# Patient Record
Sex: Male | Born: 1945
Health system: Southern US, Community
[De-identification: ages and names within clinical notes are randomized; demographics above are authoritative.]

## PROBLEM LIST (undated history)

## (undated) DIAGNOSIS — K219 Gastro-esophageal reflux disease without esophagitis: Secondary | ICD-10-CM

## (undated) DIAGNOSIS — F32A Depression, unspecified: Secondary | ICD-10-CM

## (undated) DIAGNOSIS — I2510 Atherosclerotic heart disease of native coronary artery without angina pectoris: Secondary | ICD-10-CM

## (undated) DIAGNOSIS — R9439 Abnormal result of other cardiovascular function study: Secondary | ICD-10-CM

## (undated) DIAGNOSIS — C801 Malignant (primary) neoplasm, unspecified: Secondary | ICD-10-CM

## (undated) DIAGNOSIS — I340 Nonrheumatic mitral (valve) insufficiency: Secondary | ICD-10-CM

## (undated) DIAGNOSIS — I10 Essential (primary) hypertension: Secondary | ICD-10-CM

## (undated) DIAGNOSIS — G4730 Sleep apnea, unspecified: Secondary | ICD-10-CM

## (undated) DIAGNOSIS — N183 Chronic kidney disease, stage 3 unspecified: Secondary | ICD-10-CM

## (undated) DIAGNOSIS — E782 Mixed hyperlipidemia: Secondary | ICD-10-CM

## (undated) DIAGNOSIS — E785 Hyperlipidemia, unspecified: Secondary | ICD-10-CM

## (undated) DIAGNOSIS — N200 Calculus of kidney: Secondary | ICD-10-CM

## (undated) DIAGNOSIS — R079 Chest pain, unspecified: Secondary | ICD-10-CM

## (undated) DIAGNOSIS — T4145XA Adverse effect of unspecified anesthetic, initial encounter: Secondary | ICD-10-CM

## (undated) DIAGNOSIS — F329 Major depressive disorder, single episode, unspecified: Secondary | ICD-10-CM

## (undated) DIAGNOSIS — Z87442 Personal history of urinary calculi: Secondary | ICD-10-CM

## (undated) HISTORY — DX: Essential (primary) hypertension: I10

## (undated) HISTORY — DX: Chest pain, unspecified: R07.9

## (undated) HISTORY — DX: Nonrheumatic mitral (valve) insufficiency: I34.0

## (undated) HISTORY — DX: Mixed hyperlipidemia: E78.2

## (undated) HISTORY — PX: CHOLECYSTECTOMY: SHX55

## (undated) HISTORY — DX: Atherosclerotic heart disease of native coronary artery without angina pectoris: I25.10

## (undated) HISTORY — DX: Abnormal result of other cardiovascular function study: R94.39

## (undated) HISTORY — DX: Major depressive disorder, single episode, unspecified: F32.9

## (undated) HISTORY — PX: EXTRACORPOREAL SHOCK WAVE LITHOTRIPSY: SHX1557

## (undated) HISTORY — PX: TONSILLECTOMY: SUR1361

## (undated) HISTORY — DX: Depression, unspecified: F32.A

## (undated) HISTORY — DX: Hyperlipidemia, unspecified: E78.5

---

## 1966-11-19 HISTORY — PX: FOREIGN BODY REMOVAL: SHX962

## 1999-11-20 DIAGNOSIS — C801 Malignant (primary) neoplasm, unspecified: Secondary | ICD-10-CM

## 1999-11-20 HISTORY — DX: Malignant (primary) neoplasm, unspecified: C80.1

## 2008-11-19 HISTORY — PX: COLONOSCOPY: SHX174

## 2012-01-03 DIAGNOSIS — Z0000 Encounter for general adult medical examination without abnormal findings: Secondary | ICD-10-CM | POA: Diagnosis not present

## 2012-01-03 DIAGNOSIS — B079 Viral wart, unspecified: Secondary | ICD-10-CM | POA: Diagnosis not present

## 2012-01-03 DIAGNOSIS — E785 Hyperlipidemia, unspecified: Secondary | ICD-10-CM | POA: Diagnosis not present

## 2012-03-17 DIAGNOSIS — L570 Actinic keratosis: Secondary | ICD-10-CM | POA: Diagnosis not present

## 2012-03-17 DIAGNOSIS — Z85828 Personal history of other malignant neoplasm of skin: Secondary | ICD-10-CM | POA: Diagnosis not present

## 2012-03-17 DIAGNOSIS — B079 Viral wart, unspecified: Secondary | ICD-10-CM | POA: Diagnosis not present

## 2012-04-08 DIAGNOSIS — B079 Viral wart, unspecified: Secondary | ICD-10-CM | POA: Diagnosis not present

## 2012-04-18 ENCOUNTER — Ambulatory Visit: Payer: Self-pay | Admitting: Emergency Medicine

## 2012-04-18 DIAGNOSIS — R55 Syncope and collapse: Secondary | ICD-10-CM | POA: Diagnosis not present

## 2012-04-18 DIAGNOSIS — N390 Urinary tract infection, site not specified: Secondary | ICD-10-CM | POA: Diagnosis not present

## 2012-04-18 LAB — URINALYSIS, COMPLETE
Bilirubin,UR: NEGATIVE
Glucose,UR: NEGATIVE mg/dL (ref 0–75)
Ketone: NEGATIVE
Leukocyte Esterase: NEGATIVE
Nitrite: NEGATIVE
Ph: 6.5 (ref 4.5–8.0)
Protein: 30
Specific Gravity: 1.02 (ref 1.003–1.030)

## 2012-04-18 LAB — COMPREHENSIVE METABOLIC PANEL
Albumin: 4.2 g/dL (ref 3.4–5.0)
Alkaline Phosphatase: 53 U/L (ref 50–136)
Anion Gap: 9 (ref 7–16)
BUN: 19 mg/dL — ABNORMAL HIGH (ref 7–18)
Bilirubin,Total: 0.8 mg/dL (ref 0.2–1.0)
Calcium, Total: 9.6 mg/dL (ref 8.5–10.1)
Chloride: 104 mmol/L (ref 98–107)
Co2: 26 mmol/L (ref 21–32)
Creatinine: 1.71 mg/dL — ABNORMAL HIGH (ref 0.60–1.30)
EGFR (African American): 47 — ABNORMAL LOW
EGFR (Non-African Amer.): 41 — ABNORMAL LOW
Glucose: 122 mg/dL — ABNORMAL HIGH (ref 65–99)
Osmolality: 281 (ref 275–301)
Potassium: 4.6 mmol/L (ref 3.5–5.1)
SGOT(AST): 38 U/L — ABNORMAL HIGH (ref 15–37)
SGPT (ALT): 45 U/L
Sodium: 139 mmol/L (ref 136–145)
Total Protein: 7.3 g/dL (ref 6.4–8.2)

## 2012-04-18 LAB — CBC WITH DIFFERENTIAL/PLATELET
Basophil #: 0 10*3/uL (ref 0.0–0.1)
Basophil %: 0.3 %
Eosinophil #: 0.4 10*3/uL (ref 0.0–0.7)
Eosinophil %: 5.5 %
HCT: 45.3 % (ref 40.0–52.0)
HGB: 15.5 g/dL (ref 13.0–18.0)
Lymphocyte #: 2.2 10*3/uL (ref 1.0–3.6)
Lymphocyte %: 27.5 %
MCH: 30 pg (ref 26.0–34.0)
MCHC: 34.3 g/dL (ref 32.0–36.0)
MCV: 88 fL (ref 80–100)
Monocyte #: 0.7 x10 3/mm (ref 0.2–1.0)
Monocyte %: 8.8 %
Neutrophil #: 4.6 10*3/uL (ref 1.4–6.5)
Neutrophil %: 57.9 %
Platelet: 156 10*3/uL (ref 150–440)
RBC: 5.18 10*6/uL (ref 4.40–5.90)
RDW: 13.2 % (ref 11.5–14.5)
WBC: 8 10*3/uL (ref 3.8–10.6)

## 2012-04-20 LAB — URINE CULTURE

## 2012-04-25 DIAGNOSIS — R55 Syncope and collapse: Secondary | ICD-10-CM | POA: Diagnosis not present

## 2012-04-25 DIAGNOSIS — E785 Hyperlipidemia, unspecified: Secondary | ICD-10-CM | POA: Diagnosis not present

## 2012-04-25 DIAGNOSIS — M549 Dorsalgia, unspecified: Secondary | ICD-10-CM | POA: Diagnosis not present

## 2012-04-29 DIAGNOSIS — B070 Plantar wart: Secondary | ICD-10-CM | POA: Diagnosis not present

## 2012-05-07 DIAGNOSIS — I119 Hypertensive heart disease without heart failure: Secondary | ICD-10-CM | POA: Diagnosis not present

## 2012-05-07 DIAGNOSIS — I519 Heart disease, unspecified: Secondary | ICD-10-CM | POA: Diagnosis not present

## 2012-05-07 DIAGNOSIS — E782 Mixed hyperlipidemia: Secondary | ICD-10-CM | POA: Diagnosis not present

## 2012-05-07 DIAGNOSIS — R55 Syncope and collapse: Secondary | ICD-10-CM | POA: Diagnosis not present

## 2012-05-16 DIAGNOSIS — R55 Syncope and collapse: Secondary | ICD-10-CM | POA: Diagnosis not present

## 2012-06-09 DIAGNOSIS — Z79899 Other long term (current) drug therapy: Secondary | ICD-10-CM | POA: Diagnosis not present

## 2012-06-09 DIAGNOSIS — E785 Hyperlipidemia, unspecified: Secondary | ICD-10-CM | POA: Diagnosis not present

## 2012-09-12 DIAGNOSIS — Z79899 Other long term (current) drug therapy: Secondary | ICD-10-CM | POA: Diagnosis not present

## 2012-09-12 DIAGNOSIS — E785 Hyperlipidemia, unspecified: Secondary | ICD-10-CM | POA: Diagnosis not present

## 2012-09-12 DIAGNOSIS — G4700 Insomnia, unspecified: Secondary | ICD-10-CM | POA: Diagnosis not present

## 2012-09-12 DIAGNOSIS — F329 Major depressive disorder, single episode, unspecified: Secondary | ICD-10-CM | POA: Diagnosis not present

## 2012-09-15 ENCOUNTER — Ambulatory Visit: Payer: Self-pay | Admitting: Family Medicine

## 2012-09-15 DIAGNOSIS — R109 Unspecified abdominal pain: Secondary | ICD-10-CM | POA: Diagnosis not present

## 2012-09-15 DIAGNOSIS — N200 Calculus of kidney: Secondary | ICD-10-CM | POA: Diagnosis not present

## 2012-09-15 DIAGNOSIS — N201 Calculus of ureter: Secondary | ICD-10-CM | POA: Diagnosis not present

## 2012-09-15 DIAGNOSIS — R310 Gross hematuria: Secondary | ICD-10-CM | POA: Diagnosis not present

## 2012-09-15 DIAGNOSIS — Z79899 Other long term (current) drug therapy: Secondary | ICD-10-CM | POA: Diagnosis not present

## 2012-09-15 DIAGNOSIS — E785 Hyperlipidemia, unspecified: Secondary | ICD-10-CM | POA: Diagnosis not present

## 2012-09-25 ENCOUNTER — Ambulatory Visit: Payer: Self-pay

## 2012-09-25 DIAGNOSIS — K5730 Diverticulosis of large intestine without perforation or abscess without bleeding: Secondary | ICD-10-CM | POA: Diagnosis not present

## 2012-09-25 DIAGNOSIS — N201 Calculus of ureter: Secondary | ICD-10-CM | POA: Diagnosis not present

## 2012-09-25 DIAGNOSIS — N1330 Unspecified hydronephrosis: Secondary | ICD-10-CM | POA: Diagnosis not present

## 2012-09-25 DIAGNOSIS — Z9089 Acquired absence of other organs: Secondary | ICD-10-CM | POA: Diagnosis not present

## 2012-09-25 DIAGNOSIS — Q619 Cystic kidney disease, unspecified: Secondary | ICD-10-CM | POA: Diagnosis not present

## 2012-09-30 DIAGNOSIS — N200 Calculus of kidney: Secondary | ICD-10-CM | POA: Diagnosis not present

## 2012-09-30 DIAGNOSIS — N201 Calculus of ureter: Secondary | ICD-10-CM | POA: Diagnosis not present

## 2012-10-02 ENCOUNTER — Ambulatory Visit: Payer: Self-pay | Admitting: Urology

## 2012-10-02 DIAGNOSIS — E785 Hyperlipidemia, unspecified: Secondary | ICD-10-CM | POA: Diagnosis not present

## 2012-10-02 DIAGNOSIS — Z79899 Other long term (current) drug therapy: Secondary | ICD-10-CM | POA: Diagnosis not present

## 2012-10-02 DIAGNOSIS — Z87891 Personal history of nicotine dependence: Secondary | ICD-10-CM | POA: Diagnosis not present

## 2012-10-02 DIAGNOSIS — R55 Syncope and collapse: Secondary | ICD-10-CM | POA: Diagnosis not present

## 2012-10-02 DIAGNOSIS — N200 Calculus of kidney: Secondary | ICD-10-CM | POA: Diagnosis not present

## 2012-10-02 DIAGNOSIS — N201 Calculus of ureter: Secondary | ICD-10-CM | POA: Diagnosis not present

## 2012-10-08 ENCOUNTER — Ambulatory Visit: Payer: Self-pay | Admitting: Urology

## 2012-10-08 DIAGNOSIS — N201 Calculus of ureter: Secondary | ICD-10-CM | POA: Diagnosis not present

## 2012-10-08 DIAGNOSIS — N200 Calculus of kidney: Secondary | ICD-10-CM | POA: Diagnosis not present

## 2012-10-08 DIAGNOSIS — N1330 Unspecified hydronephrosis: Secondary | ICD-10-CM | POA: Diagnosis not present

## 2012-10-14 DIAGNOSIS — N200 Calculus of kidney: Secondary | ICD-10-CM | POA: Diagnosis not present

## 2012-11-03 DIAGNOSIS — N200 Calculus of kidney: Secondary | ICD-10-CM | POA: Diagnosis not present

## 2013-03-04 DIAGNOSIS — N200 Calculus of kidney: Secondary | ICD-10-CM | POA: Diagnosis not present

## 2013-04-02 DIAGNOSIS — E860 Dehydration: Secondary | ICD-10-CM | POA: Diagnosis not present

## 2013-04-02 DIAGNOSIS — K5289 Other specified noninfective gastroenteritis and colitis: Secondary | ICD-10-CM | POA: Diagnosis not present

## 2013-04-28 DIAGNOSIS — I10 Essential (primary) hypertension: Secondary | ICD-10-CM | POA: Diagnosis not present

## 2013-04-28 DIAGNOSIS — R9431 Abnormal electrocardiogram [ECG] [EKG]: Secondary | ICD-10-CM | POA: Diagnosis not present

## 2013-04-28 DIAGNOSIS — E785 Hyperlipidemia, unspecified: Secondary | ICD-10-CM | POA: Diagnosis not present

## 2013-06-01 DIAGNOSIS — Z85828 Personal history of other malignant neoplasm of skin: Secondary | ICD-10-CM | POA: Diagnosis not present

## 2013-06-01 DIAGNOSIS — Z1283 Encounter for screening for malignant neoplasm of skin: Secondary | ICD-10-CM | POA: Diagnosis not present

## 2013-06-01 DIAGNOSIS — L570 Actinic keratosis: Secondary | ICD-10-CM | POA: Diagnosis not present

## 2013-07-07 DIAGNOSIS — Z125 Encounter for screening for malignant neoplasm of prostate: Secondary | ICD-10-CM | POA: Diagnosis not present

## 2013-07-07 DIAGNOSIS — N201 Calculus of ureter: Secondary | ICD-10-CM | POA: Diagnosis not present

## 2013-07-07 DIAGNOSIS — N200 Calculus of kidney: Secondary | ICD-10-CM | POA: Diagnosis not present

## 2013-07-29 DIAGNOSIS — L570 Actinic keratosis: Secondary | ICD-10-CM | POA: Diagnosis not present

## 2013-08-28 DIAGNOSIS — N200 Calculus of kidney: Secondary | ICD-10-CM | POA: Diagnosis not present

## 2013-11-05 DIAGNOSIS — R3129 Other microscopic hematuria: Secondary | ICD-10-CM | POA: Diagnosis not present

## 2013-11-05 DIAGNOSIS — N200 Calculus of kidney: Secondary | ICD-10-CM | POA: Diagnosis not present

## 2013-11-05 DIAGNOSIS — N400 Enlarged prostate without lower urinary tract symptoms: Secondary | ICD-10-CM | POA: Diagnosis not present

## 2013-11-10 ENCOUNTER — Ambulatory Visit: Payer: Self-pay | Admitting: Urology

## 2013-11-10 DIAGNOSIS — R319 Hematuria, unspecified: Secondary | ICD-10-CM | POA: Diagnosis not present

## 2013-11-10 DIAGNOSIS — N200 Calculus of kidney: Secondary | ICD-10-CM | POA: Diagnosis not present

## 2013-11-24 DIAGNOSIS — R3129 Other microscopic hematuria: Secondary | ICD-10-CM | POA: Diagnosis not present

## 2013-11-24 DIAGNOSIS — N200 Calculus of kidney: Secondary | ICD-10-CM | POA: Diagnosis not present

## 2013-12-01 DIAGNOSIS — L821 Other seborrheic keratosis: Secondary | ICD-10-CM | POA: Diagnosis not present

## 2013-12-01 DIAGNOSIS — L738 Other specified follicular disorders: Secondary | ICD-10-CM | POA: Diagnosis not present

## 2013-12-01 DIAGNOSIS — D485 Neoplasm of uncertain behavior of skin: Secondary | ICD-10-CM | POA: Diagnosis not present

## 2013-12-01 DIAGNOSIS — L570 Actinic keratosis: Secondary | ICD-10-CM | POA: Diagnosis not present

## 2014-01-19 DIAGNOSIS — E785 Hyperlipidemia, unspecified: Secondary | ICD-10-CM | POA: Diagnosis not present

## 2014-01-19 DIAGNOSIS — F329 Major depressive disorder, single episode, unspecified: Secondary | ICD-10-CM | POA: Diagnosis not present

## 2014-01-19 DIAGNOSIS — F3289 Other specified depressive episodes: Secondary | ICD-10-CM | POA: Diagnosis not present

## 2014-01-19 DIAGNOSIS — Z23 Encounter for immunization: Secondary | ICD-10-CM | POA: Diagnosis not present

## 2014-02-03 DIAGNOSIS — R319 Hematuria, unspecified: Secondary | ICD-10-CM | POA: Diagnosis not present

## 2014-03-26 DIAGNOSIS — R51 Headache: Secondary | ICD-10-CM | POA: Diagnosis not present

## 2014-04-20 DIAGNOSIS — I059 Rheumatic mitral valve disease, unspecified: Secondary | ICD-10-CM | POA: Diagnosis not present

## 2014-04-20 DIAGNOSIS — I340 Nonrheumatic mitral (valve) insufficiency: Secondary | ICD-10-CM

## 2014-04-20 DIAGNOSIS — R9439 Abnormal result of other cardiovascular function study: Secondary | ICD-10-CM | POA: Diagnosis not present

## 2014-04-20 DIAGNOSIS — E785 Hyperlipidemia, unspecified: Secondary | ICD-10-CM | POA: Diagnosis not present

## 2014-04-20 HISTORY — DX: Nonrheumatic mitral (valve) insufficiency: I34.0

## 2014-06-01 DIAGNOSIS — Z1283 Encounter for screening for malignant neoplasm of skin: Secondary | ICD-10-CM | POA: Diagnosis not present

## 2014-06-01 DIAGNOSIS — Z85828 Personal history of other malignant neoplasm of skin: Secondary | ICD-10-CM | POA: Diagnosis not present

## 2014-06-01 DIAGNOSIS — L570 Actinic keratosis: Secondary | ICD-10-CM | POA: Diagnosis not present

## 2014-07-21 DIAGNOSIS — H269 Unspecified cataract: Secondary | ICD-10-CM | POA: Diagnosis not present

## 2014-10-19 DIAGNOSIS — T8859XA Other complications of anesthesia, initial encounter: Secondary | ICD-10-CM

## 2014-10-19 HISTORY — PX: CARDIAC CATHETERIZATION: SHX172

## 2014-10-19 HISTORY — DX: Other complications of anesthesia, initial encounter: T88.59XA

## 2014-10-25 DIAGNOSIS — I340 Nonrheumatic mitral (valve) insufficiency: Secondary | ICD-10-CM | POA: Diagnosis not present

## 2014-10-25 DIAGNOSIS — R079 Chest pain, unspecified: Secondary | ICD-10-CM | POA: Diagnosis not present

## 2014-10-25 DIAGNOSIS — E782 Mixed hyperlipidemia: Secondary | ICD-10-CM | POA: Diagnosis not present

## 2014-11-03 DIAGNOSIS — I209 Angina pectoris, unspecified: Secondary | ICD-10-CM | POA: Diagnosis not present

## 2014-11-03 DIAGNOSIS — R9439 Abnormal result of other cardiovascular function study: Secondary | ICD-10-CM | POA: Diagnosis not present

## 2014-11-03 DIAGNOSIS — R079 Chest pain, unspecified: Secondary | ICD-10-CM

## 2014-11-03 DIAGNOSIS — E782 Mixed hyperlipidemia: Secondary | ICD-10-CM | POA: Diagnosis not present

## 2014-11-03 HISTORY — DX: Chest pain, unspecified: R07.9

## 2014-11-10 ENCOUNTER — Ambulatory Visit: Payer: Self-pay | Admitting: Internal Medicine

## 2014-11-10 DIAGNOSIS — E782 Mixed hyperlipidemia: Secondary | ICD-10-CM | POA: Diagnosis not present

## 2014-11-10 DIAGNOSIS — I10 Essential (primary) hypertension: Secondary | ICD-10-CM | POA: Diagnosis not present

## 2014-11-10 DIAGNOSIS — I25110 Atherosclerotic heart disease of native coronary artery with unstable angina pectoris: Secondary | ICD-10-CM | POA: Diagnosis not present

## 2014-11-10 DIAGNOSIS — I209 Angina pectoris, unspecified: Secondary | ICD-10-CM | POA: Diagnosis not present

## 2014-11-10 DIAGNOSIS — I25119 Atherosclerotic heart disease of native coronary artery with unspecified angina pectoris: Secondary | ICD-10-CM | POA: Diagnosis not present

## 2014-11-10 DIAGNOSIS — R9439 Abnormal result of other cardiovascular function study: Secondary | ICD-10-CM | POA: Diagnosis not present

## 2014-11-10 DIAGNOSIS — R9430 Abnormal result of cardiovascular function study, unspecified: Secondary | ICD-10-CM | POA: Diagnosis not present

## 2014-11-10 DIAGNOSIS — I25118 Atherosclerotic heart disease of native coronary artery with other forms of angina pectoris: Secondary | ICD-10-CM | POA: Diagnosis not present

## 2014-11-10 DIAGNOSIS — Z8249 Family history of ischemic heart disease and other diseases of the circulatory system: Secondary | ICD-10-CM | POA: Diagnosis not present

## 2014-11-10 DIAGNOSIS — I259 Chronic ischemic heart disease, unspecified: Secondary | ICD-10-CM | POA: Diagnosis not present

## 2014-11-10 DIAGNOSIS — I200 Unstable angina: Secondary | ICD-10-CM | POA: Diagnosis not present

## 2014-11-10 LAB — CK-MB: CK-MB: 1 ng/mL (ref 0.5–3.6)

## 2014-11-11 DIAGNOSIS — R9439 Abnormal result of other cardiovascular function study: Secondary | ICD-10-CM | POA: Diagnosis not present

## 2014-11-11 DIAGNOSIS — R51 Headache: Secondary | ICD-10-CM | POA: Diagnosis not present

## 2014-11-11 DIAGNOSIS — I259 Chronic ischemic heart disease, unspecified: Secondary | ICD-10-CM | POA: Diagnosis not present

## 2014-11-11 DIAGNOSIS — I10 Essential (primary) hypertension: Secondary | ICD-10-CM | POA: Diagnosis not present

## 2014-11-11 DIAGNOSIS — E782 Mixed hyperlipidemia: Secondary | ICD-10-CM | POA: Diagnosis not present

## 2014-11-11 DIAGNOSIS — I25118 Atherosclerotic heart disease of native coronary artery with other forms of angina pectoris: Secondary | ICD-10-CM | POA: Diagnosis not present

## 2014-11-11 DIAGNOSIS — Z8249 Family history of ischemic heart disease and other diseases of the circulatory system: Secondary | ICD-10-CM | POA: Diagnosis not present

## 2014-11-11 LAB — BASIC METABOLIC PANEL
ANION GAP: 11 (ref 7–16)
BUN: 15 mg/dL (ref 7–18)
CREATININE: 1.61 mg/dL — AB (ref 0.60–1.30)
Calcium, Total: 8.7 mg/dL (ref 8.5–10.1)
Chloride: 104 mmol/L (ref 98–107)
Co2: 21 mmol/L (ref 21–32)
EGFR (African American): 55 — ABNORMAL LOW
GFR CALC NON AF AMER: 45 — AB
GLUCOSE: 143 mg/dL — AB (ref 65–99)
OSMOLALITY: 275 (ref 275–301)
Potassium: 3.4 mmol/L — ABNORMAL LOW (ref 3.5–5.1)
Sodium: 136 mmol/L (ref 136–145)

## 2014-11-16 DIAGNOSIS — I10 Essential (primary) hypertension: Secondary | ICD-10-CM | POA: Diagnosis not present

## 2014-11-16 DIAGNOSIS — E782 Mixed hyperlipidemia: Secondary | ICD-10-CM | POA: Diagnosis not present

## 2014-11-16 DIAGNOSIS — I2510 Atherosclerotic heart disease of native coronary artery without angina pectoris: Secondary | ICD-10-CM | POA: Insufficient documentation

## 2014-11-16 HISTORY — DX: Atherosclerotic heart disease of native coronary artery without angina pectoris: I25.10

## 2014-11-16 HISTORY — DX: Essential (primary) hypertension: I10

## 2014-12-16 DIAGNOSIS — E782 Mixed hyperlipidemia: Secondary | ICD-10-CM | POA: Diagnosis not present

## 2014-12-16 DIAGNOSIS — I10 Essential (primary) hypertension: Secondary | ICD-10-CM | POA: Diagnosis not present

## 2014-12-16 DIAGNOSIS — I2510 Atherosclerotic heart disease of native coronary artery without angina pectoris: Secondary | ICD-10-CM | POA: Diagnosis not present

## 2014-12-23 DIAGNOSIS — E782 Mixed hyperlipidemia: Secondary | ICD-10-CM | POA: Diagnosis not present

## 2015-02-23 DIAGNOSIS — E784 Other hyperlipidemia: Secondary | ICD-10-CM | POA: Diagnosis not present

## 2015-02-23 DIAGNOSIS — N400 Enlarged prostate without lower urinary tract symptoms: Secondary | ICD-10-CM | POA: Diagnosis not present

## 2015-02-23 DIAGNOSIS — I2510 Atherosclerotic heart disease of native coronary artery without angina pectoris: Secondary | ICD-10-CM | POA: Diagnosis not present

## 2015-02-23 DIAGNOSIS — Z79899 Other long term (current) drug therapy: Secondary | ICD-10-CM | POA: Diagnosis not present

## 2015-02-23 DIAGNOSIS — Z955 Presence of coronary angioplasty implant and graft: Secondary | ICD-10-CM | POA: Diagnosis not present

## 2015-02-23 DIAGNOSIS — F339 Major depressive disorder, recurrent, unspecified: Secondary | ICD-10-CM | POA: Diagnosis not present

## 2015-02-23 DIAGNOSIS — Z0000 Encounter for general adult medical examination without abnormal findings: Secondary | ICD-10-CM | POA: Diagnosis not present

## 2015-02-23 DIAGNOSIS — Z1389 Encounter for screening for other disorder: Secondary | ICD-10-CM | POA: Diagnosis not present

## 2015-03-12 NOTE — Discharge Summary (Signed)
PATIENT NAME:  Luis Strong, Luis Strong MR#:  [id number] DATE OF BIRTH:  10-27-1946  DATE OF ADMISSION:  11/10/2014 DATE OF DISCHARGE:  11/11/2014  DISCHARGE DIAGNOSES:   1.  Stable  angina ischemic in nature.  2.  Coronary artery disease with stable angina.  3.  Mixed hyperlipidemia.  4.  Essential hypertension.   HISTORY OF PRESENT ILLNESS: This 69 year old male with mixed hyperlipidemia, essential hypertension on appropriate medications with progressive shortness of breath with physical activity as well as chest pressure consistent with ischemic chest discomfort and [place] class III angina for which he had a stress test, which was high risk, result specifically showing abnormal treadmill score, abnormal myocardial ischemia in the anterior distribution of the myocardium and underwent catheterization showing a 99% stenosis of the left anterior descending artery in the mid section who underwent a PCI and drug-eluting stent of the left anterior descending artery without complication. He was placed on appropriate medication management and ambulated without further chest discomfort and reached his maximal hospital benefit and was discharged to home in good condition.   DISCHARGE MEDICATIONS: Aspirin 81 mg each day, Plavix 75 mg each day, citalopram 10 mg each day, simvastatin 40 mg each day, and metoprolol succinate 25 mg each day.   DISCHARGE INSTRUCTIONS: He is to perform rehabilitation for which we have discussed and recommended at this time and follow up with Dr. Serafina Royals in one week.      ____________________________ Corey Skains, MD bjk:at D: 11/11/2014 07:48:01 ET T: 11/11/2014 09:42:57 ET JOB#: [id number]  cc: Corey Skains, MD, <Dictator> Corey Skains MD ELECTRONICALLY SIGNED 11/17/2014 13:47

## 2015-03-12 NOTE — Consult Note (Signed)
PATIENT NAME:  Luis Strong, Luis Strong MR#:  [id number] DATE OF BIRTH:  [birth year]  DATE OF CONSULTATION:  11/11/2014  REFERRING PHYSICIAN:   CONSULTING PHYSICIAN:  Leotis Pain, MD  REASON FOR CONSULTATION: Headache.   HISTORY OF PRESENT ILLNESS: This is a 69 year old gentleman with past medical history of hyperlipidemia, hypertension, admitted with angina and shortness of breath, found to have 99% stenosis of left anterior descending artery, status post PCI drug-eluting stent, placed on dual antiplatelet therapy, aspirin, and Plavix. Neurology consulted for headache. The patient stated the headache is improved, but it is located in the frontal region. Occasional nausea, no vomiting. No photophobia. No phonophobia. The patient has a history of headaches, but they are in frequent. As explained earlier, currently the symptoms have improved. The patient received Fioricet earlier.   LABORATORY: Has been reviewed.   REVIEW OF SYSTEMS: Positive only for headache.   NEUROLOGIC EXAMINATION: The patient tells me date, time, the name of the hospital, and the reason why he is in the hospital. Facial sensation intact. Facial motor is intact. Tongue is midline. Uvula elevates symmetrically. Shoulder shrug is intact. Motor strength 5/5 in bilateral upper and lower extremities. Sensation intact to light touch and temperature. Coordination intact. Finger-to-nose intact.   IMPRESSION: A 69 year old gentleman with past medical history of hypertension, hyperlipidemia, left anterior descending artery 99% stenosis, status post PCI and stenting, is now on Plavix and aspirin, complaining of a frontal headache with no radiation.   PLAN: At this point, I do not think the patient should receive Fioricet. Fioricet can cause a lot of rebound headaches. I agree with caffeine p.r.n. status post 2 grams of magnesium sulfate. At home if headaches persist, would start daily 500 mg of magnesium and a daily over-the-counter vitamin B  complex. No further imaging at this point. The case was discussed with the patient. It was a pleasure seeing this patient. Thank you and please call with any questions.    ____________________________ Leotis Pain, MD yz:at D: 11/11/2014 13:44:41 ET T: 11/11/2014 14:49:40 ET JOB#: [id number]  cc: Leotis Pain, MD, <Dictator> Leotis Pain MD ELECTRONICALLY SIGNED 11/17/2014 13:50

## 2015-04-12 DIAGNOSIS — E782 Mixed hyperlipidemia: Secondary | ICD-10-CM | POA: Diagnosis not present

## 2015-04-12 DIAGNOSIS — I10 Essential (primary) hypertension: Secondary | ICD-10-CM | POA: Diagnosis not present

## 2015-04-12 DIAGNOSIS — I2510 Atherosclerotic heart disease of native coronary artery without angina pectoris: Secondary | ICD-10-CM | POA: Diagnosis not present

## 2015-04-24 ENCOUNTER — Other Ambulatory Visit: Payer: Self-pay | Admitting: Family Medicine

## 2015-04-24 DIAGNOSIS — F329 Major depressive disorder, single episode, unspecified: Secondary | ICD-10-CM

## 2015-04-24 DIAGNOSIS — F32A Depression, unspecified: Secondary | ICD-10-CM

## 2015-04-26 ENCOUNTER — Other Ambulatory Visit: Payer: Self-pay

## 2015-04-26 DIAGNOSIS — F329 Major depressive disorder, single episode, unspecified: Secondary | ICD-10-CM

## 2015-04-26 DIAGNOSIS — F32A Depression, unspecified: Secondary | ICD-10-CM

## 2015-04-26 MED ORDER — CITALOPRAM HYDROBROMIDE 10 MG PO TABS: 10.0000 mg | ORAL_TABLET | Freq: Every day | ORAL | Status: DC

## 2015-05-31 DIAGNOSIS — Z872 Personal history of diseases of the skin and subcutaneous tissue: Secondary | ICD-10-CM | POA: Diagnosis not present

## 2015-05-31 DIAGNOSIS — Z1283 Encounter for screening for malignant neoplasm of skin: Secondary | ICD-10-CM | POA: Diagnosis not present

## 2015-05-31 DIAGNOSIS — Z09 Encounter for follow-up examination after completed treatment for conditions other than malignant neoplasm: Secondary | ICD-10-CM | POA: Diagnosis not present

## 2015-05-31 DIAGNOSIS — Z85828 Personal history of other malignant neoplasm of skin: Secondary | ICD-10-CM | POA: Diagnosis not present

## 2015-05-31 DIAGNOSIS — Z08 Encounter for follow-up examination after completed treatment for malignant neoplasm: Secondary | ICD-10-CM | POA: Diagnosis not present

## 2015-06-24 ENCOUNTER — Other Ambulatory Visit: Payer: Self-pay

## 2015-07-08 ENCOUNTER — Other Ambulatory Visit: Payer: Self-pay | Admitting: Family Medicine

## 2015-07-08 DIAGNOSIS — E785 Hyperlipidemia, unspecified: Secondary | ICD-10-CM

## 2015-08-18 DIAGNOSIS — I2510 Atherosclerotic heart disease of native coronary artery without angina pectoris: Secondary | ICD-10-CM | POA: Diagnosis not present

## 2015-08-18 DIAGNOSIS — I340 Nonrheumatic mitral (valve) insufficiency: Secondary | ICD-10-CM | POA: Diagnosis not present

## 2015-08-18 DIAGNOSIS — I10 Essential (primary) hypertension: Secondary | ICD-10-CM | POA: Diagnosis not present

## 2015-10-04 ENCOUNTER — Other Ambulatory Visit: Payer: Self-pay | Admitting: Family Medicine

## 2015-11-15 ENCOUNTER — Ambulatory Visit (INDEPENDENT_AMBULATORY_CARE_PROVIDER_SITE_OTHER): Payer: Medicare Other | Admitting: Family Medicine

## 2015-11-15 ENCOUNTER — Encounter: Payer: Self-pay | Admitting: Family Medicine

## 2015-11-15 VITALS — BP 120/80 | HR 76 | Ht 69.0 in | Wt 205.0 lb

## 2015-11-15 DIAGNOSIS — E782 Mixed hyperlipidemia: Secondary | ICD-10-CM | POA: Insufficient documentation

## 2015-11-15 DIAGNOSIS — I872 Venous insufficiency (chronic) (peripheral): Secondary | ICD-10-CM | POA: Diagnosis not present

## 2015-11-15 DIAGNOSIS — R9439 Abnormal result of other cardiovascular function study: Secondary | ICD-10-CM | POA: Insufficient documentation

## 2015-11-15 DIAGNOSIS — I83892 Varicose veins of left lower extremities with other complications: Secondary | ICD-10-CM

## 2015-11-15 HISTORY — DX: Mixed hyperlipidemia: E78.2

## 2015-11-15 HISTORY — DX: Abnormal result of other cardiovascular function study: R94.39
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
## 2015-11-15 NOTE — Patient Instructions (Signed)
Varicose Veins Varicose veins are veins that have become enlarged and twisted. They are usually seen in the legs but can occur in other parts of the body as well. CAUSES This condition is the result of valves in the veins not working properly. Valves in the veins help to return blood from the leg to the heart. If these valves are damaged, blood flows backward and backs up into the veins in the leg near the skin. This causes the veins to become larger. RISK FACTORS People who are on their feet a lot, who are pregnant, or who are overweight are more likely to develop varicose veins. SIGNS AND SYMPTOMS  Bulging, twisted-appearing, bluish veins, most commonly found on the legs.  Leg pain or a feeling of heaviness. These symptoms may be worse at the end of the day.  Leg swelling.  Changes in skin color. DIAGNOSIS A health care provider can usually diagnose varicose veins by examining your legs. Your health care provider may also recommend an ultrasound of your leg veins. TREATMENT Most varicose veins can be treated at home.However, other treatments are available for people who have persistent symptoms or want to improve the cosmetic appearance of the varicose veins. These treatment options include:  Sclerotherapy. A solution is injected into the vein to close it off.  Laser treatment. A laser is used to heat the vein to close it off.  Radiofrequency vein ablation. An electrical current produced by radio waves is used to close off the vein.  Phlebectomy. The vein is surgically removed through small incisions made over the varicose vein.  Vein ligation and stripping. The vein is surgically removed through incisions made over the varicose vein after the vein has been tied (ligated). HOME CARE INSTRUCTIONS  Do not stand or sit in one position for long periods of time. Do not sit with your legs crossed. Rest with your legs raised during the day.  Wear compression stockings as directed by your  health care provider. These stockings help to prevent blood clots and reduce swelling in your legs.  Do not wear other tight, encircling garments around your legs, pelvis, or waist.  Walk as much as possible to increase blood flow.  Raise the foot of your bed at night with 2-inch blocks.  If you get a cut in the skin over the vein and the vein bleeds, lie down with your leg raised and press on it with a clean cloth until the bleeding stops. Then place a bandage (dressing) on the cut. See your health care provider if it continues to bleed. SEEK MEDICAL CARE IF:  The skin around your ankle starts to break down.  You have pain, redness, tenderness, or hard swelling in your leg over a vein.  You are uncomfortable because of leg pain.   This information is not intended to replace advice given to you by your health care provider. Make sure you discuss any questions you have with your health care provider.   Document Released: 08/15/2005 Document Revised: 11/26/2014 Document Reviewed: 03/23/2014 Elsevier Interactive Patient Education 2016 [employer]  

## 2015-11-20 HISTORY — PX: KIDNEY STONE SURGERY: SHX686

## 2015-12-05 DIAGNOSIS — I2510 Atherosclerotic heart disease of native coronary artery without angina pectoris: Secondary | ICD-10-CM | POA: Diagnosis not present

## 2015-12-05 DIAGNOSIS — I872 Venous insufficiency (chronic) (peripheral): Secondary | ICD-10-CM | POA: Diagnosis not present

## 2015-12-05 DIAGNOSIS — I8312 Varicose veins of left lower extremity with inflammation: Secondary | ICD-10-CM | POA: Diagnosis not present

## 2015-12-05 DIAGNOSIS — M79609 Pain in unspecified limb: Secondary | ICD-10-CM | POA: Diagnosis not present

## 2015-12-22 ENCOUNTER — Other Ambulatory Visit: Payer: Self-pay | Admitting: Family Medicine

## 2016-01-01 ENCOUNTER — Other Ambulatory Visit: Payer: Self-pay | Admitting: Family Medicine

## 2016-01-24 DIAGNOSIS — I10 Essential (primary) hypertension: Secondary | ICD-10-CM | POA: Diagnosis not present

## 2016-01-24 DIAGNOSIS — I2510 Atherosclerotic heart disease of native coronary artery without angina pectoris: Secondary | ICD-10-CM | POA: Diagnosis not present

## 2016-01-24 DIAGNOSIS — E782 Mixed hyperlipidemia: Secondary | ICD-10-CM | POA: Diagnosis not present

## 2016-01-24 DIAGNOSIS — R9439 Abnormal result of other cardiovascular function study: Secondary | ICD-10-CM | POA: Diagnosis not present

## 2016-01-24 DIAGNOSIS — R000 Tachycardia, unspecified: Secondary | ICD-10-CM | POA: Diagnosis not present

## 2016-01-24 DIAGNOSIS — R079 Chest pain, unspecified: Secondary | ICD-10-CM | POA: Diagnosis not present

## 2016-01-24 DIAGNOSIS — I340 Nonrheumatic mitral (valve) insufficiency: Secondary | ICD-10-CM | POA: Diagnosis not present

## 2016-03-09 ENCOUNTER — Encounter: Payer: Self-pay | Admitting: Urology

## 2016-03-09 ENCOUNTER — Ambulatory Visit (INDEPENDENT_AMBULATORY_CARE_PROVIDER_SITE_OTHER): Payer: Medicare Other | Admitting: Urology

## 2016-03-09 ENCOUNTER — Other Ambulatory Visit: Payer: Self-pay

## 2016-03-09 VITALS — BP 110/73 | HR 73 | Ht 69.0 in | Wt 202.0 lb

## 2016-03-09 DIAGNOSIS — N200 Calculus of kidney: Secondary | ICD-10-CM

## 2016-03-09 LAB — URINALYSIS, COMPLETE
Bilirubin, UA: NEGATIVE
Glucose, UA: NEGATIVE
Ketones, UA: NEGATIVE
Leukocytes, UA: NEGATIVE
NITRITE UA: NEGATIVE
PH UA: 5 (ref 5.0–7.5)
Protein, UA: NEGATIVE
Specific Gravity, UA: 1.015 (ref 1.005–1.030)
UUROB: 0.2 mg/dL (ref 0.2–1.0)

## 2016-03-09 LAB — MICROSCOPIC EXAMINATION
BACTERIA UA: NONE SEEN
EPITHELIAL CELLS (NON RENAL): NONE SEEN /HPF (ref 0–10)

## 2016-03-09 MED ORDER — TAMSULOSIN HCL 0.4 MG PO CAPS: 0.4000 mg | ORAL_CAPSULE | Freq: Every day | ORAL | Status: DC

## 2016-03-09 MED ORDER — OXYCODONE-ACETAMINOPHEN 5-325 MG PO TABS: 1.0000 | ORAL_TABLET | ORAL | Status: DC | PRN

## 2016-03-09 NOTE — Progress Notes (Signed)
03/09/2016 10:56 AM   Luis Strong Feb 01, 1946 [id number]  Referring provider: No referring provider defined for this encounter.  Chief Complaint  Patient presents with  . Nephrolithiasis    HPI: The patient is a 70 year old male who has a past medical history significant for nephrolithiasis who presents today with a chief complaint of new onset pain similar to his previous stones.  The patient notes a one-week history of left flank pain. He feels it is exactly the same type of pain as he has experienced in the past with stones. His passed multiple stones. His of both lithotripsy and ureteroscopy multiple times. He did note that he had nonobstructing stones as of a few years ago. He's had no nausea or vomiting. He spends multiple well-controlled with some Percocet. He's had no fevers or chills. His urinalysis today is clean.   PMH: Past Medical History  Diagnosis Date  . Hyperlipidemia   . Hypertension   . Depression     Surgical History: Past Surgical History  Procedure Laterality Date  . Cardiac catheterization  10/2014  . Colonoscopy  2010    normal    Home Medications:    Medication List       This list is accurate as of: 03/09/16 10:56 AM.  Always use your most recent med list.               aspirin 81 MG chewable tablet  Chew 1 tablet by mouth daily.     citalopram 10 MG tablet  Commonly known as:  CELEXA  TAKE 1 TABLET DAILY     metoprolol succinate 25 MG 24 hr tablet  Commonly known as:  TOPROL-XL     oxyCODONE-acetaminophen 5-325 MG tablet  Commonly known as:  ROXICET  Take 1-2 tablets by mouth every 4 (four) hours as needed for severe pain.     simvastatin 40 MG tablet  Commonly known as:  ZOCOR  TAKE 1 TABLET DAILY     tamsulosin 0.4 MG Caps capsule  Commonly known as:  FLOMAX  Take 1 capsule (0.4 mg total) by mouth daily.        Allergies:  Allergies  Allergen Reactions  . Capsaicin     Family History: History reviewed. No  pertinent family history.  Social History:  reports that he has never smoked. He does not have any smokeless tobacco history on file. He reports that he drinks alcohol. He reports that he does not use illicit drugs.  ROS: UROLOGY Frequent Urination?: No Hard to postpone urination?: No Burning/pain with urination?: No Get up at night to urinate?: No Leakage of urine?: No Urine stream starts and stops?: No Trouble starting stream?: No Do you have to strain to urinate?: No Blood in urine?: No Urinary tract infection?: No Sexually transmitted disease?: No Injury to kidneys or bladder?: No Painful intercourse?: No Weak stream?: No Erection problems?: No Penile pain?: No  Gastrointestinal Nausea?: No Vomiting?: No Indigestion/heartburn?: Yes Diarrhea?: No Constipation?: No  Constitutional Fever: No Night sweats?: No Weight loss?: No Fatigue?: No  Skin Skin rash/lesions?: No Itching?: No  Eyes Blurred vision?: No Double vision?: No  Ears/Nose/Throat Sore throat?: No Sinus problems?: No  Hematologic/Lymphatic Swollen glands?: No Easy bruising?: No  Cardiovascular Leg swelling?: Yes Chest pain?: No  Respiratory Cough?: No Shortness of breath?: No  Endocrine Excessive thirst?: No  Musculoskeletal Back pain?: Yes Joint pain?: No  Neurological Headaches?: No Dizziness?: No  Psychologic Depression?: No Anxiety?: No  Physical Exam: BP  110/73 mmHg  Pulse 73  Ht 5\' 9"  (1.753 m)  Wt 202 lb (91.627 kg)  BMI 29.82 kg/m2  Constitutional:  Alert and oriented, No acute distress. HEENT: [place] AT, moist mucus membranes.  Trachea midline, no masses. Cardiovascular: No clubbing, cyanosis, or edema. Respiratory: Normal respiratory effort, no increased work of breathing. GI: Abdomen is soft, nontender, nondistended, no abdominal masses GU: No CVA tenderness. Mild left CVA tenderness to palpation. Normal phallus. Testicles descended equally bilaterally. Skin: No  rashes, bruises or suspicious lesions. Lymph: No cervical or inguinal adenopathy. Neurologic: Grossly intact, no focal deficits, moving all 4 extremities. Psychiatric: Normal mood and affect.  Laboratory Data: Lab Results  Component Value Date   WBC 8.0 04/18/2012   HGB 15.5 04/18/2012   HCT 45.3 04/18/2012   MCV 88 04/18/2012   PLT 156 04/18/2012    Lab Results  Component Value Date   CREATININE 1.61* 11/11/2014    No results found for: PSA  No results found for: TESTOSTERONE  No results found for: HGBA1C  Urinalysis    Component Value Date/Time   COLORURINE YELLOW 04/18/2012 1323   APPEARANCEUR HAZY 04/18/2012 1323   LABSPEC 1.020 04/18/2012 1323   PHURINE 6.5 04/18/2012 1323   GLUCOSEU NEGATIVE 04/18/2012 1323   HGBUR TRACE 04/18/2012 1323   [place] 04/18/2012 1323   KETONESUR NEGATIVE 04/18/2012 1323   PROTEINUR 30 mg/dL 04/18/2012 1323   NITRITE NEGATIVE 04/18/2012 [place] 04/18/2012 1323     Assessment & Plan:    Given the patient's history and symptoms, he likely has a left ureteral stone. We will obtain a CT scan to further delineate his stone burden. He will follow-up after CT scan. He was encouraged to contact the office if you develop uncontrolled pain or fever prior to his follow-up.  1. Nephrolithiasis -Medical expulsive therapy with Percocet, Flomax, and straining of urine. -Obtain CT stone protocol to assess stone burden -Follow up after above   Return for after CT stone protocol.  Nickie Retort, MD  [facility] [address] [place], [place] [address] [phone]

## 2016-03-15 ENCOUNTER — Ambulatory Visit
Admission: RE | Admit: 2016-03-15 | Discharge: 2016-03-15 | Disposition: A | Discharge status: Home / Self Care | Payer: Medicare Other | Source: Ambulatory Visit | Attending: Urology | Admitting: Urology

## 2016-03-15 DIAGNOSIS — N134 Hydroureter: Secondary | ICD-10-CM | POA: Diagnosis not present

## 2016-03-15 DIAGNOSIS — N202 Calculus of kidney with calculus of ureter: Secondary | ICD-10-CM | POA: Diagnosis not present

## 2016-03-15 DIAGNOSIS — N200 Calculus of kidney: Secondary | ICD-10-CM | POA: Diagnosis not present

## 2016-03-15 DIAGNOSIS — N201 Calculus of ureter: Secondary | ICD-10-CM | POA: Diagnosis not present

## 2016-03-16 ENCOUNTER — Telehealth: Payer: Self-pay

## 2016-03-16 DIAGNOSIS — N200 Calculus of kidney: Secondary | ICD-10-CM

## 2016-03-16 NOTE — Telephone Encounter (Signed)
Please let patient he has a 5 mm stone in his left ureter on his recent CT. He has a good chance of passing this stone on his own. He should continue flomax and straining his urine. He should f/u as previously scheduled.         Can we add an KUB to be performed on the day of follow up as well? Thanks        Spoke with pt in reference to CT results and KUB. Pt voiced understanding and stated he would continue to take flomax and screen his urine.

## 2016-03-19 ENCOUNTER — Other Ambulatory Visit: Payer: Self-pay | Admitting: Family Medicine

## 2016-03-29 ENCOUNTER — Encounter: Payer: Self-pay | Admitting: Urology

## 2016-03-29 ENCOUNTER — Ambulatory Visit (INDEPENDENT_AMBULATORY_CARE_PROVIDER_SITE_OTHER): Payer: Medicare Other | Admitting: Urology

## 2016-03-29 VITALS — BP 114/69 | HR 69 | Ht 69.0 in | Wt 198.8 lb

## 2016-03-29 DIAGNOSIS — I2510 Atherosclerotic heart disease of native coronary artery without angina pectoris: Secondary | ICD-10-CM | POA: Diagnosis not present

## 2016-03-29 DIAGNOSIS — I10 Essential (primary) hypertension: Secondary | ICD-10-CM | POA: Diagnosis not present

## 2016-03-29 DIAGNOSIS — E782 Mixed hyperlipidemia: Secondary | ICD-10-CM | POA: Diagnosis not present

## 2016-03-29 DIAGNOSIS — N200 Calculus of kidney: Secondary | ICD-10-CM

## 2016-03-29 NOTE — Progress Notes (Signed)
03/29/2016 11:24 AM   Luis Strong [birth year] [id number]  Referring provider: Juline Patch, MD [address] [place], [place] [address]  Chief Complaint  Patient presents with  . Follow-up    diagnostic study CT results     HPI: The patient is a 70 year old gentleman recent presented with a chief complaint of patella to be a left ureteral stone like his previous stones. This was confirmed to be a proximal left 5 mm ureteral stone on CT scan. Also had bilateral nonobstructing nephrolithiasis with the largest being 8 mm on the left.    PMH: Past Medical History  Diagnosis Date  . Hyperlipidemia   . Hypertension   . Depression   . Arteriosclerosis of coronary artery 11/16/2014    Overview:  Sp pci stetn of lad 2015   . Benign essential HTN 11/16/2014  . MI (mitral incompetence) 04/20/2014  . Abnormal cardiovascular stress test 11/15/2015  . Chest pain 11/03/2014  . Combined fat and carbohydrate induced hyperlipemia 11/15/2015    Surgical History: Past Surgical History  Procedure Laterality Date  . Cardiac catheterization  10/2014  . Colonoscopy  2010    normal    Home Medications:    Medication List       This list is accurate as of: 03/29/16 11:24 AM.  Always use your most recent med list.               aspirin 81 MG chewable tablet  Chew 1 tablet by mouth daily.     citalopram 10 MG tablet  Commonly known as:  CELEXA  TAKE 1 TABLET DAILY     metoprolol succinate 25 MG 24 hr tablet  Commonly known as:  TOPROL-XL     oxyCODONE-acetaminophen 5-325 MG tablet  Commonly known as:  ROXICET  Take 1-2 tablets by mouth every 4 (four) hours as needed for severe pain.     simvastatin 40 MG tablet  Commonly known as:  ZOCOR  TAKE 1 TABLET DAILY     tamsulosin 0.4 MG Caps capsule  Commonly known as:  FLOMAX  Take 1 capsule (0.4 mg total) by mouth daily.        Allergies:  Allergies  Allergen Reactions  . Capsaicin     Family  History: No family history on file.  Social History:  reports that he has never smoked. He does not have any smokeless tobacco history on file. He reports that he drinks alcohol. He reports that he does not use illicit drugs.  ROS:                                        Physical Exam: BP 114/69 mmHg  Pulse 69  Ht 5\' 9"  (1.753 m)  Wt 198 lb 12.8 oz (90.175 kg)  BMI 29.34 kg/m2  Constitutional:  Alert and oriented, No acute distress. HEENT: [place] AT, moist mucus membranes.  Trachea midline, no masses. Cardiovascular: No clubbing, cyanosis, or edema. Respiratory: Normal respiratory effort, no increased work of breathing. GI: Abdomen is soft, nontender, nondistended, no abdominal masses GU: No CVA tenderness.  Skin: No rashes, bruises or suspicious lesions. Lymph: No cervical or inguinal adenopathy. Neurologic: Grossly intact, no focal deficits, moving all 4 extremities. Psychiatric: Normal mood and affect.  Laboratory Data: Lab Results  Component Value Date   WBC 8.0 04/18/2012   HGB 15.5 04/18/2012   HCT 45.3  04/18/2012   MCV 88 04/18/2012   PLT 156 04/18/2012    Lab Results  Component Value Date   CREATININE 1.61* 11/11/2014    No results found for: PSA  No results found for: TESTOSTERONE  No results found for: HGBA1C  Urinalysis    Component Value Date/Time   COLORURINE YELLOW 04/18/2012 1323   APPEARANCEUR Clear 03/09/2016 0929   APPEARANCEUR HAZY 04/18/2012 1323   LABSPEC 1.020 04/18/2012 1323   PHURINE 6.5 04/18/2012 1323   GLUCOSEU Negative 03/09/2016 0929   GLUCOSEU NEGATIVE 04/18/2012 1323   HGBUR TRACE 04/18/2012 1323   BILIRUBINUR Negative 03/09/2016 0929   BILIRUBINUR NEGATIVE 04/18/2012 1323   KETONESUR NEGATIVE 04/18/2012 1323   PROTEINUR Negative 03/09/2016 0929   PROTEINUR 30 mg/dL 04/18/2012 1323   NITRITE Negative 03/09/2016 0929   NITRITE NEGATIVE 04/18/2012 1323   LEUKOCYTESUR Negative 03/09/2016 0929    LEUKOCYTESUR NEGATIVE 04/18/2012 1323    Pertinent Imaging:  CLINICAL DATA: Left-sided flank pain for 1 week. History of microscopic hematuria. History of renal stones with lithotripsy 3 times in the past. No known injury. History coronary stent placement.  EXAM: CT ABDOMEN AND PELVIS WITHOUT CONTRAST  TECHNIQUE: Multidetector CT imaging of the abdomen and pelvis was performed following the standard protocol without IV contrast.  COMPARISON: 11/10/2013; 09/25/2012  FINDINGS: Lower chest: Limited visualization of lower thorax demonstrates minimal dependent subpleural ground-glass atelectasis, right greater than left. No focal airspace opacities. No pleural effusion. Normal heart size. Coronary artery calcifications. No pericardial effusion.  Hepatobiliary: Normal hepatic contour. There are a peripherally calcified granuloma is within the subcapsular aspect of the right lobe of the liver (images for ran 16, series 2, likely the sequela of prior glomus infection. Post cholecystectomy. No ascites.  Pancreas: Normal noncontrast appearance of the pancreas  Spleen: Normal noncontrast appearance of the spleen  Adrenals/Urinary Tract: There is a punctate (approximately 5 mm) stone within the superior aspect of the left ureter which results in mild upstream left-sided ureterectasis and pelvicaliectasis.  Note is made of at least 11 additional nonobstructing right-sided renal stones with dominant nonobstructing stone within an inferior pole calyx measuring approximately 6 mm in diameter (coronal image 62, series 5).  Note is made of at least 12 punctate nonobstructing right-sided renal stones with dominant nonobstructing stone within in the interpolar aspect the right kidney measuring approximately 8 mm in diameter (coronal image 65, series 5). No renal stones are seen along the expected course of the right ureter or the urinary bladder. Normal noncontrast appearance  of the urinary bladder given degree distention.  Previously characterized approximately 2.8 cm right-sided renal cyst as well as an approximately 2.1 cm left-sided renal cyst appear morphologically unchanged since the 10/2013 examination.  Normal noncontrast appearance of the bilateral adrenal glands.  Stomach/Bowel: Moderate colonic stool burden without evidence of enteric obstruction. Diverticulosis without evidence of diverticulitis on this noncontrast examination. Several prominent phleboliths are seen within the lower pelvis bilaterally. A peripherally calcified approximately 0.9 x 1.6 cm structure within the right hemipelvis may represent either a calcified diverticulum, a prominent phlebolith versus a displaced gallstone during prior cholecystectomy.  Normal noncontrast appearance of the terminal ileum and retrocecal appendix. No pneumoperitoneum, pneumatosis or portal venous gas.  Vascular/Lymphatic: Minimal amount of eccentric calcified plaque within a normal caliber abdominal aorta.  Scattered periesophageal lymph nodes are prominent though morphologically similar to the 09/2012 examination with index paraesophageal lymph node measuring 0.7 cm in greatest short axis diameter (image 7, series 2. Likewise, porta hepatis  lymph nodes are unchanged since the 09/2012 examination with index aortic caval lymph node measuring 1 cm in greatest short axis diameter (image 26, series 2). No bulky retroperitoneal, mesenteric, pelvic or inguinal lymphadenopathy on this noncontrast examination.  Reproductive: Normal noncontrast appearance of the prostate gland. No free fluid in the pelvic cul-de-sac.  Other: Regional soft tissues appear normal.  Musculoskeletal: No acute or aggressive osseous abnormalities. Mild-to-moderate multilevel lumbar spine DDD, worse at L2-L3 with disc space height loss, endplate irregularity and sclerosis. Stigmata DISH within the caudal aspect of  the thoracic spine.  IMPRESSION: 1. Punctate (approximately 5 mm) stone within the superior aspect the left ureter results in mild upstream ureterectasis and pelvicaliectasis. 2. Rather extensive bilateral nonobstructing nephrolithiasis as detailed above. No evidence of right-sided urinary obstruction. These results will be called to the ordering clinician or representative by the Radiologist Assistant, and communication documented in the PACS or zVision Dashboard.   Electronically Signed  By: Sandi Mariscal M.D.  On: 03/15/2016 14:44      Assessment & Plan:    1. Left ureteral stone 5 mm -Continue medical expulsive therapy with Flomax and pain control. Obtain KUB in 2 weeks prior to follow-up. He will alert [place] if he develops unexplained fevers or if his pain becomes unbearable.  2. Bilateral nephrolithiasis The patient has a millimeters stone on the right there is nonobstructing as well as multiple stones bilaterally. We briefly discussed ESWL and ureteroscopy which would not occur until his current stone is passed. He more likely benefit from ureteroscopy due to the multiple stones as ureteroscopy would give him a better chance of being stone free with fewer procedures.  Return in about 2 weeks (around 04/12/2016) for with KUB prior.  Nickie Retort, MD  [facility] [address] [place], [place] [address] [phone]

## 2016-03-30 ENCOUNTER — Other Ambulatory Visit: Payer: Self-pay | Admitting: Family Medicine

## 2016-03-30 DIAGNOSIS — B078 Other viral warts: Secondary | ICD-10-CM | POA: Diagnosis not present

## 2016-03-30 DIAGNOSIS — R208 Other disturbances of skin sensation: Secondary | ICD-10-CM | POA: Diagnosis not present

## 2016-03-30 DIAGNOSIS — Z789 Other specified health status: Secondary | ICD-10-CM | POA: Diagnosis not present

## 2016-04-12 ENCOUNTER — Ambulatory Visit (INDEPENDENT_AMBULATORY_CARE_PROVIDER_SITE_OTHER): Payer: Medicare Other | Admitting: Urology

## 2016-04-12 ENCOUNTER — Ambulatory Visit
Admission: RE | Admit: 2016-04-12 | Discharge: 2016-04-12 | Disposition: A | Discharge status: Home / Self Care | Payer: Medicare Other | Source: Ambulatory Visit | Attending: Urology | Admitting: Urology

## 2016-04-12 ENCOUNTER — Encounter: Payer: Self-pay | Admitting: Urology

## 2016-04-12 VITALS — BP 123/78 | HR 66 | Ht 69.0 in | Wt 201.4 lb

## 2016-04-12 DIAGNOSIS — N200 Calculus of kidney: Secondary | ICD-10-CM | POA: Diagnosis not present

## 2016-04-12 DIAGNOSIS — N202 Calculus of kidney with calculus of ureter: Secondary | ICD-10-CM | POA: Insufficient documentation

## 2016-04-12 NOTE — Progress Notes (Signed)
04/12/2016 2:51 PM   Luis Strong December 20, 1945 [id number]  Referring provider: Juline Patch, MD [address] [place],  [address]  Chief Complaint  Patient presents with  . Follow-up    Nephrolithoasis    HPI: The patient is a 70 year old gentleman recent presented with a chief complaint of patella to be a left ureteral stone like his previous stones. This was confirmed to be a proximal left 5 mm ureteral stone on CT scan. Also had bilateral nonobstructing nephrolithiasis with the largest being 8 mm on the left.   On today's KUB, does not appear that the left ureteral stone is visible on my read. It was visible on the original scout film from the aforementioned CT scan.  Last 3 days, the patient has not had any stone pain. He feels he may have passed a it at the gym. He has been straining his urine but not  100% of the time. He is interested in definitive stone management of his bilateral nonobstructing stones.  PMH: Past Medical History  Diagnosis Date  . Hyperlipidemia   . Hypertension   . Depression   . Arteriosclerosis of coronary artery 11/16/2014    Overview:  Sp pci stetn of lad 2015   . Benign essential HTN 11/16/2014  . MI (mitral incompetence) 04/20/2014  . Abnormal cardiovascular stress test 11/15/2015  . Chest pain 11/03/2014  . Combined fat and carbohydrate induced hyperlipemia 11/15/2015    Surgical History: Past Surgical History  Procedure Laterality Date  . Cardiac catheterization  10/2014  . Colonoscopy  2010    normal    Home Medications:    Medication List       This list is accurate as of: 04/12/16  2:51 PM.  Always use your most recent med list.               aspirin 81 MG chewable tablet  Chew 1 tablet by mouth daily.     citalopram 10 MG tablet  Commonly known as:  CELEXA  TAKE 1 TABLET DAILY     metoprolol succinate 25 MG 24 hr tablet  Commonly known as:  TOPROL-XL     oxyCODONE-acetaminophen 5-325 MG  tablet  Commonly known as:  ROXICET  Take 1-2 tablets by mouth every 4 (four) hours as needed for severe pain.     simvastatin 40 MG tablet  Commonly known as:  ZOCOR  TAKE 1 TABLET DAILY     tamsulosin 0.4 MG Caps capsule  Commonly known as:  FLOMAX  Take 1 capsule (0.4 mg total) by mouth daily.        Allergies:  Allergies  Allergen Reactions  . Capsaicin     Family History: No family history on file.  Social History:  reports that he has never smoked. He does not have any smokeless tobacco history on file. He reports that he drinks alcohol. He reports that he does not use illicit drugs.  ROS: UROLOGY Burning/pain with urination?: No Urine stream starts and stops?: No Trouble starting stream?: No Do you have to strain to urinate?: No Blood in urine?: No Urinary tract infection?: No Sexually transmitted disease?: No Injury to kidneys or bladder?: No Painful intercourse?: No Weak stream?: No Erection problems?: No Penile pain?: No                                      Physical  Exam: There were no vitals taken for this visit.  Constitutional:  Alert and oriented, No acute distress. HEENT: [place] AT, moist mucus membranes.  Trachea midline, no masses. Cardiovascular: No clubbing, cyanosis, or edema. Respiratory: Normal respiratory effort, no increased work of breathing. GI: Abdomen is soft, nontender, nondistended, no abdominal masses GU: No CVA tenderness.  Skin: No rashes, bruises or suspicious lesions. Lymph: No cervical or inguinal adenopathy. Neurologic: Grossly intact, no focal deficits, moving all 4 extremities. Psychiatric: Normal mood and affect.  Laboratory Data: Lab Results  Component Value Date   WBC 8.0 04/18/2012   HGB 15.5 04/18/2012   HCT 45.3 04/18/2012   MCV 88 04/18/2012   PLT 156 04/18/2012    Lab Results  Component Value Date   CREATININE 1.61* 11/11/2014    No results found for: PSA  No results found for:  TESTOSTERONE  No results found for: HGBA1C  Urinalysis    Component Value Date/Time   COLORURINE YELLOW 04/18/2012 1323   APPEARANCEUR Clear 03/09/2016 0929   APPEARANCEUR HAZY 04/18/2012 1323   LABSPEC 1.020 04/18/2012 1323   PHURINE 6.5 04/18/2012 1323   GLUCOSEU Negative 03/09/2016 0929   GLUCOSEU NEGATIVE 04/18/2012 1323   HGBUR TRACE 04/18/2012 1323   BILIRUBINUR Negative 03/09/2016 0929   BILIRUBINUR NEGATIVE 04/18/2012 1323   KETONESUR NEGATIVE 04/18/2012 1323   PROTEINUR Negative 03/09/2016 0929   PROTEINUR 30 mg/dL 04/18/2012 1323   NITRITE Negative 03/09/2016 0929   NITRITE NEGATIVE 04/18/2012 1323   LEUKOCYTESUR Negative 03/09/2016 0929   LEUKOCYTESUR NEGATIVE 04/18/2012 1323     Assessment & Plan:   1. Left ureteral stone 5 mm -Appears to have passed  2. Bilateral nephrolithiasis I discussed with the patient is wife the multiple treatment options for his bilateral nonobstructing stones. We discussed both lithotripsy and ureteroscopy. I feel that he is a better candidate for ureteroscopy as this gives him the highest likelihood of being stone free after only one procedure. We'll plan for bilateral ureteroscopy with laser lithotripsy. I will like to start the procedure was shooting a left retrograde pyelogram to confirm that his left ureteral stone is gone as it appears to be on KUB and on clinical exam. The next step will be to start on the right sinuses highest stone burden as well as large stones in the right kidney. He understands the risks, benefits, indications procedure. He understands this may take more than one procedure. He understands he will have bilateral ureteral stents following the procedure. He understands the risks also include bleeding and infection as well as intraoperative misadventure. All questions were answered and the patient has agreed to proceed.   Nickie Retort, MD  [facility] [address], [place], [place] [address] [phone]

## 2016-04-13 ENCOUNTER — Telehealth: Payer: Self-pay | Admitting: Radiology

## 2016-04-13 LAB — URINALYSIS, COMPLETE
Bilirubin, UA: NEGATIVE
GLUCOSE, UA: NEGATIVE
KETONES UA: NEGATIVE
NITRITE UA: NEGATIVE
PROTEIN UA: NEGATIVE
SPEC GRAV UA: 1.015 (ref 1.005–1.030)
UUROB: 0.2 mg/dL (ref 0.2–1.0)
pH, UA: 5.5 (ref 5.0–7.5)

## 2016-04-13 LAB — MICROSCOPIC EXAMINATION: Bacteria, UA: NONE SEEN

## 2016-04-13 NOTE — Telephone Encounter (Signed)
Notified pt's wife of surgery scheduled 05/09/16, pre-admit testing appt on 04/25/16 @9 :45 & to call day prior to surgery for arrival time to SDS. Wife voices understanding.

## 2016-04-18 NOTE — Telephone Encounter (Signed)
Advised pt to hold ASA 81mg  beginning 05/02/16 per Dr Alveria Apley instruction. Pt voices understanding.

## 2016-04-25 ENCOUNTER — Encounter
Admission: RE | Admit: 2016-04-25 | Discharge: 2016-04-25 | Disposition: A | Discharge status: Home / Self Care | Payer: Medicare Other | Source: Ambulatory Visit | Attending: Urology | Admitting: Urology

## 2016-04-25 DIAGNOSIS — E785 Hyperlipidemia, unspecified: Secondary | ICD-10-CM | POA: Insufficient documentation

## 2016-04-25 DIAGNOSIS — Z87442 Personal history of urinary calculi: Secondary | ICD-10-CM | POA: Insufficient documentation

## 2016-04-25 DIAGNOSIS — I10 Essential (primary) hypertension: Secondary | ICD-10-CM | POA: Diagnosis not present

## 2016-04-25 DIAGNOSIS — I340 Nonrheumatic mitral (valve) insufficiency: Secondary | ICD-10-CM | POA: Insufficient documentation

## 2016-04-25 DIAGNOSIS — Z01812 Encounter for preprocedural laboratory examination: Secondary | ICD-10-CM | POA: Insufficient documentation

## 2016-04-25 DIAGNOSIS — I2510 Atherosclerotic heart disease of native coronary artery without angina pectoris: Secondary | ICD-10-CM | POA: Insufficient documentation

## 2016-04-25 DIAGNOSIS — Z79899 Other long term (current) drug therapy: Secondary | ICD-10-CM | POA: Diagnosis not present

## 2016-04-25 HISTORY — DX: Adverse effect of unspecified anesthetic, initial encounter: T41.45XA

## 2016-04-25 HISTORY — DX: Malignant (primary) neoplasm, unspecified: C80.1

## 2016-04-25 HISTORY — DX: Calculus of kidney: N20.0

## 2016-04-25 LAB — DIFFERENTIAL
Basophils Absolute: 0.1 K/uL (ref 0–0.1)
Basophils Relative: 1 %
Eosinophils Absolute: 0.5 K/uL (ref 0–0.7)
Eosinophils Relative: 6 %
Lymphocytes Relative: 16 %
Lymphs Abs: 1.4 K/uL (ref 1.0–3.6)
Monocytes Absolute: 0.7 K/uL (ref 0.2–1.0)
Monocytes Relative: 8 %
Neutro Abs: 6 K/uL (ref 1.4–6.5)
Neutrophils Relative %: 69 %

## 2016-04-25 LAB — CBC
HCT: 43.7 % (ref 40.0–52.0)
Hemoglobin: 14.7 g/dL (ref 13.0–18.0)
MCH: 28.1 pg (ref 26.0–34.0)
MCHC: 33.6 g/dL (ref 32.0–36.0)
MCV: 83.8 fL (ref 80.0–100.0)
PLATELETS: 155 10*3/uL (ref 150–440)
RBC: 5.22 MIL/uL (ref 4.40–5.90)
RDW: 14.3 % (ref 11.5–14.5)
WBC: 8.5 10*3/uL (ref 3.8–10.6)

## 2016-04-25 NOTE — Patient Instructions (Signed)
  Your procedure is scheduled on: Wednesday May 09, 2016. Report to Same Day Surgery. To find out your arrival time please call [phone] between 1PM - 3PM on Tuesday May 08, 2016.  Remember: Instructions that are not followed completely may result in serious medical risk, up to and including death, or upon the discretion of your surgeon and anesthesiologist your surgery may need to be rescheduled.    _x___ 1. Do not eat food or drink liquids after midnight. No gum chewing or hard candies.     _x__ 2. No Alcohol for 24 hours before or after surgery.   ____ 3. Bring all medications with you on the day of surgery if instructed.    __x__ 4. Notify your doctor if there is any change in your medical condition     (cold, fever, infections).     Do not wear jewelry, make-up, hairpins, clips or nail polish.  Do not wear lotions, powders, or perfumes. You may wear deodorant.  Do not shave 48 hours prior to surgery. Men may shave face and neck.  Do not bring valuables to the hospital.    [facility] is not responsible for any belongings or valuables.               Contacts, dentures or bridgework may not be worn into surgery.  Leave your suitcase in the car. After surgery it may be brought to your room.  For patients admitted to the hospital, discharge time is determined by your treatment team.   Patients discharged the day of surgery will not be allowed to drive home.    Please read over the following fact sheets that you were given:   [facility] Preparing for Surgery  _x___ Take these medicines the morning of surgery with A SIP OF WATER:    1. tamsulosin (FLOMAX)   ____ Fleet Enema (as directed)   ____ Use CHG Soap as directed on instruction sheet  ____ Use inhalers on the day of surgery and bring to hospital day of surgery  ____ Stop metformin 2 days prior to surgery    ____ Take 1/2 of usual insulin dose the night before surgery and none on the morning of  surgery.    _x___ Stop aspirin on May 02, 2016 per Dr. Anice Paganini instructions.  _x___ Stop Anti-inflammatories such as Advil, Aleve, Ibuprofen, Motrin, Naproxen, Naprosyn, Goodies powders or aspirin products. OK to take Tylenol.   _x___ Stop supplements: calcium and multivitamins until after surgery.    ____ Bring C-Pap to the hospital.

## 2016-04-26 LAB — URINE CULTURE
Culture: <10000 — AB
Special Requests: NORMAL

## 2016-05-09 ENCOUNTER — Ambulatory Visit: Payer: Medicare Other | Admitting: Anesthesiology

## 2016-05-09 ENCOUNTER — Ambulatory Visit
Admission: RE | Admit: 2016-05-09 | Discharge: 2016-05-09 | Disposition: A | Discharge status: Home / Self Care | Payer: Medicare Other | Source: Ambulatory Visit | Attending: Urology | Admitting: Urology

## 2016-05-09 ENCOUNTER — Encounter: Payer: Self-pay | Admitting: *Deleted

## 2016-05-09 ENCOUNTER — Telehealth: Payer: Self-pay

## 2016-05-09 ENCOUNTER — Encounter
Admission: RE | Disposition: A | Discharge status: Home / Self Care | Payer: Self-pay | Source: Ambulatory Visit | Attending: Urology

## 2016-05-09 DIAGNOSIS — Z7982 Long term (current) use of aspirin: Secondary | ICD-10-CM | POA: Insufficient documentation

## 2016-05-09 DIAGNOSIS — Z79899 Other long term (current) drug therapy: Secondary | ICD-10-CM | POA: Insufficient documentation

## 2016-05-09 DIAGNOSIS — Z79891 Long term (current) use of opiate analgesic: Secondary | ICD-10-CM | POA: Diagnosis not present

## 2016-05-09 DIAGNOSIS — I2510 Atherosclerotic heart disease of native coronary artery without angina pectoris: Secondary | ICD-10-CM | POA: Diagnosis not present

## 2016-05-09 DIAGNOSIS — E782 Mixed hyperlipidemia: Secondary | ICD-10-CM | POA: Insufficient documentation

## 2016-05-09 DIAGNOSIS — Z955 Presence of coronary angioplasty implant and graft: Secondary | ICD-10-CM | POA: Diagnosis not present

## 2016-05-09 DIAGNOSIS — Z9889 Other specified postprocedural states: Secondary | ICD-10-CM | POA: Diagnosis not present

## 2016-05-09 DIAGNOSIS — I10 Essential (primary) hypertension: Secondary | ICD-10-CM | POA: Diagnosis not present

## 2016-05-09 DIAGNOSIS — F329 Major depressive disorder, single episode, unspecified: Secondary | ICD-10-CM | POA: Insufficient documentation

## 2016-05-09 DIAGNOSIS — N202 Calculus of kidney with calculus of ureter: Secondary | ICD-10-CM | POA: Diagnosis not present

## 2016-05-09 DIAGNOSIS — Z91048 Other nonmedicinal substance allergy status: Secondary | ICD-10-CM | POA: Insufficient documentation

## 2016-05-09 DIAGNOSIS — N200 Calculus of kidney: Secondary | ICD-10-CM | POA: Diagnosis not present

## 2016-05-09 HISTORY — PX: URETEROSCOPY WITH HOLMIUM LASER LITHOTRIPSY: SHX6645

## 2016-05-09 HISTORY — PX: CYSTOSCOPY WITH STENT PLACEMENT: SHX5790

## 2016-05-09 SURGERY — URETEROSCOPY, WITH LITHOTRIPSY USING HOLMIUM LASER
Anesthesia: General | Laterality: Bilateral | Wound class: Clean Contaminated

## 2016-05-09 MED ORDER — CEPHALEXIN 500 MG PO CAPS: 500.0000 mg | ORAL_CAPSULE | Freq: Three times a day (TID) | ORAL | Status: DC

## 2016-05-09 MED ORDER — PROPOFOL 10 MG/ML IV BOLUS
INTRAVENOUS | Status: DC | PRN
  Administered 2016-05-09: 200 mg via INTRAVENOUS

## 2016-05-09 MED ORDER — KETOROLAC TROMETHAMINE 30 MG/ML IJ SOLN
INTRAMUSCULAR | Status: DC | PRN
  Administered 2016-05-09: 30 mg via INTRAVENOUS

## 2016-05-09 MED ORDER — LIDOCAINE HCL (CARDIAC) 20 MG/ML IV SOLN
INTRAVENOUS | Status: DC | PRN
  Administered 2016-05-09: 30 mg via INTRAVENOUS

## 2016-05-09 MED ORDER — EPHEDRINE SULFATE 50 MG/ML IJ SOLN
INTRAMUSCULAR | Status: DC | PRN
  Administered 2016-05-09 (×2): 10 mg via INTRAVENOUS
  Administered 2016-05-09: 20 mg via INTRAVENOUS

## 2016-05-09 MED ORDER — LACTATED RINGERS IV SOLN
INTRAVENOUS | Status: DC
  Administered 2016-05-09 (×3): via INTRAVENOUS

## 2016-05-09 MED ORDER — ONDANSETRON HCL 4 MG/2ML IJ SOLN: 4.0000 mg | Freq: Once | INTRAMUSCULAR | Status: DC | PRN

## 2016-05-09 MED ORDER — DEXAMETHASONE SODIUM PHOSPHATE 10 MG/ML IJ SOLN
INTRAMUSCULAR | Status: DC | PRN
  Administered 2016-05-09: 5 mg via INTRAVENOUS

## 2016-05-09 MED ORDER — ONDANSETRON HCL 4 MG/2ML IJ SOLN
INTRAMUSCULAR | Status: DC | PRN
  Administered 2016-05-09: 4 mg via INTRAVENOUS

## 2016-05-09 MED ORDER — CEFAZOLIN SODIUM-DEXTROSE 2-4 GM/100ML-% IV SOLN
2.0000 g | Freq: Once | INTRAVENOUS | Status: AC
  Administered 2016-05-09: 2 g via INTRAVENOUS

## 2016-05-09 MED ORDER — METOPROLOL TARTRATE 25 MG PO TABS
ORAL_TABLET | ORAL | Status: AC
  Filled 2016-05-09: qty 1

## 2016-05-09 MED ORDER — MIDAZOLAM HCL 2 MG/2ML IJ SOLN
INTRAMUSCULAR | Status: DC | PRN
  Administered 2016-05-09: 2 mg via INTRAVENOUS

## 2016-05-09 MED ORDER — FENTANYL CITRATE (PF) 100 MCG/2ML IJ SOLN
INTRAMUSCULAR | Status: DC | PRN
  Administered 2016-05-09: 100 ug via INTRAVENOUS

## 2016-05-09 MED ORDER — OXYCODONE-ACETAMINOPHEN 5-325 MG PO TABS: 1.0000 | ORAL_TABLET | ORAL | Status: DC | PRN

## 2016-05-09 MED ORDER — FENTANYL CITRATE (PF) 100 MCG/2ML IJ SOLN: 25.0000 ug | INTRAMUSCULAR | Status: DC | PRN

## 2016-05-09 MED ORDER — FAMOTIDINE 20 MG PO TABS
20.0000 mg | ORAL_TABLET | Freq: Once | ORAL | Status: AC
  Administered 2016-05-09: 20 mg via ORAL

## 2016-05-09 MED ORDER — METOPROLOL TARTRATE 25 MG PO TABS
25.0000 mg | ORAL_TABLET | Freq: Once | ORAL | Status: AC
  Administered 2016-05-09: 25 mg via ORAL

## 2016-05-09 SURGICAL SUPPLY — 32 items
BACTOSHIELD CHG 4% 4OZ (MISCELLANEOUS) ×2
BARD PROFLEX 200 LASER FIBER ×3 IMPLANT
BASKET ZERO TIP 1.9FR (BASKET) ×6 IMPLANT
CATH URETL 5X70 OPEN END (CATHETERS) ×3 IMPLANT
CNTNR SPEC 2.5X3XGRAD LEK (MISCELLANEOUS) ×1
CONT SPEC 4OZ STER OR WHT (MISCELLANEOUS) ×2
CONTAINER SPEC 2.5X3XGRAD LEK (MISCELLANEOUS) ×1 IMPLANT
FEE TECHNICIAN ONLY PER HOUR (MISCELLANEOUS) IMPLANT
FIBER LASER LITHO 200 (Laser) ×3 IMPLANT
GLOVE BIO SURGEON STRL SZ7 (GLOVE) ×6 IMPLANT
GLOVE BIO SURGEON STRL SZ7.5 (GLOVE) ×3 IMPLANT
GOWN STRL REUS W/ TWL LRG LVL4 (GOWN DISPOSABLE) ×1 IMPLANT
GOWN STRL REUS W/TWL LRG LVL4 (GOWN DISPOSABLE) ×2
GOWN STRL REUS W/TWL XL LVL3 (GOWN DISPOSABLE) ×3 IMPLANT
GUIDEWIRE SUPER STIFF (WIRE) IMPLANT
INTRODUCER DILATOR DOUBLE (INTRODUCER) ×3 IMPLANT
KIT RM TURNOVER CYSTO AR (KITS) ×3 IMPLANT
LASER FIBER 200M SMARTSCOPE (Laser) ×3 IMPLANT
PACK CYSTO AR (MISCELLANEOUS) ×3 IMPLANT
SCRUB CHG 4% DYNA-HEX 4OZ (MISCELLANEOUS) ×1 IMPLANT
SENSORWIRE 0.038 NOT ANGLED (WIRE) ×6
SET CYSTO W/LG BORE CLAMP LF (SET/KITS/TRAYS/PACK) ×3 IMPLANT
SHEATH URETERAL 12FR 45CM (SHEATH) ×3 IMPLANT
SHEATH URETERAL 13/15X36 1L (SHEATH) IMPLANT
SOL .9 NS 3000ML IRR  AL (IV SOLUTION) ×2
SOL .9 NS 3000ML IRR UROMATIC (IV SOLUTION) ×1 IMPLANT
STENT URET 6FRX24 CONTOUR (STENTS) IMPLANT
STENT URET 6FRX26 CONTOUR (STENTS) ×3 IMPLANT
SURGILUBE 2OZ TUBE FLIPTOP (MISCELLANEOUS) ×3 IMPLANT
SYRINGE IRR TOOMEY STRL 70CC (SYRINGE) ×3 IMPLANT
WATER STERILE IRR 1000ML POUR (IV SOLUTION) ×3 IMPLANT
WIRE SENSOR 0.038 NOT ANGLED (WIRE) ×2 IMPLANT

## 2016-05-09 NOTE — Telephone Encounter (Signed)
-----   Message from Nickie Retort, MD sent at 05/09/2016 10:46 AM EDT ----- Patient needs appt for one week to remove bilateral ureteral stents. Since i am not here next week, ok to schedule with another doctor if patient is agreeable.

## 2016-05-09 NOTE — Anesthesia Preprocedure Evaluation (Signed)
Anesthesia Evaluation  Patient identified by MRN, date of birth, ID band Patient awake    Reviewed: Allergy & Precautions, NPO status , Patient's Chart, lab work & pertinent test results  Airway Mallampati: III       Dental  (+) Teeth Intact   Pulmonary neg pulmonary ROS,    Pulmonary exam normal        Cardiovascular Exercise Tolerance: Good hypertension, Pt. on home beta blockers + CAD   Rhythm:Regular     Neuro/Psych  Headaches, Depression    GI/Hepatic negative GI ROS,   Endo/Other  negative endocrine ROS  Renal/GU      Musculoskeletal   Abdominal Normal abdominal exam  (+)   Peds  Hematology negative hematology ROS (+)   Anesthesia Other Findings   Reproductive/Obstetrics                             Anesthesia Physical Anesthesia Plan  ASA: III  Anesthesia Plan: General   Post-op Pain Management:    Induction: Intravenous  Airway Management Planned: LMA and Oral ETT  Additional Equipment:   Intra-op Plan:   Post-operative Plan: Extubation in OR  Informed Consent: I have reviewed the patients History and Physical, chart, labs and discussed the procedure including the risks, benefits and alternatives for the proposed anesthesia with the patient or authorized representative who has indicated his/her understanding and acceptance.     Plan Discussed with: CRNA  Anesthesia Plan Comments:         Anesthesia Quick Evaluation

## 2016-05-09 NOTE — Interval H&P Note (Signed)
History and Physical Interval Note:  05/09/2016 8:55 AM  Luis Strong  has presented today for surgery, with the diagnosis of bilateral nephrolithiasis  The various methods of treatment have been discussed with the patient and family. After consideration of risks, benefits and other options for treatment, the patient has consented to  Procedure(s): URETEROSCOPY WITH HOLMIUM LASER LITHOTRIPSY (Bilateral) CYSTOSCOPY WITH STENT PLACEMENT (Bilateral) as a surgical intervention .  The patient's history has been reviewed, patient examined, no change in status, stable for surgery.  I have reviewed the patient's chart and labs.  Questions were answered to the patient's satisfaction.    RRR Unlabored resp   Nickie Retort

## 2016-05-09 NOTE — H&P (View-Only) (Signed)
04/12/2016 2:51 PM   Luis Strong May 28, 1946 [id number]  Referring provider: Juline Patch, MD [address] [place], [place] [address]  Chief Complaint  Patient presents with  . Follow-up    Nephrolithoasis    HPI: The patient is a 70 year old gentleman recent presented with a chief complaint of patella to be a left ureteral stone like his previous stones. This was confirmed to be a proximal left 5 mm ureteral stone on CT scan. Also had bilateral nonobstructing nephrolithiasis with the largest being 8 mm on the left.   On today's KUB, does not appear that the left ureteral stone is visible on my read. It was visible on the original scout film from the aforementioned CT scan.  Last 3 days, the patient has not had any stone pain. He feels he may have passed a it at the gym. He has been straining his urine but not  100% of the time. He is interested in definitive stone management of his bilateral nonobstructing stones.  PMH: Past Medical History  Diagnosis Date  . Hyperlipidemia   . Hypertension   . Depression   . Arteriosclerosis of coronary artery 11/16/2014    Overview:  Sp pci stetn of lad 2015   . Benign essential HTN 11/16/2014  . MI (mitral incompetence) 04/20/2014  . Abnormal cardiovascular stress test 11/15/2015  . Chest pain 11/03/2014  . Combined fat and carbohydrate induced hyperlipemia 11/15/2015    Surgical History: Past Surgical History  Procedure Laterality Date  . Cardiac catheterization  10/2014  . Colonoscopy  2010    normal    Home Medications:    Medication List       This list is accurate as of: 04/12/16  2:51 PM.  Always use your most recent med list.               aspirin 81 MG chewable tablet  Chew 1 tablet by mouth daily.     citalopram 10 MG tablet  Commonly known as:  CELEXA  TAKE 1 TABLET DAILY     metoprolol succinate 25 MG 24 hr tablet  Commonly known as:  TOPROL-XL     oxyCODONE-acetaminophen 5-325 MG  tablet  Commonly known as:  ROXICET  Take 1-2 tablets by mouth every 4 (four) hours as needed for severe pain.     simvastatin 40 MG tablet  Commonly known as:  ZOCOR  TAKE 1 TABLET DAILY     tamsulosin 0.4 MG Caps capsule  Commonly known as:  FLOMAX  Take 1 capsule (0.4 mg total) by mouth daily.        Allergies:  Allergies  Allergen Reactions  . Capsaicin     Family History: No family history on file.  Social History:  reports that he has never smoked. He does not have any smokeless tobacco history on file. He reports that he drinks alcohol. He reports that he does not use illicit drugs.  ROS: UROLOGY Burning/pain with urination?: No Urine stream starts and stops?: No Trouble starting stream?: No Do you have to strain to urinate?: No Blood in urine?: No Urinary tract infection?: No Sexually transmitted disease?: No Injury to kidneys or bladder?: No Painful intercourse?: No Weak stream?: No Erection problems?: No Penile pain?: No                                      Physical  Exam: There were no vitals taken for this visit.  Constitutional:  Alert and oriented, No acute distress. HEENT: [place] AT, moist mucus membranes.  Trachea midline, no masses. Cardiovascular: No clubbing, cyanosis, or edema. Respiratory: Normal respiratory effort, no increased work of breathing. GI: Abdomen is soft, nontender, nondistended, no abdominal masses GU: No CVA tenderness.  Skin: No rashes, bruises or suspicious lesions. Lymph: No cervical or inguinal adenopathy. Neurologic: Grossly intact, no focal deficits, moving all 4 extremities. Psychiatric: Normal mood and affect.  Laboratory Data: Lab Results  Component Value Date   WBC 8.0 04/18/2012   HGB 15.5 04/18/2012   HCT 45.3 04/18/2012   MCV 88 04/18/2012   PLT 156 04/18/2012    Lab Results  Component Value Date   CREATININE 1.61* 11/11/2014    No results found for: PSA  No results found for:  TESTOSTERONE  No results found for: HGBA1C  Urinalysis    Component Value Date/Time   COLORURINE YELLOW 04/18/2012 1323   APPEARANCEUR Clear 03/09/2016 0929   APPEARANCEUR HAZY 04/18/2012 1323   LABSPEC 1.020 04/18/2012 1323   PHURINE 6.5 04/18/2012 1323   GLUCOSEU Negative 03/09/2016 0929   GLUCOSEU NEGATIVE 04/18/2012 1323   HGBUR TRACE 04/18/2012 1323   BILIRUBINUR Negative 03/09/2016 0929   BILIRUBINUR NEGATIVE 04/18/2012 1323   KETONESUR NEGATIVE 04/18/2012 1323   PROTEINUR Negative 03/09/2016 0929   PROTEINUR 30 mg/dL 04/18/2012 1323   NITRITE Negative 03/09/2016 0929   NITRITE NEGATIVE 04/18/2012 1323   LEUKOCYTESUR Negative 03/09/2016 0929   LEUKOCYTESUR NEGATIVE 04/18/2012 1323     Assessment & Plan:   1. Left ureteral stone 5 mm -Appears to have passed  2. Bilateral nephrolithiasis I discussed with the patient is wife the multiple treatment options for his bilateral nonobstructing stones. We discussed both lithotripsy and ureteroscopy. I feel that he is a better candidate for ureteroscopy as this gives him the highest likelihood of being stone free after only one procedure. We'll plan for bilateral ureteroscopy with laser lithotripsy. I will like to start the procedure was shooting a left retrograde pyelogram to confirm that his left ureteral stone is gone as it appears to be on KUB and on clinical exam. The next step will be to start on the right sinuses highest stone burden as well as large stones in the right kidney. He understands the risks, benefits, indications procedure. He understands this may take more than one procedure. He understands he will have bilateral ureteral stents following the procedure. He understands the risks also include bleeding and infection as well as intraoperative misadventure. All questions were answered and the patient has agreed to proceed.   Nickie Retort, MD  [facility] [address] [place],  [address] [phone]

## 2016-05-09 NOTE — Discharge Instructions (Signed)
AMBULATORY SURGERY  DISCHARGE INSTRUCTIONS   1) The drugs that you were given will stay in your system until tomorrow so for the next 24 hours you should not:  A) Drive an automobile B) Make any legal decisions C) Drink any alcoholic beverage   2) You may resume regular meals tomorrow.  Today it is better to start with liquids and gradually work up to solid foods.  You may eat anything you prefer, but it is better to start with liquids, then soup and crackers, and gradually work up to solid foods.   3) Please notify your doctor immediately if you have any unusual bleeding, trouble breathing, redness and pain at the surgery site, drainage, fever, or pain not relieved by medication.    4) Additional Instructions: TAKE A STOOL SOFTENER TWICE A DAY WHILE TAKING NARCOTIC PAIN MEDICINE TO PREVENT CONSTIPATION   Please contact your physician with any problems or Same Day Surgery at [phone], Monday through Friday 6 am to 4 pm, or [place] at [facility] number at [phone].   Ureteroscopy, Care After Refer to this sheet in the next few weeks. These instructions provide you with information on caring for yourself after your procedure. Your health care provider may also give you more specific instructions. Your treatment has been planned according to current medical practices, but problems sometimes occur. Call your health care provider if you have any problems or questions after your procedure.  WHAT TO EXPECT AFTER THE PROCEDURE  After your procedure, it is typical to have the following:   A burning sensation when you urinate.  Blood in your urine. HOME CARE INSTRUCTIONS   Only take medicines as directed by your health care provider. Do not take any over-the-counter pain medication unless your health care provider says it is okay.  Take a warm bath or hold a warm washcloth over your groin to relieve burning.  Drink enough fluids to keep your urine clear or pale  yellow.  Drink two 8-ounce glasses of water every hour for the first 2 hours after you get home.  Continue to drink water often at home.  You can eat what you usually do.  Ask your surgeon when you can do your usual activities.  If you had a tube placed to keep urine flowing (ureteral stent), ask your health care provider when you need to return to have it removed.  Keep all follow-up appointments. SEEK MEDICAL CARE IF:   You have chills or fever.  You have burning pain for longer than 24 hours after the procedure.  You have blood in your urine for longer than 24 hours after the procedure. SEEK IMMEDIATE MEDICAL CARE IF:   You have large amounts of blood or clots in your urine.  You have very bad pain.  You have chest pain or trouble breathing.   This information is not intended to replace advice given to you by your health care provider. Make sure you discuss any questions you have with your health care provider.   Document Released: 11/10/2013 Document Reviewed: 11/10/2013 Elsevier Interactive Patient Education [employer].

## 2016-05-09 NOTE — Op Note (Signed)
Date of procedure: 05/09/2016  Preoperative diagnosis:  1. Bilateral nephrolithiasis   Postoperative diagnosis:  1. Bilateral nephrolithiasis   Procedure: 1. Cystoscopy 2. Bilateral ureteroscopy 3. Laser lithotripsy 4. Stone basketing 5. Bilateral retrograde polygrams interpretation 6. Bilateral ureteral stent placement 6 French by 26 cm without strings  Surgeon: Baruch Gouty, MD  Anesthesia: General  Complications: None  Intraoperative findings: The patient bilateral nephrolithiasis that were broken small piece laser lithotripsy and the larger fragments removed. Bilateral retrograde polygrams procedure showed no filling defects.  EBL: None  Specimens: None  Drains: 6 French by 26 cm double-J ureteral stent bilaterally  Disposition: Stable to the postanesthesia care unit  Indication for procedure: The patient is a 70 y.o. male with bilateral nonobstructing stones presents today for definitive management.  After reviewing the management options for treatment, the patient elected to proceed with the above surgical procedure(s). We have discussed the potential benefits and risks of the procedure, side effects of the proposed treatment, the likelihood of the patient achieving the goals of the procedure, and any potential problems that might occur during the procedure or recuperation. Informed consent has been obtained.  Description of procedure: The patient was met in the preoperative area. All risks, benefits, and indications of the procedure were described in great detail. The patient consented to the procedure. Preoperative antibiotics were given. The patient was taken to the operative theater. General anesthesia was induced per the anesthesia service. The patient was then placed in the dorsal lithotomy position and prepped and draped in the usual sterile fashion. A preoperative timeout was called.    A 12 French 30 cystoscope symptoms . [employer]. The right ureteral  orifice intubated with a dual catheter 2 sensor wires placed level the renal pelvis. Under fluoroscopy the ureteral access sheath was then placed atraumatically. The posterior ureteroscope was then advanced the patient's right renal pelvis. Pan ureteroscopy revealed multiple stones that were broken into smaller pieces and the larger fragments were removed via stone basket. This was done with the laser. Pan nephroscopy again the procedure showed no further stone burden. The ureteral access sheath and then removed under direct visualization showing no fremitus in the ureter and no trauma. The cystoscope was reassembled over the sensor wire. 6 [place] by 26 ureteral J ureteral stent was in place the sensor wire removed. The stent was confirmed in the correct place with fluoroscopy proximally and with direct position the patient's urinary bladder. This process was then repeated on the left side in identical fashion. All stone fragments were broken small pieces. Larger fragment  were removed with a stone basket. The renal axis sheath was removed in identical fashion as on the right side. Stent was placed in identical fashion as oh right side  Plan: The patient will follow-up in one to 2 weeks for stent removal. He will need a renal ultrasound in one month to rule out iatrogenic hydronephrosis.  Baruch Gouty, M.D.

## 2016-05-09 NOTE — Transfer of Care (Signed)
Immediate Anesthesia Transfer of Care Note  Patient: Luis Strong  Procedure(s) Performed: Procedure(s): URETEROSCOPY WITH HOLMIUM LASER LITHOTRIPSY (Bilateral) CYSTOSCOPY WITH STENT PLACEMENT (Bilateral)  Patient Location: PACU  Anesthesia Type:General  Level of Consciousness: sedated  Airway & Oxygen Therapy: Patient Spontanous Breathing and Patient connected to face mask oxygen  Post-op Assessment: Report given to RN and Post -op Vital signs reviewed and stable  Post vital signs: Reviewed and stable  Last Vitals:  Filed Vitals:   05/09/16 0811  BP: 121/66  Pulse: 78  Temp: 36.6 C  Resp: 18    Last Pain: There were no vitals filed for this visit.       Complications: No apparent anesthesia complications

## 2016-05-09 NOTE — Anesthesia Postprocedure Evaluation (Signed)
Anesthesia Post Note  Patient: ADHVIK NANNA  Procedure(s) Performed: Procedure(s) (LRB): URETEROSCOPY WITH HOLMIUM LASER LITHOTRIPSY (Bilateral) CYSTOSCOPY WITH STENT PLACEMENT (Bilateral)  Patient location during evaluation: PACU Anesthesia Type: General Level of consciousness: awake Pain management: satisfactory to patient Vital Signs Assessment: post-procedure vital signs reviewed and stable Respiratory status: respiratory function stable Cardiovascular status: stable Anesthetic complications: no    Last Vitals:  Filed Vitals:   05/09/16 0811 05/09/16 1045  BP: 121/66 123/68  Pulse: 78 85  Temp: 36.6 C 36 C  Resp: 18 17    Last Pain:  Filed Vitals:   05/09/16 1049  PainSc: Asleep                 VAN STAVEREN,Olayinka Gathers

## 2016-05-18 LAB — STONE ANALYSIS
CA OXALATE, DIHYDRATE: 5 %
Ca Oxalate,Monohydr.: 90 %
Ca phos cry stone ql IR: 5 %
Stone Weight KSTONE: 83.2 mg

## 2016-05-23 ENCOUNTER — Ambulatory Visit (INDEPENDENT_AMBULATORY_CARE_PROVIDER_SITE_OTHER): Payer: Medicare Other | Admitting: Urology

## 2016-05-23 DIAGNOSIS — N200 Calculus of kidney: Secondary | ICD-10-CM

## 2016-05-23 LAB — URINALYSIS, COMPLETE
BILIRUBIN UA: NEGATIVE
GLUCOSE, UA: NEGATIVE
KETONES UA: NEGATIVE
Nitrite, UA: NEGATIVE
SPEC GRAV UA: 1.02 (ref 1.005–1.030)
Urobilinogen, Ur: 0.2 mg/dL (ref 0.2–1.0)
pH, UA: 5.5 (ref 5.0–7.5)

## 2016-05-23 LAB — MICROSCOPIC EXAMINATION
Bacteria, UA: NONE SEEN
RBC, UA: >30 /hpf — AB (ref 0–?)

## 2016-05-23 MED ORDER — LIDOCAINE HCL 2 % EX GEL
1.0000 "application " | Freq: Once | CUTANEOUS | Status: AC
  Administered 2016-05-23: 1 via URETHRAL

## 2016-05-23 MED ORDER — CIPROFLOXACIN HCL 500 MG PO TABS
500.0000 mg | ORAL_TABLET | Freq: Once | ORAL | Status: AC
  Administered 2016-05-23: 500 mg via ORAL

## 2016-05-23 NOTE — Progress Notes (Signed)
Patient returns following bilateral ureteroscopy with laser lithotripsy and stone basket removal 05/09/2016. Bilateral ureteral stents were placed at the time he returns today for cystoscopy and stent removal. He's had some bladder discomfort and flank discomfort from the stents but nothing significant. He had no fever or dysuria.  Procedure-cystoscopy with ureteral stent removal left and right-after consent was obtained he was placed supine. He was prepped and draped in the usual sterile fashion. The cystoscope was passed per urethra and the left ureteral stent was grasped and removed through the urethral meatus without difficulty. The cystoscope was passed again per urethra and the right ureteral stent noted, it was grasped and removed without difficulty. Patient tolerated the procedure well. He was covered with Cipro.   Assessment/plan: Bilateral nephrolithiasis status post definitive ureteroscopy June 2017 with bilateral stent removal today. He'll return in about 6 weeks with a renal ultrasound prior to ensure no hydronephrosis.

## 2016-05-30 DIAGNOSIS — Z1283 Encounter for screening for malignant neoplasm of skin: Secondary | ICD-10-CM | POA: Diagnosis not present

## 2016-05-30 DIAGNOSIS — Z85828 Personal history of other malignant neoplasm of skin: Secondary | ICD-10-CM | POA: Diagnosis not present

## 2016-05-30 DIAGNOSIS — L570 Actinic keratosis: Secondary | ICD-10-CM | POA: Diagnosis not present

## 2016-06-17 ENCOUNTER — Other Ambulatory Visit: Payer: Self-pay | Admitting: Family Medicine

## 2016-07-02 ENCOUNTER — Ambulatory Visit
Admission: RE | Admit: 2016-07-02 | Discharge: 2016-07-02 | Disposition: A | Discharge status: Home / Self Care | Payer: Medicare Other | Source: Ambulatory Visit | Attending: Urology | Admitting: Urology

## 2016-07-02 DIAGNOSIS — Q619 Cystic kidney disease, unspecified: Secondary | ICD-10-CM | POA: Diagnosis not present

## 2016-07-02 DIAGNOSIS — N200 Calculus of kidney: Secondary | ICD-10-CM | POA: Diagnosis not present

## 2016-07-05 ENCOUNTER — Telehealth: Payer: Self-pay

## 2016-07-05 ENCOUNTER — Ambulatory Visit: Payer: Medicare Other

## 2016-07-05 NOTE — Telephone Encounter (Signed)
Notify patient his renal u/s was normal. - Dr. Junious Silk    [facility]

## 2016-07-06 NOTE — Telephone Encounter (Signed)
Patient notified of normal renal ultrasound and follow up appointment cancelled.

## 2016-07-08 DIAGNOSIS — H25813 Combined forms of age-related cataract, bilateral: Secondary | ICD-10-CM | POA: Diagnosis not present

## 2016-07-08 DIAGNOSIS — H524 Presbyopia: Secondary | ICD-10-CM | POA: Diagnosis not present

## 2016-07-09 ENCOUNTER — Ambulatory Visit: Payer: Medicare Other

## 2016-09-17 ENCOUNTER — Other Ambulatory Visit: Payer: Self-pay | Admitting: Family Medicine

## 2016-09-27 ENCOUNTER — Encounter: Payer: Self-pay | Admitting: Family Medicine

## 2016-09-27 ENCOUNTER — Ambulatory Visit (INDEPENDENT_AMBULATORY_CARE_PROVIDER_SITE_OTHER): Payer: Medicare Other | Admitting: Family Medicine

## 2016-09-27 VITALS — BP 120/62 | HR 64 | Ht 69.0 in | Wt 203.0 lb

## 2016-09-27 DIAGNOSIS — R69 Illness, unspecified: Secondary | ICD-10-CM

## 2016-09-27 DIAGNOSIS — Z1211 Encounter for screening for malignant neoplasm of colon: Secondary | ICD-10-CM | POA: Diagnosis not present

## 2016-09-27 DIAGNOSIS — R351 Nocturia: Secondary | ICD-10-CM

## 2016-09-27 DIAGNOSIS — E785 Hyperlipidemia, unspecified: Secondary | ICD-10-CM

## 2016-09-27 DIAGNOSIS — I10 Essential (primary) hypertension: Secondary | ICD-10-CM

## 2016-09-27 DIAGNOSIS — F329 Major depressive disorder, single episode, unspecified: Secondary | ICD-10-CM | POA: Diagnosis not present

## 2016-09-27 DIAGNOSIS — Z0000 Encounter for general adult medical examination without abnormal findings: Secondary | ICD-10-CM

## 2016-09-27 LAB — HEMOCCULT GUIAC POC 1CARD (OFFICE): Fecal Occult Blood, POC: NEGATIVE

## 2016-09-27 MED ORDER — CITALOPRAM HYDROBROMIDE 10 MG PO TABS: ORAL_TABLET | ORAL | 1 refills | Status: DC

## 2016-09-27 MED ORDER — METOPROLOL SUCCINATE ER 25 MG PO TB24: 25.0000 mg | ORAL_TABLET | Freq: Every day | ORAL | 1 refills | Status: DC

## 2016-09-27 MED ORDER — SIMVASTATIN 40 MG PO TABS: 40.0000 mg | ORAL_TABLET | Freq: Every day | ORAL | 1 refills | Status: DC
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
## 2016-09-28 LAB — HEPATIC FUNCTION PANEL
ALK PHOS: 64 IU/L (ref 39–117)
ALT: 29 IU/L (ref 0–44)
AST: 32 IU/L (ref 0–40)
BILIRUBIN TOTAL: 0.9 mg/dL (ref 0.0–1.2)
Bilirubin, Direct: 0.19 mg/dL (ref 0.00–0.40)
TOTAL PROTEIN: 7.2 g/dL (ref 6.0–8.5)

## 2016-09-28 LAB — LIPID PANEL
Chol/HDL Ratio: 4.6 ratio units (ref 0.0–5.0)
Cholesterol, Total: 176 mg/dL (ref 100–199)
HDL: 38 mg/dL — ABNORMAL LOW (ref >39)
LDL Calculated: 86 mg/dL (ref 0–99)
Triglycerides: 258 mg/dL — ABNORMAL HIGH (ref 0–149)
VLDL Cholesterol Cal: 52 mg/dL — ABNORMAL HIGH (ref 5–40)

## 2016-09-28 LAB — RENAL FUNCTION PANEL
Albumin: 4.6 g/dL (ref 3.5–4.8)
BUN / CREAT RATIO: 7 — AB (ref 10–24)
BUN: 12 mg/dL (ref 8–27)
CHLORIDE: 99 mmol/L (ref 96–106)
CO2: 23 mmol/L (ref 18–29)
Calcium: 9.5 mg/dL (ref 8.6–10.2)
Creatinine, Ser: 1.71 mg/dL — ABNORMAL HIGH (ref 0.76–1.27)
GFR calc non Af Amer: 39 mL/min/{1.73_m2} — ABNORMAL LOW (ref >59)
GFR, EST AFRICAN AMERICAN: 46 mL/min/{1.73_m2} — AB (ref >59)
GLUCOSE: 79 mg/dL (ref 65–99)
POTASSIUM: 4.8 mmol/L (ref 3.5–5.2)
Phosphorus: 3.7 mg/dL (ref 2.5–4.5)
Sodium: 138 mmol/L (ref 134–144)

## 2016-09-28 LAB — PSA: PROSTATE SPECIFIC AG, SERUM: 2.4 ng/mL (ref 0.0–4.0)

## 2016-10-08 DIAGNOSIS — N1832 Chronic kidney disease, stage 3b: Secondary | ICD-10-CM | POA: Insufficient documentation

## 2016-10-08 DIAGNOSIS — N183 Chronic kidney disease, stage 3 unspecified: Secondary | ICD-10-CM | POA: Insufficient documentation

## 2016-10-08 DIAGNOSIS — E782 Mixed hyperlipidemia: Secondary | ICD-10-CM | POA: Diagnosis not present

## 2016-10-08 DIAGNOSIS — I10 Essential (primary) hypertension: Secondary | ICD-10-CM | POA: Diagnosis not present

## 2016-10-08 DIAGNOSIS — I2510 Atherosclerotic heart disease of native coronary artery without angina pectoris: Secondary | ICD-10-CM | POA: Diagnosis not present

## 2017-03-27 ENCOUNTER — Other Ambulatory Visit: Payer: Self-pay | Admitting: Family Medicine

## 2017-03-27 DIAGNOSIS — E785 Hyperlipidemia, unspecified: Secondary | ICD-10-CM

## 2017-05-01 ENCOUNTER — Other Ambulatory Visit: Payer: Self-pay | Admitting: Family Medicine

## 2017-05-01 DIAGNOSIS — I10 Essential (primary) hypertension: Secondary | ICD-10-CM

## 2017-05-16 ENCOUNTER — Other Ambulatory Visit: Payer: Self-pay | Admitting: Family Medicine

## 2017-05-16 DIAGNOSIS — F329 Major depressive disorder, single episode, unspecified: Secondary | ICD-10-CM

## 2017-06-07 ENCOUNTER — Other Ambulatory Visit: Payer: Self-pay

## 2017-06-11 DIAGNOSIS — Z872 Personal history of diseases of the skin and subcutaneous tissue: Secondary | ICD-10-CM | POA: Diagnosis not present

## 2017-06-11 DIAGNOSIS — Z85828 Personal history of other malignant neoplasm of skin: Secondary | ICD-10-CM | POA: Diagnosis not present

## 2017-06-11 DIAGNOSIS — L853 Xerosis cutis: Secondary | ICD-10-CM | POA: Diagnosis not present

## 2017-06-27 ENCOUNTER — Other Ambulatory Visit: Payer: Self-pay | Admitting: Family Medicine

## 2017-06-27 DIAGNOSIS — E785 Hyperlipidemia, unspecified: Secondary | ICD-10-CM

## 2017-07-02 ENCOUNTER — Encounter: Payer: Self-pay | Admitting: Family Medicine

## 2017-07-02 ENCOUNTER — Ambulatory Visit (INDEPENDENT_AMBULATORY_CARE_PROVIDER_SITE_OTHER): Payer: Medicare Other | Admitting: Family Medicine

## 2017-07-02 VITALS — BP 120/80 | HR 68 | Ht 69.0 in | Wt 202.0 lb

## 2017-07-02 DIAGNOSIS — Z23 Encounter for immunization: Secondary | ICD-10-CM | POA: Diagnosis not present

## 2017-07-02 DIAGNOSIS — R351 Nocturia: Secondary | ICD-10-CM | POA: Diagnosis not present

## 2017-07-02 DIAGNOSIS — F329 Major depressive disorder, single episode, unspecified: Secondary | ICD-10-CM

## 2017-07-02 DIAGNOSIS — E785 Hyperlipidemia, unspecified: Secondary | ICD-10-CM | POA: Diagnosis not present

## 2017-07-02 DIAGNOSIS — I10 Essential (primary) hypertension: Secondary | ICD-10-CM

## 2017-07-02 DIAGNOSIS — Z1211 Encounter for screening for malignant neoplasm of colon: Secondary | ICD-10-CM | POA: Diagnosis not present

## 2017-07-02 LAB — HEMOCCULT GUIAC POC 1CARD (OFFICE): FECAL OCCULT BLD: NEGATIVE

## 2017-07-02 MED ORDER — METOPROLOL SUCCINATE ER 25 MG PO TB24: 25.0000 mg | ORAL_TABLET | Freq: Every day | ORAL | 1 refills | Status: DC

## 2017-07-02 MED ORDER — SIMVASTATIN 40 MG PO TABS: ORAL_TABLET | ORAL | 1 refills | Status: DC

## 2017-07-02 MED ORDER — CITALOPRAM HYDROBROMIDE 10 MG PO TABS: ORAL_TABLET | ORAL | 1 refills | Status: DC
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
## 2017-07-03 LAB — RENAL FUNCTION PANEL
ALBUMIN: 4.7 g/dL (ref 3.5–4.8)
BUN/Creatinine Ratio: 9 — ABNORMAL LOW (ref 10–24)
BUN: 13 mg/dL (ref 8–27)
CO2: 20 mmol/L (ref 20–29)
CREATININE: 1.43 mg/dL — AB (ref 0.76–1.27)
Calcium: 9.6 mg/dL (ref 8.6–10.2)
Chloride: 105 mmol/L (ref 96–106)
GFR calc Af Amer: 57 mL/min/{1.73_m2} — ABNORMAL LOW (ref >59)
GFR, EST NON AFRICAN AMERICAN: 49 mL/min/{1.73_m2} — AB (ref >59)
GLUCOSE: 78 mg/dL (ref 65–99)
PHOSPHORUS: 3.1 mg/dL (ref 2.5–4.5)
POTASSIUM: 4.6 mmol/L (ref 3.5–5.2)
Sodium: 140 mmol/L (ref 134–144)

## 2017-07-03 LAB — LIPID PANEL
Chol/HDL Ratio: 4.6 ratio (ref 0.0–5.0)
Cholesterol, Total: 162 mg/dL (ref 100–199)
HDL: 35 mg/dL — ABNORMAL LOW (ref >39)
LDL Calculated: 74 mg/dL (ref 0–99)
TRIGLYCERIDES: 264 mg/dL — AB (ref 0–149)
VLDL Cholesterol Cal: 53 mg/dL — ABNORMAL HIGH (ref 5–40)

## 2017-07-11 DIAGNOSIS — I10 Essential (primary) hypertension: Secondary | ICD-10-CM | POA: Diagnosis not present

## 2017-07-11 DIAGNOSIS — E782 Mixed hyperlipidemia: Secondary | ICD-10-CM | POA: Diagnosis not present

## 2017-07-11 DIAGNOSIS — I340 Nonrheumatic mitral (valve) insufficiency: Secondary | ICD-10-CM | POA: Diagnosis not present

## 2017-07-11 DIAGNOSIS — I2510 Atherosclerotic heart disease of native coronary artery without angina pectoris: Secondary | ICD-10-CM | POA: Diagnosis not present

## 2017-10-01 ENCOUNTER — Ambulatory Visit (INDEPENDENT_AMBULATORY_CARE_PROVIDER_SITE_OTHER): Payer: Medicare Other | Admitting: Family Medicine

## 2017-10-01 ENCOUNTER — Encounter: Payer: Self-pay | Admitting: Family Medicine

## 2017-10-01 VITALS — BP 130/80 | HR 68 | Ht 69.0 in | Wt 205.0 lb

## 2017-10-01 DIAGNOSIS — I10 Essential (primary) hypertension: Secondary | ICD-10-CM | POA: Diagnosis not present

## 2017-10-01 DIAGNOSIS — Z1211 Encounter for screening for malignant neoplasm of colon: Secondary | ICD-10-CM | POA: Diagnosis not present

## 2017-10-01 DIAGNOSIS — E663 Overweight: Secondary | ICD-10-CM | POA: Diagnosis not present

## 2017-10-01 DIAGNOSIS — Z0000 Encounter for general adult medical examination without abnormal findings: Secondary | ICD-10-CM

## 2017-10-01 DIAGNOSIS — R0681 Apnea, not elsewhere classified: Secondary | ICD-10-CM

## 2017-10-01 DIAGNOSIS — E782 Mixed hyperlipidemia: Secondary | ICD-10-CM

## 2017-10-01 LAB — HEMOCCULT GUIAC POC 1CARD (OFFICE): FECAL OCCULT BLD: NEGATIVE
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
## 2017-10-02 ENCOUNTER — Other Ambulatory Visit: Payer: Medicare Other

## 2017-10-02 DIAGNOSIS — I10 Essential (primary) hypertension: Secondary | ICD-10-CM

## 2017-10-02 DIAGNOSIS — R69 Illness, unspecified: Secondary | ICD-10-CM | POA: Diagnosis not present

## 2017-10-02 DIAGNOSIS — R351 Nocturia: Secondary | ICD-10-CM | POA: Diagnosis not present

## 2017-10-02 DIAGNOSIS — E785 Hyperlipidemia, unspecified: Secondary | ICD-10-CM | POA: Diagnosis not present

## 2017-10-03 ENCOUNTER — Other Ambulatory Visit: Payer: Self-pay

## 2017-10-03 LAB — LIPID PANEL WITH LDL/HDL RATIO
CHOLESTEROL TOTAL: 177 mg/dL (ref 100–199)
HDL: 37 mg/dL — AB (ref >39)
LDL CALC: 94 mg/dL (ref 0–99)
LDL/HDL RATIO: 2.5 ratio (ref 0.0–3.6)
TRIGLYCERIDES: 229 mg/dL — AB (ref 0–149)
VLDL CHOLESTEROL CAL: 46 mg/dL — AB (ref 5–40)

## 2017-10-03 LAB — HEPATIC FUNCTION PANEL
ALK PHOS: 65 IU/L (ref 39–117)
ALT: 22 IU/L (ref 0–44)
AST: 30 IU/L (ref 0–40)
Bilirubin Total: 0.6 mg/dL (ref 0.0–1.2)
Bilirubin, Direct: 0.17 mg/dL (ref 0.00–0.40)
TOTAL PROTEIN: 7 g/dL (ref 6.0–8.5)

## 2017-10-03 LAB — RENAL FUNCTION PANEL
Albumin: 4.7 g/dL (ref 3.5–4.8)
BUN / CREAT RATIO: 11 (ref 10–24)
BUN: 15 mg/dL (ref 8–27)
CALCIUM: 9.7 mg/dL (ref 8.6–10.2)
CO2: 22 mmol/L (ref 20–29)
CREATININE: 1.42 mg/dL — AB (ref 0.76–1.27)
Chloride: 102 mmol/L (ref 96–106)
GFR, EST AFRICAN AMERICAN: 57 mL/min/{1.73_m2} — AB (ref >59)
GFR, EST NON AFRICAN AMERICAN: 49 mL/min/{1.73_m2} — AB (ref >59)
Glucose: 89 mg/dL (ref 65–99)
Phosphorus: 3.9 mg/dL (ref 2.5–4.5)
Potassium: 4.4 mmol/L (ref 3.5–5.2)
SODIUM: 141 mmol/L (ref 134–144)

## 2017-10-03 LAB — PSA: Prostate Specific Ag, Serum: 2.6 ng/mL (ref 0.0–4.0)

## 2017-10-25 DIAGNOSIS — F341 Dysthymic disorder: Secondary | ICD-10-CM | POA: Diagnosis not present

## 2017-10-25 DIAGNOSIS — E669 Obesity, unspecified: Secondary | ICD-10-CM | POA: Diagnosis not present

## 2017-10-25 DIAGNOSIS — G478 Other sleep disorders: Secondary | ICD-10-CM | POA: Diagnosis not present

## 2017-10-25 DIAGNOSIS — E785 Hyperlipidemia, unspecified: Secondary | ICD-10-CM | POA: Diagnosis not present

## 2017-10-25 DIAGNOSIS — I259 Chronic ischemic heart disease, unspecified: Secondary | ICD-10-CM | POA: Diagnosis not present

## 2017-10-25 DIAGNOSIS — I10 Essential (primary) hypertension: Secondary | ICD-10-CM | POA: Diagnosis not present

## 2017-11-22 ENCOUNTER — Other Ambulatory Visit: Payer: Self-pay

## 2017-11-22 DIAGNOSIS — Z1211 Encounter for screening for malignant neoplasm of colon: Secondary | ICD-10-CM

## 2017-11-22 DIAGNOSIS — Z1212 Encounter for screening for malignant neoplasm of rectum: Principal | ICD-10-CM

## 2017-11-22 MED ORDER — NA SULFATE-K SULFATE-MG SULF 17.5-3.13-1.6 GM/177ML PO SOLN: 1.0000 | Freq: Once | ORAL | 0 refills | Status: AC

## 2017-11-22 NOTE — Addendum Note (Signed)
Addended by: Peggye Ley on: 11/22/2017 03:10 PM   Modules accepted: Orders

## 2017-11-22 NOTE — Progress Notes (Signed)
Gastroenterology Pre-Procedure Review  Request Date: 1/17 Requesting Physician: Dr. Allen Norris  PATIENT REVIEW QUESTIONS: The patient responded to the following health history questions as indicated:    1. Are you having any GI issues? no 2. Do you have a personal history of Polyps? no 3. Do you have a family history of Colon Cancer or Polyps? no 4. Diabetes Mellitus? no 5. Joint replacements in the past 12 months?no 6. Major health problems in the past 3 months?no 7. Any artificial heart valves, MVP, or defibrillator?no    MEDICATIONS & ALLERGIES:    Patient reports the following regarding taking any anticoagulation/antiplatelet therapy:   Plavix, Coumadin, Eliquis, Xarelto, Lovenox, Pradaxa, Brilinta, or Effient? no Aspirin? yes (81mg )  Patient confirms/reports the following medications:  Current Outpatient Medications  Medication Sig Dispense Refill  . aspirin 81 MG chewable tablet Chew 1 tablet by mouth daily. In am    . Chelated Magnesium 100 MG TABS Take by mouth.    . citalopram (CELEXA) 10 MG tablet TAKE 1 TABLET DAILY (NOT SEEN IN A YEAR, SCHEDULE APPOINTMENT) 90 tablet 1  . metoprolol succinate (TOPROL XL) 25 MG 24 hr tablet Take 1 tablet (25 mg total) by mouth at bedtime. 90 tablet 1  . Multiple Vitamins-Minerals (MULTIVITAMIN WITH MINERALS) tablet Take by mouth.    . simvastatin (ZOCOR) 40 MG tablet TAKE 1 TABLET DAILY 90 tablet 1   No current facility-administered medications for this visit.     Patient confirms/reports the following allergies:  Allergies  Allergen Reactions  . Capsaicin Itching and Rash    Severe rash and itching.    No orders of the defined types were placed in this encounter.   AUTHORIZATION INFORMATION Primary Insurance: 1D#: Group #:  Secondary Insurance: 1D#: Group #:  SCHEDULE INFORMATION: Date: 12/05/17 Time: Location: [place]

## 2017-11-25 ENCOUNTER — Telehealth: Payer: Self-pay | Admitting: Gastroenterology

## 2017-11-25 NOTE — Telephone Encounter (Signed)
Gastroenterology Pre-Procedure Review  Request Date:   Requesting Physician: Dr.    PATIENT REVIEW QUESTIONS: The patient responded to the following health history questions as indicated:    1. Are you having any GI issues? no 2. Do you have a personal history of Polyps? no 3. Do you have a family history of Colon Cancer or Polyps? no 4. Diabetes Mellitus? no 5. Joint replacements in the past 12 months?no 6. Major health problems in the past 3 months?no 7. Any artificial heart valves, MVP, or defibrillator?no    MEDICATIONS & ALLERGIES:    Patient reports the following regarding taking any anticoagulation/antiplatelet therapy:   Plavix, Coumadin, Eliquis, Xarelto, Lovenox, Pradaxa, Brilinta, or Effient? no Aspirin? yes (ASA 81 mg)  Patient confirms/reports the following medications:  Current Outpatient Medications  Medication Sig Dispense Refill   aspirin 81 MG chewable tablet Chew 1 tablet by mouth daily. In am     Chelated Magnesium 100 MG TABS Take by mouth.     citalopram (CELEXA) 10 MG tablet TAKE 1 TABLET DAILY (NOT SEEN IN A YEAR, SCHEDULE APPOINTMENT) 90 tablet 1   metoprolol succinate (TOPROL XL) 25 MG 24 hr tablet Take 1 tablet (25 mg total) by mouth at bedtime. 90 tablet 1   Multiple Vitamins-Minerals (MULTIVITAMIN WITH MINERALS) tablet Take by mouth.     simvastatin (ZOCOR) 40 MG tablet TAKE 1 TABLET DAILY 90 tablet 1   No current facility-administered medications for this visit.     Patient confirms/reports the following allergies:  Allergies  Allergen Reactions   Capsaicin Itching and Rash    Severe rash and itching.    No orders of the defined types were placed in this encounter.   AUTHORIZATION INFORMATION Primary Insurance: 1D#: Group #:  Secondary Insurance: 1D#: Group #:  SCHEDULE INFORMATION: Date:  Time: Location:

## 2017-11-28 ENCOUNTER — Other Ambulatory Visit: Payer: Self-pay

## 2017-11-28 ENCOUNTER — Encounter: Payer: Self-pay | Admitting: *Deleted

## 2017-11-28 DIAGNOSIS — G4733 Obstructive sleep apnea (adult) (pediatric): Secondary | ICD-10-CM | POA: Diagnosis not present

## 2017-11-29 ENCOUNTER — Telehealth: Payer: Self-pay | Admitting: Gastroenterology

## 2017-11-29 ENCOUNTER — Other Ambulatory Visit: Payer: Self-pay

## 2017-11-29 NOTE — Telephone Encounter (Signed)
Gastroenterology Pre-Procedure Review  Request Date:   Requesting Physician: Dr.    PATIENT REVIEW QUESTIONS: The patient responded to the following health history questions as indicated:    1. Are you having any GI issues? no 2. Do you have a personal history of Polyps? no 3. Do you have a family history of Colon Cancer or Polyps? no 4. Diabetes Mellitus? no 5. Joint replacements in the past 12 months?no 6. Major health problems in the past 3 months?no 7. Any artificial heart valves, MVP, or defibrillator?no    MEDICATIONS & ALLERGIES:    Patient reports the following regarding taking any anticoagulation/antiplatelet therapy:   Plavix, Coumadin, Eliquis, Xarelto, Lovenox, Pradaxa, Brilinta, or Effient? no Aspirin? yes (ASA 81 mg)  Patient confirms/reports the following medications:  Current Outpatient Medications  Medication Sig Dispense Refill   aspirin 81 MG chewable tablet Chew 1 tablet by mouth daily. In am     citalopram (CELEXA) 10 MG tablet TAKE 1 TABLET DAILY (NOT SEEN IN A YEAR, SCHEDULE APPOINTMENT) 90 tablet 1   Coenzyme Q10 (COQ10) 100 MG CAPS Take by mouth daily.     Flaxseed, Linseed, (FLAXSEED OIL) 1200 MG CAPS Take by mouth daily.     metoprolol succinate (TOPROL XL) 25 MG 24 hr tablet Take 1 tablet (25 mg total) by mouth at bedtime. 90 tablet 1   Omega-3 Fatty Acids (OMEGA-3 FISH OIL PO) Take 1,040 mg by mouth daily.     PLANT STANOL ESTER PO Take 900 mg by mouth daily.     Resveratrol 250 MG CAPS Take by mouth daily.     simvastatin (ZOCOR) 40 MG tablet TAKE 1 TABLET DAILY 90 tablet 1   Turmeric 500 MG TABS Take by mouth daily.     No current facility-administered medications for this visit.     Patient confirms/reports the following allergies:  Allergies  Allergen Reactions   Capsaicin Itching and Rash    Severe rash and itching.    No orders of the defined types were placed in this encounter.   AUTHORIZATION INFORMATION Primary  Insurance: 1D#: Group #:  Secondary Insurance: 1D#: Group #:  SCHEDULE INFORMATION: Date:  Time: Location:

## 2017-12-02 NOTE — Discharge Instructions (Signed)
General Anesthesia, Adult, Care After °These instructions provide you with information about caring for yourself after your procedure. Your health care provider may also give you more specific instructions. Your treatment has been planned according to current medical practices, but problems sometimes occur. Call your health care provider if you have any problems or questions after your procedure. °What can I expect after the procedure? °After the procedure, it is common to have: °· Vomiting. °· A sore throat. °· Mental slowness. ° °It is common to feel: °· Nauseous. °· Cold or shivery. °· Sleepy. °· Tired. °· Sore or achy, even in parts of your body where you did not have surgery. ° °Follow these instructions at home: °For at least 24 hours after the procedure: °· Do not: °? Participate in activities where you could fall or become injured. °? Drive. °? Use heavy machinery. °? Drink alcohol. °? Take sleeping pills or medicines that cause drowsiness. °? Make important decisions or sign legal documents. °? Take care of children on your own. °· Rest. °Eating and drinking °· If you vomit, drink water, juice, or soup when you can drink without vomiting. °· Drink enough fluid to keep your urine clear or pale yellow. °· Make sure you have little or no nausea before eating solid foods. °· Follow the diet recommended by your health care provider. °General instructions °· Have a responsible adult stay with you until you are awake and alert. °· Return to your normal activities as told by your health care provider. Ask your health care provider what activities are safe for you. °· Take over-the-counter and prescription medicines only as told by your health care provider. °· If you smoke, do not smoke without supervision. °· Keep all follow-up visits as told by your health care provider. This is important. °Contact a health care provider if: °· You continue to have nausea or vomiting at home, and medicines are not helpful. °· You  cannot drink fluids or start eating again. °· You cannot urinate after 8-12 hours. °· You develop a skin rash. °· You have fever. °· You have increasing redness at the site of your procedure. °Get help right away if: °· You have difficulty breathing. °· You have chest pain. °· You have unexpected bleeding. °· You feel that you are having a life-threatening or urgent problem. °This information is not intended to replace advice given to you by your health care provider. Make sure you discuss any questions you have with your health care provider. °Document Released: 02/11/2001 Document Revised: 04/09/2016 Document Reviewed: 10/20/2015 °Elsevier Interactive Patient Education © 2018 [employer] ° °

## 2017-12-05 ENCOUNTER — Ambulatory Visit: Payer: Medicare Other | Admitting: Anesthesiology

## 2017-12-05 ENCOUNTER — Ambulatory Visit
Admission: RE | Admit: 2017-12-05 | Discharge: 2017-12-05 | Disposition: A | Discharge status: Home / Self Care | Payer: Medicare Other | Source: Ambulatory Visit | Attending: Gastroenterology | Admitting: Gastroenterology

## 2017-12-05 ENCOUNTER — Encounter
Admission: RE | Disposition: A | Discharge status: Home / Self Care | Payer: Self-pay | Source: Ambulatory Visit | Attending: Gastroenterology

## 2017-12-05 DIAGNOSIS — Z955 Presence of coronary angioplasty implant and graft: Secondary | ICD-10-CM | POA: Diagnosis not present

## 2017-12-05 DIAGNOSIS — Z79899 Other long term (current) drug therapy: Secondary | ICD-10-CM | POA: Insufficient documentation

## 2017-12-05 DIAGNOSIS — D120 Benign neoplasm of cecum: Secondary | ICD-10-CM | POA: Diagnosis not present

## 2017-12-05 DIAGNOSIS — E7801 Familial hypercholesterolemia: Secondary | ICD-10-CM | POA: Diagnosis not present

## 2017-12-05 DIAGNOSIS — K219 Gastro-esophageal reflux disease without esophagitis: Secondary | ICD-10-CM | POA: Insufficient documentation

## 2017-12-05 DIAGNOSIS — Z1211 Encounter for screening for malignant neoplasm of colon: Secondary | ICD-10-CM | POA: Diagnosis not present

## 2017-12-05 DIAGNOSIS — F329 Major depressive disorder, single episode, unspecified: Secondary | ICD-10-CM | POA: Insufficient documentation

## 2017-12-05 DIAGNOSIS — Z7982 Long term (current) use of aspirin: Secondary | ICD-10-CM | POA: Diagnosis not present

## 2017-12-05 DIAGNOSIS — I340 Nonrheumatic mitral (valve) insufficiency: Secondary | ICD-10-CM | POA: Insufficient documentation

## 2017-12-05 DIAGNOSIS — D124 Benign neoplasm of descending colon: Secondary | ICD-10-CM | POA: Diagnosis not present

## 2017-12-05 DIAGNOSIS — I2510 Atherosclerotic heart disease of native coronary artery without angina pectoris: Secondary | ICD-10-CM | POA: Insufficient documentation

## 2017-12-05 DIAGNOSIS — Z85828 Personal history of other malignant neoplasm of skin: Secondary | ICD-10-CM | POA: Insufficient documentation

## 2017-12-05 DIAGNOSIS — I10 Essential (primary) hypertension: Secondary | ICD-10-CM | POA: Insufficient documentation

## 2017-12-05 DIAGNOSIS — Z888 Allergy status to other drugs, medicaments and biological substances status: Secondary | ICD-10-CM | POA: Insufficient documentation

## 2017-12-05 DIAGNOSIS — Z1212 Encounter for screening for malignant neoplasm of rectum: Secondary | ICD-10-CM | POA: Diagnosis not present

## 2017-12-05 DIAGNOSIS — K635 Polyp of colon: Secondary | ICD-10-CM | POA: Diagnosis not present

## 2017-12-05 DIAGNOSIS — K640 First degree hemorrhoids: Secondary | ICD-10-CM | POA: Insufficient documentation

## 2017-12-05 DIAGNOSIS — G4730 Sleep apnea, unspecified: Secondary | ICD-10-CM | POA: Insufficient documentation

## 2017-12-05 HISTORY — PX: COLONOSCOPY WITH PROPOFOL: SHX5780

## 2017-12-05 HISTORY — PX: POLYPECTOMY: SHX149

## 2017-12-05 HISTORY — DX: Gastro-esophageal reflux disease without esophagitis: K21.9

## 2017-12-05 SURGERY — COLONOSCOPY WITH PROPOFOL
Anesthesia: General | Wound class: Contaminated

## 2017-12-05 MED ORDER — LIDOCAINE HCL (CARDIAC) 20 MG/ML IV SOLN
INTRAVENOUS | Status: DC | PRN
  Administered 2017-12-05: 25 mg via INTRAVENOUS

## 2017-12-05 MED ORDER — SODIUM CHLORIDE 0.9 % IV SOLN: INTRAVENOUS | Status: DC

## 2017-12-05 MED ORDER — LACTATED RINGERS IV SOLN
INTRAVENOUS | Status: DC
  Administered 2017-12-05 (×2): via INTRAVENOUS

## 2017-12-05 MED ORDER — PROPOFOL 10 MG/ML IV BOLUS
INTRAVENOUS | Status: DC | PRN
  Administered 2017-12-05: 30 mg via INTRAVENOUS
  Administered 2017-12-05: 20 mg via INTRAVENOUS
  Administered 2017-12-05: 30 mg via INTRAVENOUS
  Administered 2017-12-05: 50 mg via INTRAVENOUS
  Administered 2017-12-05: 20 mg via INTRAVENOUS
  Administered 2017-12-05 (×2): 30 mg via INTRAVENOUS
  Administered 2017-12-05: 50 mg via INTRAVENOUS

## 2017-12-05 SURGICAL SUPPLY — 24 items
CANISTER SUCT 1200ML W/VALVE (MISCELLANEOUS) ×3 IMPLANT
CLIP HMST 235XBRD CATH ROT (MISCELLANEOUS) IMPLANT
CLIP RESOLUTION 360 11X235 (MISCELLANEOUS)
ELECT REM PT RETURN 9FT ADLT (ELECTROSURGICAL)
ELECTRODE REM PT RTRN 9FT ADLT (ELECTROSURGICAL) IMPLANT
FCP ESCP3.2XJMB 240X2.8X (MISCELLANEOUS)
FORCEPS BIOP RAD 4 LRG CAP 4 (CUTTING FORCEPS) ×3 IMPLANT
FORCEPS BIOP RJ4 240 W/NDL (MISCELLANEOUS)
FORCEPS ESCP3.2XJMB 240X2.8X (MISCELLANEOUS) IMPLANT
GOWN CVR UNV OPN BCK APRN NK (MISCELLANEOUS) ×4 IMPLANT
GOWN ISOL THUMB LOOP REG UNIV (MISCELLANEOUS) ×2
INJECTOR VARIJECT VIN23 (MISCELLANEOUS) IMPLANT
KIT DEFENDO VALVE AND CONN (KITS) IMPLANT
KIT ENDO PROCEDURE OLY (KITS) ×3 IMPLANT
MARKER SPOT ENDO TATTOO 5ML (MISCELLANEOUS) IMPLANT
PROBE APC STR FIRE (PROBE) IMPLANT
RETRIEVER NET ROTH 2.5X230 LF (MISCELLANEOUS) IMPLANT
SNARE SHORT THROW 13M SML OVAL (MISCELLANEOUS) ×3 IMPLANT
SNARE SHORT THROW 30M LRG OVAL (MISCELLANEOUS) IMPLANT
SNARE SNG USE RND 15MM (INSTRUMENTS) IMPLANT
SPOT EX ENDOSCOPIC TATTOO (MISCELLANEOUS)
TRAP ETRAP POLY (MISCELLANEOUS) ×3 IMPLANT
VARIJECT INJECTOR VIN23 (MISCELLANEOUS)
WATER STERILE IRR 250ML POUR (IV SOLUTION) ×3 IMPLANT

## 2017-12-05 NOTE — Op Note (Signed)
[facility] Patient Name: Luis Strong Procedure Date: 12/05/2017 8:09 AM MRN: [id number] Account #: [id number] Date of Birth: 1946/03/25 Admit Type: Outpatient Age: 72 Room: [facility] OR ROOM 01 Gender: Male Note Status: Finalized Procedure:            Colonoscopy Indications:          Screening for colorectal malignant neoplasm Providers:            [name] MD, MD Referring MD:         Juline Patch, MD (Referring MD) Medicines:            Propofol per Anesthesia Complications:        No immediate complications. Procedure:            Pre-Anesthesia Assessment:                       - Prior to the procedure, a History and Physical was                        performed, and patient medications and allergies were                        reviewed. The patient's tolerance of previous                        anesthesia was also reviewed. The risks and benefits of                        the procedure and the sedation options and risks were                        discussed with the patient. All questions were                        answered, and informed consent was obtained. Prior                        Anticoagulants: The patient has taken no previous                        anticoagulant or antiplatelet agents. ASA Grade                        Assessment: II - A patient with mild systemic disease.                        After reviewing the risks and benefits, the patient was                        deemed in satisfactory condition to undergo the                        procedure.                       After obtaining informed consent, the colonoscope was                        passed under direct vision. Throughout the procedure,  the patient's blood pressure, pulse, and oxygen                        saturations were monitored continuously. The Olympus                        Colonoscope [id number]) was introduced through the                         anus and advanced to the the cecum, identified by                        appendiceal orifice and ileocecal valve. The                        colonoscopy was performed without difficulty. The                        patient tolerated the procedure well. The quality of                        the bowel preparation was excellent. Findings:      The perianal and digital rectal examinations were normal.      A 2 mm polyp was found in the cecum. The polyp was sessile. The polyp       was removed with a cold biopsy forceps. Resection and retrieval were       complete.      A 5 mm polyp was found in the descending colon. The polyp was sessile.       The polyp was removed with a cold snare. Resection and retrieval were       complete.      Non-bleeding internal hemorrhoids were found during retroflexion. The       hemorrhoids were Grade I (internal hemorrhoids that do not prolapse).      A few small-mouthed diverticula were found in the sigmoid colon. Impression:           - One 2 mm polyp in the cecum, removed with a cold                        biopsy forceps. Resected and retrieved.                       - One 5 mm polyp in the descending colon, removed with                        a cold snare. Resected and retrieved.                       - Non-bleeding internal hemorrhoids.                       - Diverticulosis in the sigmoid colon. Recommendation:       - Discharge patient to home.                       - Resume previous diet.                       - Continue present medications.                       -  Await pathology results.                       - Repeat colonoscopy in 5 years if polyp adenoma and 10                        years if hyperplastic Procedure Code(s):    --- Professional ---                       [phone], Colonoscopy, flexible; with removal of tumor(s),                        polyp(s), or other lesion(s) by snare technique                       [id number],  47, Colonoscopy, flexible; with biopsy, single                        or multiple Diagnosis Code(s):    --- Professional ---                       Z12.11, Encounter for screening for malignant neoplasm                        of colon                       D12.0, Benign neoplasm of cecum                       D12.4, Benign neoplasm of descending colon CPT copyright 2016 [employer]. All rights reserved. The codes documented in this report are preliminary and upon coder review may  be revised to meet current compliance requirements. [name] MD, MD 12/05/2017 8:28:19 AM This report has been signed electronically. Number of Addenda: 0 Note Initiated On: 12/05/2017 8:09 AM Scope Withdrawal Time: 0 hours 8 minutes 54 seconds  Total Procedure Duration: 0 hours 13 minutes 3 seconds       [facility]

## 2017-12-05 NOTE — Transfer of Care (Signed)
Immediate Anesthesia Transfer of Care Note  Patient: Luis Strong  Procedure(s) Performed: COLONOSCOPY WITH PROPOFOL (N/A ) POLYPECTOMY INTESTINAL  Patient Location: PACU  Anesthesia Type: General  Level of Consciousness: awake, alert  and patient cooperative  Airway and Oxygen Therapy: Patient Spontanous Breathing and Patient connected to supplemental oxygen  Post-op Assessment: Post-op Vital signs reviewed, Patient's Cardiovascular Status Stable, Respiratory Function Stable, Patent Airway and No signs of Nausea or vomiting  Post-op Vital Signs: Reviewed and stable  Complications: No apparent anesthesia complications
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
## 2017-12-05 NOTE — Anesthesia Procedure Notes (Signed)
Procedure Name: MAC Performed by: Lurline Caver, CRNA Pre-anesthesia Checklist: Patient identified, Emergency Drugs available, Suction available, Patient being monitored and Timeout performed Patient Re-evaluated:Patient Re-evaluated prior to induction Oxygen Delivery Method: Nasal cannula Preoxygenation: Pre-oxygenation with 100% oxygen       

## 2017-12-05 NOTE — Anesthesia Postprocedure Evaluation (Signed)
Anesthesia Post Note  Patient: Luis Strong  Procedure(s) Performed: COLONOSCOPY WITH PROPOFOL (N/A ) POLYPECTOMY INTESTINAL  Patient location during evaluation: PACU Anesthesia Type: General Level of consciousness: awake Pain management: pain level controlled Vital Signs Assessment: post-procedure vital signs reviewed and stable Respiratory status: spontaneous breathing Cardiovascular status: blood pressure returned to baseline Postop Assessment: no headache Anesthetic complications: no    Lavonna Monarch

## 2017-12-05 NOTE — Anesthesia Preprocedure Evaluation (Addendum)
Anesthesia Evaluation  Patient identified by MRN, date of birth, ID band Patient awake    Reviewed: Allergy & Precautions, NPO status , Patient's Chart, lab work & pertinent test results, reviewed documented beta blocker date and time   History of Anesthesia Complications (+) history of anesthetic complications (Headache after stent 2015)  Airway Mallampati: II  TM Distance: >3 FB Neck ROM: Full    Dental no notable dental hx.    Pulmonary sleep apnea ,    Pulmonary exam normal breath sounds clear to auscultation       Cardiovascular hypertension, + CAD (s/p LAD stent 2015)  Normal cardiovascular exam Rhythm:Regular Rate:Normal     Neuro/Psych PSYCHIATRIC DISORDERS Depression negative neurological ROS     GI/Hepatic Neg liver ROS, GERD  ,  Endo/Other  negative endocrine ROS  Renal/GU Renal disease     Musculoskeletal negative musculoskeletal ROS (+)   Abdominal Normal abdominal exam  (+)  Abdomen: soft.    Peds  Hematology negative hematology ROS (+)   Anesthesia Other Findings   Reproductive/Obstetrics                            Anesthesia Physical Anesthesia Plan  ASA: III  Anesthesia Plan: General   Post-op Pain Management:    Induction:   PONV Risk Score and Plan:   Airway Management Planned: Natural Airway  Additional Equipment: None  Intra-op Plan:   Post-operative Plan:   Informed Consent: I have reviewed the patients History and Physical, chart, labs and discussed the procedure including the risks, benefits and alternatives for the proposed anesthesia with the patient or authorized representative who has indicated his/her understanding and acceptance.     Plan Discussed with: CRNA, Surgeon and Anesthesiologist  Anesthesia Plan Comments:         Anesthesia Quick Evaluation

## 2017-12-06 ENCOUNTER — Encounter: Payer: Self-pay | Admitting: Gastroenterology

## 2017-12-07 ENCOUNTER — Encounter: Payer: Self-pay | Admitting: Gastroenterology

## 2017-12-16 DIAGNOSIS — E663 Overweight: Secondary | ICD-10-CM | POA: Diagnosis not present

## 2017-12-16 DIAGNOSIS — I10 Essential (primary) hypertension: Secondary | ICD-10-CM | POA: Diagnosis not present

## 2017-12-16 DIAGNOSIS — I259 Chronic ischemic heart disease, unspecified: Secondary | ICD-10-CM | POA: Diagnosis not present

## 2017-12-16 DIAGNOSIS — G4733 Obstructive sleep apnea (adult) (pediatric): Secondary | ICD-10-CM | POA: Diagnosis not present

## 2018-01-06 ENCOUNTER — Other Ambulatory Visit: Payer: Self-pay | Admitting: Family Medicine

## 2018-01-06 DIAGNOSIS — I10 Essential (primary) hypertension: Secondary | ICD-10-CM

## 2018-01-20 DIAGNOSIS — E663 Overweight: Secondary | ICD-10-CM | POA: Diagnosis not present

## 2018-01-20 DIAGNOSIS — G4733 Obstructive sleep apnea (adult) (pediatric): Secondary | ICD-10-CM | POA: Diagnosis not present

## 2018-01-20 DIAGNOSIS — I10 Essential (primary) hypertension: Secondary | ICD-10-CM | POA: Diagnosis not present

## 2018-01-20 DIAGNOSIS — I259 Chronic ischemic heart disease, unspecified: Secondary | ICD-10-CM | POA: Diagnosis not present

## 2018-01-21 ENCOUNTER — Other Ambulatory Visit: Payer: Self-pay | Admitting: Family Medicine

## 2018-01-21 DIAGNOSIS — F329 Major depressive disorder, single episode, unspecified: Secondary | ICD-10-CM

## 2018-03-04 ENCOUNTER — Other Ambulatory Visit: Payer: Self-pay | Admitting: Family Medicine

## 2018-03-04 DIAGNOSIS — E785 Hyperlipidemia, unspecified: Secondary | ICD-10-CM

## 2018-04-08 ENCOUNTER — Other Ambulatory Visit: Payer: Self-pay | Admitting: Family Medicine

## 2018-04-08 DIAGNOSIS — I10 Essential (primary) hypertension: Secondary | ICD-10-CM

## 2018-04-17 DIAGNOSIS — Z872 Personal history of diseases of the skin and subcutaneous tissue: Secondary | ICD-10-CM | POA: Diagnosis not present

## 2018-04-17 DIAGNOSIS — L570 Actinic keratosis: Secondary | ICD-10-CM | POA: Diagnosis not present

## 2018-04-17 DIAGNOSIS — Z85828 Personal history of other malignant neoplasm of skin: Secondary | ICD-10-CM | POA: Diagnosis not present

## 2018-04-17 DIAGNOSIS — L84 Corns and callosities: Secondary | ICD-10-CM | POA: Diagnosis not present

## 2018-04-17 DIAGNOSIS — L578 Other skin changes due to chronic exposure to nonionizing radiation: Secondary | ICD-10-CM | POA: Diagnosis not present

## 2018-05-01 ENCOUNTER — Other Ambulatory Visit: Payer: Self-pay | Admitting: Family Medicine

## 2018-05-01 DIAGNOSIS — F329 Major depressive disorder, single episode, unspecified: Secondary | ICD-10-CM

## 2018-05-15 ENCOUNTER — Other Ambulatory Visit: Payer: Self-pay | Admitting: Family Medicine

## 2018-05-15 DIAGNOSIS — E785 Hyperlipidemia, unspecified: Secondary | ICD-10-CM

## 2018-06-02 DIAGNOSIS — I259 Chronic ischemic heart disease, unspecified: Secondary | ICD-10-CM | POA: Diagnosis not present

## 2018-06-02 DIAGNOSIS — I10 Essential (primary) hypertension: Secondary | ICD-10-CM | POA: Diagnosis not present

## 2018-06-02 DIAGNOSIS — E669 Obesity, unspecified: Secondary | ICD-10-CM | POA: Diagnosis not present

## 2018-06-02 DIAGNOSIS — F17210 Nicotine dependence, cigarettes, uncomplicated: Secondary | ICD-10-CM | POA: Diagnosis not present

## 2018-06-02 DIAGNOSIS — G4733 Obstructive sleep apnea (adult) (pediatric): Secondary | ICD-10-CM | POA: Diagnosis not present

## 2018-06-23 ENCOUNTER — Other Ambulatory Visit: Payer: Self-pay

## 2018-06-23 ENCOUNTER — Emergency Department: Payer: Medicare Other

## 2018-06-23 ENCOUNTER — Inpatient Hospital Stay
Admission: EM | Admit: 2018-06-23 | Discharge: 2018-06-26 | DRG: 871 | Disposition: A | Discharge status: Home / Self Care | Payer: Medicare Other | Attending: Internal Medicine | Admitting: Internal Medicine

## 2018-06-23 DIAGNOSIS — Z7982 Long term (current) use of aspirin: Secondary | ICD-10-CM

## 2018-06-23 DIAGNOSIS — G9341 Metabolic encephalopathy: Secondary | ICD-10-CM | POA: Diagnosis present

## 2018-06-23 DIAGNOSIS — R443 Hallucinations, unspecified: Secondary | ICD-10-CM | POA: Diagnosis present

## 2018-06-23 DIAGNOSIS — K219 Gastro-esophageal reflux disease without esophagitis: Secondary | ICD-10-CM | POA: Diagnosis present

## 2018-06-23 DIAGNOSIS — J9601 Acute respiratory failure with hypoxia: Secondary | ICD-10-CM | POA: Diagnosis present

## 2018-06-23 DIAGNOSIS — A419 Sepsis, unspecified organism: Secondary | ICD-10-CM | POA: Diagnosis not present

## 2018-06-23 DIAGNOSIS — N179 Acute kidney failure, unspecified: Secondary | ICD-10-CM | POA: Diagnosis present

## 2018-06-23 DIAGNOSIS — E785 Hyperlipidemia, unspecified: Secondary | ICD-10-CM | POA: Diagnosis present

## 2018-06-23 DIAGNOSIS — A86 Unspecified viral encephalitis: Secondary | ICD-10-CM | POA: Diagnosis present

## 2018-06-23 DIAGNOSIS — R410 Disorientation, unspecified: Secondary | ICD-10-CM

## 2018-06-23 DIAGNOSIS — Z955 Presence of coronary angioplasty implant and graft: Secondary | ICD-10-CM

## 2018-06-23 DIAGNOSIS — R4182 Altered mental status, unspecified: Secondary | ICD-10-CM | POA: Diagnosis not present

## 2018-06-23 DIAGNOSIS — I2510 Atherosclerotic heart disease of native coronary artery without angina pectoris: Secondary | ICD-10-CM | POA: Diagnosis present

## 2018-06-23 DIAGNOSIS — R51 Headache: Secondary | ICD-10-CM | POA: Diagnosis present

## 2018-06-23 DIAGNOSIS — G4700 Insomnia, unspecified: Secondary | ICD-10-CM | POA: Diagnosis present

## 2018-06-23 DIAGNOSIS — N183 Chronic kidney disease, stage 3 (moderate): Secondary | ICD-10-CM | POA: Diagnosis present

## 2018-06-23 DIAGNOSIS — G0490 Encephalitis and encephalomyelitis, unspecified: Secondary | ICD-10-CM | POA: Diagnosis not present

## 2018-06-23 DIAGNOSIS — I129 Hypertensive chronic kidney disease with stage 1 through stage 4 chronic kidney disease, or unspecified chronic kidney disease: Secondary | ICD-10-CM | POA: Diagnosis present

## 2018-06-23 DIAGNOSIS — J9602 Acute respiratory failure with hypercapnia: Secondary | ICD-10-CM | POA: Diagnosis present

## 2018-06-23 DIAGNOSIS — J189 Pneumonia, unspecified organism: Secondary | ICD-10-CM | POA: Diagnosis present

## 2018-06-23 DIAGNOSIS — Z8249 Family history of ischemic heart disease and other diseases of the circulatory system: Secondary | ICD-10-CM | POA: Diagnosis not present

## 2018-06-23 DIAGNOSIS — F4323 Adjustment disorder with mixed anxiety and depressed mood: Secondary | ICD-10-CM | POA: Diagnosis present

## 2018-06-23 DIAGNOSIS — G9340 Encephalopathy, unspecified: Secondary | ICD-10-CM | POA: Diagnosis not present

## 2018-06-23 DIAGNOSIS — Z4682 Encounter for fitting and adjustment of non-vascular catheter: Secondary | ICD-10-CM | POA: Diagnosis not present

## 2018-06-23 DIAGNOSIS — I10 Essential (primary) hypertension: Secondary | ICD-10-CM | POA: Diagnosis not present

## 2018-06-23 DIAGNOSIS — R6520 Severe sepsis without septic shock: Secondary | ICD-10-CM | POA: Diagnosis present

## 2018-06-23 DIAGNOSIS — E872 Acidosis: Secondary | ICD-10-CM | POA: Diagnosis not present

## 2018-06-23 DIAGNOSIS — R7989 Other specified abnormal findings of blood chemistry: Secondary | ICD-10-CM

## 2018-06-23 LAB — TROPONIN I: Troponin I: <0.03 ng/mL (ref <0.03)

## 2018-06-23 LAB — URINALYSIS, COMPLETE (UACMP) WITH MICROSCOPIC
BACTERIA UA: NONE SEEN
Bilirubin Urine: NEGATIVE
GLUCOSE, UA: NEGATIVE mg/dL
Hgb urine dipstick: NEGATIVE
Ketones, ur: 20 mg/dL — AB
Leukocytes, UA: NEGATIVE
Nitrite: NEGATIVE
Protein, ur: 100 mg/dL — AB
Specific Gravity, Urine: 1.019 (ref 1.005–1.030)
pH: 8 (ref 5.0–8.0)

## 2018-06-23 LAB — COMPREHENSIVE METABOLIC PANEL
ALK PHOS: 54 U/L (ref 38–126)
ALT: 32 U/L (ref 0–44)
AST: 52 U/L — AB (ref 15–41)
Albumin: 4.4 g/dL (ref 3.5–5.0)
Anion gap: 13 (ref 5–15)
BUN: 16 mg/dL (ref 8–23)
CALCIUM: 9.3 mg/dL (ref 8.9–10.3)
CHLORIDE: 109 mmol/L (ref 98–111)
CO2: 18 mmol/L — AB (ref 22–32)
CREATININE: 1.7 mg/dL — AB (ref 0.61–1.24)
GFR calc Af Amer: 45 mL/min — ABNORMAL LOW (ref >60)
GFR, EST NON AFRICAN AMERICAN: 38 mL/min — AB (ref >60)
Glucose, Bld: 156 mg/dL — ABNORMAL HIGH (ref 70–99)
Potassium: 3.9 mmol/L (ref 3.5–5.1)
SODIUM: 140 mmol/L (ref 135–145)
Total Bilirubin: 1.5 mg/dL — ABNORMAL HIGH (ref 0.3–1.2)
Total Protein: 7.6 g/dL (ref 6.5–8.1)

## 2018-06-23 LAB — CBC WITH DIFFERENTIAL/PLATELET
Basophils Absolute: 0 10*3/uL (ref 0–0.1)
Basophils Relative: 0 %
EOS ABS: 0 10*3/uL (ref 0–0.7)
Eosinophils Relative: 0 %
HCT: 44.7 % (ref 40.0–52.0)
Hemoglobin: 14.7 g/dL (ref 13.0–18.0)
LYMPHS ABS: 1.2 10*3/uL (ref 1.0–3.6)
Lymphocytes Relative: 9 %
MCH: 25 pg — ABNORMAL LOW (ref 26.0–34.0)
MCHC: 32.8 g/dL (ref 32.0–36.0)
MCV: 76.2 fL — AB (ref 80.0–100.0)
MONOS PCT: 3 %
Monocytes Absolute: 0.4 10*3/uL (ref 0.2–1.0)
NEUTROS PCT: 88 %
Neutro Abs: 11.8 10*3/uL — ABNORMAL HIGH (ref 1.4–6.5)
PLATELETS: 177 10*3/uL (ref 150–440)
RBC: 5.87 MIL/uL (ref 4.40–5.90)
RDW: 17.8 % — AB (ref 11.5–14.5)
WBC: 13.5 10*3/uL — AB (ref 3.8–10.6)

## 2018-06-23 LAB — BLOOD GAS, ARTERIAL
Acid-base deficit: 5.6 mmol/L — ABNORMAL HIGH (ref 0.0–2.0)
BICARBONATE: 18.3 mmol/L — AB (ref 20.0–28.0)
FIO2: 0.4
LHR: 14 {breaths}/min
MECHVT: 500 mL
O2 SAT: 94.2 %
PATIENT TEMPERATURE: 37
PCO2 ART: 31 mmHg — AB (ref 32.0–48.0)
PEEP: 5 cmH2O
PO2 ART: 73 mmHg — AB (ref 83.0–108.0)
pH, Arterial: 7.38 (ref 7.350–7.450)

## 2018-06-23 LAB — SALICYLATE LEVEL: Salicylate Lvl: <7 mg/dL (ref 2.8–30.0)

## 2018-06-23 LAB — PROTIME-INR
INR: 1.02
Prothrombin Time: 13.3 seconds (ref 11.4–15.2)

## 2018-06-23 LAB — ETHANOL: Alcohol, Ethyl (B): <10 mg/dL (ref <10)

## 2018-06-23 LAB — LACTIC ACID, PLASMA: Lactic Acid, Venous: 4.2 mmol/L (ref 0.5–1.9)

## 2018-06-23 LAB — ACETAMINOPHEN LEVEL: Acetaminophen (Tylenol), Serum: <10 ug/mL — ABNORMAL LOW (ref 10–30)

## 2018-06-23 LAB — LIPASE, BLOOD: Lipase: 24 U/L (ref 11–51)

## 2018-06-23 MED ORDER — VECURONIUM BROMIDE 10 MG IV SOLR
10.0000 mg | Freq: Once | INTRAVENOUS | Status: AC
  Administered 2018-06-23: 10 mg via INTRAVENOUS

## 2018-06-23 MED ORDER — ROCURONIUM BROMIDE 50 MG/5ML IV SOLN
100.0000 mg | Freq: Once | INTRAVENOUS | Status: AC
  Administered 2018-06-23: 100 mg via INTRAVENOUS

## 2018-06-23 MED ORDER — SODIUM CHLORIDE 0.9 % IV SOLN
2.0000 g | INTRAVENOUS | Status: DC
  Administered 2018-06-24: 2 g via INTRAVENOUS
  Filled 2018-06-23 (×3): qty 20

## 2018-06-23 MED ORDER — ETOMIDATE 2 MG/ML IV SOLN
20.0000 mg | Freq: Once | INTRAVENOUS | Status: AC
  Administered 2018-06-23: 20 mg via INTRAVENOUS

## 2018-06-23 MED ORDER — SODIUM CHLORIDE 0.9 % IV BOLUS (SEPSIS)
1000.0000 mL | Freq: Once | INTRAVENOUS | Status: AC
  Administered 2018-06-23: 1000 mL via INTRAVENOUS

## 2018-06-23 MED ORDER — PROPOFOL 1000 MG/100ML IV EMUL
5.0000 ug/kg/min | Freq: Once | INTRAVENOUS | Status: AC
  Administered 2018-06-23: 20 ug/kg/min via INTRAVENOUS
  Filled 2018-06-23: qty 100

## 2018-06-23 MED ORDER — ONDANSETRON HCL 4 MG/2ML IJ SOLN
INTRAMUSCULAR | Status: AC
  Filled 2018-06-23: qty 2

## 2018-06-23 MED ORDER — PROPOFOL 10 MG/ML IV BOLUS
90.0000 mg | Freq: Once | INTRAVENOUS | Status: AC
  Administered 2018-06-23: 90 mg via INTRAVENOUS

## 2018-06-23 MED ORDER — SODIUM CHLORIDE 0.9 % IV BOLUS
1000.0000 mL | Freq: Once | INTRAVENOUS | Status: AC
  Administered 2018-06-24: 1000 mL via INTRAVENOUS

## 2018-06-23 MED ORDER — SODIUM CHLORIDE 0.9 % IV BOLUS (SEPSIS)
1000.0000 mL | Freq: Once | INTRAVENOUS | Status: AC
  Administered 2018-06-24: 1000 mL via INTRAVENOUS

## 2018-06-23 MED ORDER — FENTANYL BOLUS VIA INFUSION
100.0000 ug | INTRAVENOUS | Status: AC
  Administered 2018-06-23: 100 ug via INTRAVENOUS
  Filled 2018-06-23: qty 100

## 2018-06-23 MED ORDER — SODIUM CHLORIDE 0.9 % IV SOLN
500.0000 mg | INTRAVENOUS | Status: DC
  Administered 2018-06-24: 500 mg via INTRAVENOUS
  Filled 2018-06-23 (×2): qty 500

## 2018-06-23 MED ORDER — ONDANSETRON HCL 4 MG/2ML IJ SOLN
4.0000 mg | Freq: Once | INTRAMUSCULAR | Status: AC
  Administered 2018-06-23: 4 mg via INTRAVENOUS

## 2018-06-23 MED ORDER — FENTANYL 2500MCG IN NS 250ML (10MCG/ML) PREMIX INFUSION
0.0000 ug/h | INTRAVENOUS | Status: DC
  Administered 2018-06-23: 100 ug/h via INTRAVENOUS
  Administered 2018-06-24: 300 ug/h via INTRAVENOUS
  Filled 2018-06-23 (×2): qty 250

## 2018-06-23 NOTE — ED Notes (Signed)
Dr. Karma Greaser speaking with family at bedside.

## 2018-06-23 NOTE — ED Triage Notes (Signed)
Patient coming in for AMS by [facility] called out by family for headache. Family denies fall, ETOH, or drugs. Patient not oriented. Patient is combative. Patient vitals for EMS140/80 BP, HR 116, CBG 101.
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
## 2018-06-23 NOTE — ED Notes (Signed)
Patient was confused and not making any sense. Family states he has been disoriented all day. Patient vomited a few minutes prior to intubation and stated he felt better but was still not making any sense. Family consented to intubation.

## 2018-06-23 NOTE — Progress Notes (Signed)
Transported pt to CT and back on ventilator without incident.
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
## 2018-06-24 ENCOUNTER — Inpatient Hospital Stay: Payer: Self-pay

## 2018-06-24 ENCOUNTER — Inpatient Hospital Stay: Payer: Medicare Other

## 2018-06-24 DIAGNOSIS — E872 Acidosis: Secondary | ICD-10-CM

## 2018-06-24 DIAGNOSIS — R410 Disorientation, unspecified: Secondary | ICD-10-CM

## 2018-06-24 DIAGNOSIS — J9601 Acute respiratory failure with hypoxia: Secondary | ICD-10-CM

## 2018-06-24 LAB — URINE DRUG SCREEN, QUALITATIVE (ARMC ONLY)
Amphetamines, Ur Screen: NOT DETECTED
BARBITURATES, UR SCREEN: NOT DETECTED
CANNABINOID 50 NG, UR ~~LOC~~: NOT DETECTED
Cocaine Metabolite,Ur ~~LOC~~: NOT DETECTED
MDMA (Ecstasy)Ur Screen: NOT DETECTED
METHADONE SCREEN, URINE: NOT DETECTED
OPIATE, UR SCREEN: NOT DETECTED
Phencyclidine (PCP) Ur S: NOT DETECTED
TRICYCLIC, UR SCREEN: NOT DETECTED

## 2018-06-24 LAB — PROCALCITONIN: Procalcitonin: <0.1 ng/mL

## 2018-06-24 LAB — MRSA PCR SCREENING: MRSA by PCR: NEGATIVE

## 2018-06-24 LAB — BASIC METABOLIC PANEL
ANION GAP: 10 (ref 5–15)
BUN: 16 mg/dL (ref 8–23)
CALCIUM: 8.4 mg/dL — AB (ref 8.9–10.3)
CHLORIDE: 113 mmol/L — AB (ref 98–111)
CO2: 20 mmol/L — AB (ref 22–32)
Creatinine, Ser: 1.53 mg/dL — ABNORMAL HIGH (ref 0.61–1.24)
GFR calc non Af Amer: 44 mL/min — ABNORMAL LOW (ref >60)
GFR, EST AFRICAN AMERICAN: 51 mL/min — AB (ref >60)
GLUCOSE: 133 mg/dL — AB (ref 70–99)
POTASSIUM: 4.7 mmol/L (ref 3.5–5.1)
Sodium: 143 mmol/L (ref 135–145)

## 2018-06-24 LAB — TRIGLYCERIDES: Triglycerides: 248 mg/dL — ABNORMAL HIGH (ref <150)

## 2018-06-24 LAB — CBC
HEMATOCRIT: 44.6 % (ref 40.0–52.0)
HEMOGLOBIN: 14.3 g/dL (ref 13.0–18.0)
MCH: 25.1 pg — AB (ref 26.0–34.0)
MCHC: 32 g/dL (ref 32.0–36.0)
MCV: 78.4 fL — ABNORMAL LOW (ref 80.0–100.0)
Platelets: 141 10*3/uL — ABNORMAL LOW (ref 150–440)
RBC: 5.69 MIL/uL (ref 4.40–5.90)
RDW: 17.8 % — ABNORMAL HIGH (ref 11.5–14.5)
WBC: 13.4 10*3/uL — ABNORMAL HIGH (ref 3.8–10.6)

## 2018-06-24 LAB — LACTIC ACID, PLASMA
Lactic Acid, Venous: 3.1 mmol/L (ref 0.5–1.9)
Lactic Acid, Venous: 0.9 mmol/L (ref 0.5–1.9)

## 2018-06-24 LAB — GLUCOSE, CAPILLARY: Glucose-Capillary: 124 mg/dL — ABNORMAL HIGH (ref 70–99)

## 2018-06-24 MED ORDER — DEXMEDETOMIDINE HCL IN NACL 400 MCG/100ML IV SOLN
0.4000 ug/kg/h | INTRAVENOUS | Status: DC
  Administered 2018-06-24: 0.4 ug/kg/h via INTRAVENOUS
  Administered 2018-06-24: 0.8 ug/kg/h via INTRAVENOUS
  Administered 2018-06-24: 0.4 ug/kg/h via INTRAVENOUS
  Filled 2018-06-24: qty 100

## 2018-06-24 MED ORDER — SODIUM CHLORIDE 0.9 % IV SOLN
1.0000 g | INTRAVENOUS | Status: DC
  Filled 2018-06-24: qty 10

## 2018-06-24 MED ORDER — BISACODYL 5 MG PO TBEC: 5.0000 mg | DELAYED_RELEASE_TABLET | Freq: Every day | ORAL | Status: DC | PRN

## 2018-06-24 MED ORDER — HEPARIN SODIUM (PORCINE) 5000 UNIT/ML IJ SOLN
5000.0000 [IU] | Freq: Three times a day (TID) | INTRAMUSCULAR | Status: DC
  Administered 2018-06-24 – 2018-06-25 (×4): 5000 [IU] via SUBCUTANEOUS
  Filled 2018-06-24 (×3): qty 1

## 2018-06-24 MED ORDER — HYDROCODONE-ACETAMINOPHEN 5-325 MG PO TABS: 1.0000 | ORAL_TABLET | ORAL | Status: DC | PRN

## 2018-06-24 MED ORDER — PROPOFOL 1000 MG/100ML IV EMUL
5.0000 ug/kg/min | INTRAVENOUS | Status: DC
  Administered 2018-06-24: 50 ug/kg/min via INTRAVENOUS
  Administered 2018-06-24: 30 ug/kg/min via INTRAVENOUS
  Filled 2018-06-24 (×4): qty 100

## 2018-06-24 MED ORDER — SODIUM CHLORIDE 0.9 % IV SOLN
500.0000 mg | INTRAVENOUS | Status: DC
  Filled 2018-06-24: qty 500

## 2018-06-24 MED ORDER — SENNOSIDES-DOCUSATE SODIUM 8.6-50 MG PO TABS: 1.0000 | ORAL_TABLET | Freq: Two times a day (BID) | ORAL | Status: DC

## 2018-06-24 MED ORDER — SODIUM CHLORIDE 0.9 % IV SOLN: 500.0000 mL | Freq: Once | INTRAVENOUS | Status: DC

## 2018-06-24 MED ORDER — SODIUM CHLORIDE 0.9 % IV SOLN
100.0000 mg | Freq: Two times a day (BID) | INTRAVENOUS | Status: DC
  Administered 2018-06-24 – 2018-06-25 (×2): 100 mg via INTRAVENOUS
  Filled 2018-06-24 (×3): qty 100

## 2018-06-24 MED ORDER — DOCUSATE SODIUM 100 MG PO CAPS: 100.0000 mg | ORAL_CAPSULE | Freq: Two times a day (BID) | ORAL | Status: DC

## 2018-06-24 MED ORDER — FAMOTIDINE 20 MG PO TABS
20.0000 mg | ORAL_TABLET | Freq: Every day | ORAL | Status: DC
  Administered 2018-06-24: 20 mg
  Filled 2018-06-24: qty 1

## 2018-06-24 MED ORDER — MORPHINE SULFATE (PF) 2 MG/ML IV SOLN
1.0000 mg | INTRAVENOUS | Status: DC | PRN
  Administered 2018-06-24: 2 mg via INTRAVENOUS
  Filled 2018-06-24: qty 1

## 2018-06-24 MED ORDER — ONDANSETRON HCL 4 MG/2ML IJ SOLN
4.0000 mg | Freq: Four times a day (QID) | INTRAMUSCULAR | Status: DC | PRN
  Administered 2018-06-25: 4 mg via INTRAVENOUS

## 2018-06-24 MED ORDER — ONDANSETRON HCL 4 MG PO TABS: 4.0000 mg | ORAL_TABLET | Freq: Four times a day (QID) | ORAL | Status: DC | PRN

## 2018-06-24 MED ORDER — SODIUM CHLORIDE 0.9 % IV SOLN
INTRAVENOUS | Status: DC
  Administered 2018-06-24: 02:00:00 via INTRAVENOUS
  Administered 2018-06-24: 100 mL/h via INTRAVENOUS
  Administered 2018-06-24: 21:00:00 via INTRAVENOUS

## 2018-06-24 MED ORDER — ACETAMINOPHEN 650 MG RE SUPP: 650.0000 mg | Freq: Four times a day (QID) | RECTAL | Status: DC | PRN

## 2018-06-24 MED ORDER — DEXTROSE 5 % IV SOLN
10.0000 mg/kg | Freq: Two times a day (BID) | INTRAVENOUS | Status: DC
  Administered 2018-06-24 (×2): 940 mg via INTRAVENOUS
  Filled 2018-06-24 (×4): qty 18.8

## 2018-06-24 MED ORDER — TRAZODONE HCL 50 MG PO TABS: 25.0000 mg | ORAL_TABLET | Freq: Every evening | ORAL | Status: DC | PRN

## 2018-06-24 MED ORDER — ACETAMINOPHEN 325 MG PO TABS
650.0000 mg | ORAL_TABLET | Freq: Four times a day (QID) | ORAL | Status: DC | PRN
  Administered 2018-06-25 (×2): 650 mg via ORAL
  Filled 2018-06-24 (×2): qty 2

## 2018-06-24 MED ORDER — VECURONIUM BROMIDE 10 MG IV SOLR
INTRAVENOUS | Status: AC
  Filled 2018-06-24: qty 10

## 2018-06-24 MED ORDER — MIDAZOLAM HCL 2 MG/2ML IJ SOLN
2.0000 mg | INTRAMUSCULAR | Status: DC | PRN
  Administered 2018-06-24: 2 mg via INTRAVENOUS
  Administered 2018-06-24: 1 mg via INTRAVENOUS
  Administered 2018-06-24 (×2): 2 mg via INTRAVENOUS
  Filled 2018-06-24 (×5): qty 2
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
## 2018-06-24 NOTE — Progress Notes (Signed)
1145 Down to MRI. Given Versed upon arrival for sedation. Tolerated procedure well. Back to room at 1245. Sedation weaning as verbally instructed per DR. Kasa. [place] off.

## 2018-06-24 NOTE — Progress Notes (Signed)
CODE SEPSIS - PHARMACY COMMUNICATION  **Broad Spectrum Antibiotics should be administered within 1 hour of Sepsis diagnosis**  Time Code Sepsis Called/Page Received: 0805 2321  Antibiotics Ordered: 6815 9470  Time of 1st antibiotic administration: 7615 1834 Per RN waiting on cultures  Additional action taken by pharmacy:   If necessary, Name of Provider/Nurse Contacted:     Eloise Harman ,PharmD Clinical Pharmacist  06/24/2018  12:31 AM

## 2018-06-24 NOTE — Progress Notes (Signed)
CHIEF COMPLAINT:   Chief Complaint  Patient presents with  . Altered Mental Status  . Headache    Subjective  72 yo WM admitted to ICU for severe resp failure and inability to protect airway Seen in ER for acute delirium and agitation ?etiology Patient was intubated in ER and placed on sedation and vent Critically ill, prognosis is guarded  LA elevated 4.1-->3.1 ARF creat 1.7 Tox NEG, ETOH NEG MRSA screen NEG WBC 13, HGB 14, PLT 114 CT head no acute process    REVIEW OF SYSTEMS  PATIENT IS UNABLE TO PROVIDE COMPLETE REVIEW OF SYSTEM S DUE TO SEVERE CRITICAL ILLNESS AND ENCEPHALOPATHY      Objective   PHYSICAL EXAMINATION:  GENERAL:critically ill appearing, +resp distress HEAD: Normocephalic, atraumatic.  EYES: Pupils equal, round, reactive to light.  No scleral icterus.  MOUTH: Moist mucosal membrane. NECK: Supple. No thyromegaly. No nodules. No JVD.  PULMONARY: +rhonchi, +wheezing CARDIOVASCULAR: S1 and S2. Regular rate and rhythm. No murmurs, rubs, or gallops.  GASTROINTESTINAL: Soft, nontender, -distended. No masses. Positive bowel sounds. No hepatosplenomegaly.  MUSCULOSKELETAL: No swelling, clubbing, or edema.  NEUROLOGIC: obtunded SKIN:intact,warm,dry    VITALS:  height is 5' 11"  (1.803 m) and weight is 207 lb 7.2 oz (94.1 kg). His axillary temperature is 98.6 F (37 C). His blood pressure is 81/49 (abnormal) and his pulse is 93. His respiration is 14 and oxygen saturation is 100%.   I personally reviewed Labs under Results section.  Radiology Reports Ct Head Wo Contrast  Result Date: 06/23/2018 CLINICAL DATA:  Acute onset of headache. Confusion. EXAM: CT HEAD WITHOUT CONTRAST TECHNIQUE: Contiguous axial images were obtained from the base of the skull through the vertex without intravenous contrast. COMPARISON:  None. FINDINGS: Brain: No evidence of acute infarction, hemorrhage, hydrocephalus, extra-axial collection or mass lesion/mass effect.  Prominence of the ventricles and sulci suggests mild cortical volume loss. Mild periventricular white matter change likely reflects small vessel ischemic microangiopathy. The posterior fossa, including the cerebellum, brainstem and fourth ventricle, is within normal limits. The basal ganglia are unremarkable in appearance. The cerebral hemispheres are symmetric in appearance, with normal gray-white differentiation. No mass effect or midline shift is seen. Vascular: No hyperdense vessel or unexpected calcification. Skull: There is no evidence of fracture; visualized osseous structures are unremarkable in appearance. Sinuses/Orbits: The visualized portions of the orbits are within normal limits. The paranasal sinuses and mastoid air cells are well-aerated. Other: No significant soft tissue abnormalities are seen. IMPRESSION: 1. No acute intracranial pathology seen on CT. 2. Mild cortical volume loss and scattered small vessel ischemic microangiopathy. Electronically Signed   By: Garald Balding M.D.   On: 06/23/2018 23:02   Dg Chest Portable 1 View  Result Date: 06/23/2018 CLINICAL DATA:  72 y/o  M; ET tube and OG tube placement. EXAM: PORTABLE CHEST 1 VIEW COMPARISON:  None. FINDINGS: Endotracheal tube tip projects 2.8 cm above the carina. Enteric tube tip extends below the field of view into the abdomen. Normal cardiac silhouette given projection and technique. Calcific aortic atherosclerosis. Reticular opacities of the lungs. No consolidation. No pleural effusion or pneumothorax identified. Bones are unremarkable. IMPRESSION: 1. Endotracheal tube tip projects 2.8 cm above the carina. 2. Enteric tube tip extends below field of view into the abdomen. 3. Reticular opacities of the lungs may represent atypical pneumonia or interstitial edema. Electronically Signed   By: Kristine Garbe M.D.   On: 06/23/2018 23:13   [place] Ekg Site Rite  Result Date: 06/24/2018  If [employer] not attached, placement could  not be confirmed due to current cardiac rhythm.      Assessment/Plan:  72 yo WM with acute and severe resp failure from acute and severe encephalopathy with metabolic acidisos and ARF  Severe Hypoxic and Hypercapnic Respiratory Failure -continue Full MV support -continue Bronchodilator Therapy -Wean Fio2 and PEEP as tolerated -will perform SAT/SBTTwhen respiratory parameters are met  Renal Failure-most likely due to ATN -follow chem 7 -follow UO -continue Foley Catheter-assess need  NEUROLOGY - intubated and sedated - minimal sedation to achieve a RASS goal: -1 Obtain MRI, EEG neurology consulted  START TF's DVT/GI prx   Critical Care Time devoted to patient care services described in this note is 45 minutes.   Overall, patient is critically ill, prognosis is guarded.  Patient with Multiorgan failure and at high risk for cardiac arrest and death.    Corrin Parker, M.D.  [facility]  Medical Director [place] Director [facility] Cardio-Pulmonary Department

## 2018-06-24 NOTE — Progress Notes (Signed)
Pt extubated to roomair without complications, sats 10%. Respiratory rate 22/min, HR 122/min, will continue to monitor.

## 2018-06-24 NOTE — Progress Notes (Signed)
Transported pt on vent from the ED to ICU 6 without incident.

## 2018-06-24 NOTE — ED Notes (Signed)
Patient

## 2018-06-24 NOTE — Progress Notes (Signed)
Per Juliene Pina, RN PICC is cancelled.  Carolee Rota, RN VAST

## 2018-06-24 NOTE — Progress Notes (Signed)
Spoke with Gretchin at [place] several times regarding inability to properly sedate patient due to low BP.  Dr. Gladys Damme entered orders.  Pt appears calm and in no distress at this time.  Will continue to monitor.

## 2018-06-24 NOTE — Progress Notes (Signed)
1500 Patient suddenly awake and fighting after sedation weaned and turned off. Dr. Mortimer Fries in to assess patient. 1508 Patient extubated per RT. Patient alert but unable to communicate except by saying "thank you, uhh huhh, all right and smiling. Appeared to still be agitated after several minutes. Verbal order for Precedx received from Dr. Mortimer Fries- medication started. Pulling at IV lines, clothes etc. Smiled at family but unable to name family members. B/P 144/87 pulse 113 and respirations 19. Precedex started at .31mcg/kg/min. Titrated up to .77mcg/kg/min when .4 mcg did not calm patient. Family with patient. Reoriented patient to person,place,and time. Also why he was presently in the hospital. Patient calming down slowly. Explained to family that confusion was common when patients were intubated and sedated.

## 2018-06-24 NOTE — Progress Notes (Signed)
Pharmacy Electrolyte Monitoring Consult:  Pharmacy consulted to assist in monitoring and replacing electrolytes in this 72 y.o. male admitted on 06/23/2018 with Altered Mental Status and Headache   Labs:  Sodium (mmol/L)  Date Value  06/24/2018 143  10/02/2017 141  11/11/2014 136   Potassium (mmol/L)  Date Value  06/24/2018 4.7  11/11/2014 3.4 (L)   Phosphorus (mg/dL)  Date Value  10/02/2017 3.9   Calcium (mg/dL)  Date Value  06/24/2018 8.4 (L)   Calcium, Total (mg/dL)  Date Value  11/11/2014 8.7   Albumin (g/dL)  Date Value  06/23/2018 4.4  10/02/2017 4.7  04/18/2012 4.2    Assessment/Plan: 1. Electrolytes: no replacement warranted. Will recheck electrolytes with am labs.   2. Glucose: will continue to monitor.   3. Constipation: senna/docusate 1 tab VT BID to start on 8/7.   Pharmacy will continue to monitor and adjust per consult.   Simpson,Michael L 06/24/2018 5:00 PM

## 2018-06-24 NOTE — Progress Notes (Signed)
Patient ID: Luis Strong, male   DOB: Feb 06, 1946, 72 y.o.   MRN: [id number]  [employer] [facility] PROGRESS NOTE  SURAFEL HILLEARY YKD:[id number] DOB: 05-04-1946 DOA: 06/23/2018 PCP: Juline Patch, MD  HPI/Subjective: Patient admitted with headache and nausea.  Wife ended up falling on the floor in the bathroom.  He was not making sense.  He was jabbering.  They brought him into the hospital and he was agitated and intubated.  Objective: Vitals:   06/24/18 1400 06/24/18 1510  BP: 108/65   Pulse: 85   Resp: 14   Temp:    SpO2: 96% 91%    Intake/Output Summary (Last 24 hours) at 06/24/2018 1547 Last data filed at 06/24/2018 1312 Gross per 24 hour  Intake 6621.83 ml  Output 405 ml  Net 6216.83 ml   Filed Weights   06/23/18 2231 06/24/18 0054 06/24/18 0500  Weight: 90.7 kg (200 lb) 94.1 kg (207 lb 7.3 oz) 94.1 kg (207 lb 7.2 oz)    ROS: Review of Systems  Unable to perform ROS: Intubated   Exam: Physical Exam  HENT:  Nose: No mucosal edema.  Eyes: Pupils are equal, round, and reactive to light. Lids are normal.  Neck: Trachea normal. Carotid bruit is not present.  Cardiovascular: Regular rhythm, S1 normal, S2 normal and normal heart sounds.  Pulses:      Dorsalis pedis pulses are 2+ on the right side, and 2+ on the left side.  Respiratory: He has no decreased breath sounds. He has no wheezes. He has no rhonchi. He has no rales.  GI: Soft. He exhibits no distension. There is no tenderness.  Musculoskeletal:       Right ankle: He exhibits no swelling.       Left ankle: He exhibits no swelling.  Neurological: He is unresponsive.  Patient sedated  Skin: Skin is warm. No rash noted. Nails show no clubbing.  Psychiatric:  Unable to assess at this time.      Data Reviewed: Basic Metabolic Panel: Recent Labs  Lab 06/23/18 2233 06/24/18 0105  NA 140 143  K 3.9 4.7  CL 109 113*  CO2 18* 20*  GLUCOSE 156* 133*  BUN 16 16  CREATININE 1.70* 1.53*  CALCIUM 9.3 8.4*    Liver Function Tests: Recent Labs  Lab 06/23/18 2233  AST 52*  ALT 32  ALKPHOS 54  BILITOT 1.5*  PROT 7.6  ALBUMIN 4.4   Recent Labs  Lab 06/23/18 2233  LIPASE 24   CBC: Recent Labs  Lab 06/23/18 2233 06/24/18 0105  WBC 13.5* 13.4*  NEUTROABS 11.8*  --   HGB 14.7 14.3  HCT 44.7 44.6  MCV 76.2* 78.4*  PLT 177 141*   Cardiac Enzymes: Recent Labs  Lab 06/23/18 2233  TROPONINI <0.03    CBG: Recent Labs  Lab 06/24/18 0104  GLUCAP 124*    Recent Results (from the past 240 hour(s))  Blood Culture (routine x 2)     Status: None (Preliminary result)   Collection Time: 06/23/18 11:59 PM  Result Value Ref Range Status   Specimen Description BLOOD LEFT FATTY CASTS  Final   Special Requests   Final    BOTTLES DRAWN AEROBIC AND ANAEROBIC Blood Culture adequate volume   Culture   Final    NO GROWTH < 12 HOURS Performed at [facility], [address], [place], [place] [address]    Report Status PENDING  Incomplete  Blood Culture (routine x 2)  Status: None (Preliminary result)   Collection Time: 06/24/18  1:05 AM  Result Value Ref Range Status   Specimen Description BLOOD BLOOD LEFT HAND  Final   Special Requests   Final    BOTTLES DRAWN AEROBIC AND ANAEROBIC Blood Culture results may not be optimal due to an inadequate volume of blood received in culture bottles   Culture   Final    NO GROWTH < 12 HOURS Performed at [facility], [address] [place], [place] [address]    Report Status PENDING  Incomplete  MRSA PCR Screening     Status: None   Collection Time: 06/24/18  1:16 AM  Result Value Ref Range Status   MRSA by PCR NEGATIVE NEGATIVE Final    Comment:        The GeneXpert MRSA Assay (FDA approved for NASAL specimens only), is one component of a comprehensive MRSA colonization surveillance program. It is not intended to diagnose MRSA infection nor to guide or monitor treatment for MRSA infections. Performed at  [facility], [address] [place], [place] [address]      Studies: Ct Head Wo Contrast  Result Date: 06/23/2018 CLINICAL DATA:  Acute onset of headache. Confusion. EXAM: CT HEAD WITHOUT CONTRAST TECHNIQUE: Contiguous axial images were obtained from the base of the skull through the vertex without intravenous contrast. COMPARISON:  None. FINDINGS: Brain: No evidence of acute infarction, hemorrhage, hydrocephalus, extra-axial collection or mass lesion/mass effect. Prominence of the ventricles and sulci suggests mild cortical volume loss. Mild periventricular white matter change likely reflects small vessel ischemic microangiopathy. The posterior fossa, including the cerebellum, brainstem and fourth ventricle, is within normal limits. The basal ganglia are unremarkable in appearance. The cerebral hemispheres are symmetric in appearance, with normal gray-white differentiation. No mass effect or midline shift is seen. Vascular: No hyperdense vessel or unexpected calcification. Skull: There is no evidence of fracture; visualized osseous structures are unremarkable in appearance. Sinuses/Orbits: The visualized portions of the orbits are within normal limits. The paranasal sinuses and mastoid air cells are well-aerated. Other: No significant soft tissue abnormalities are seen. IMPRESSION: 1. No acute intracranial pathology seen on CT. 2. Mild cortical volume loss and scattered small vessel ischemic microangiopathy. Electronically Signed   By: Garald Balding M.D.   On: 06/23/2018 23:02   Mr Brain Wo Contrast  Result Date: 06/24/2018 CLINICAL DATA:  Intubated patient. Previous presentation for severe headache and altered mental status. EXAM: MRI HEAD WITHOUT CONTRAST TECHNIQUE: Multiplanar, multiecho pulse sequences of the brain and surrounding structures were obtained without intravenous contrast. COMPARISON:  Head CT 06/23/2018 FINDINGS: Brain: Diffusion imaging does not show any acute or subacute  infarction. There are mild chronic appearing small vessel changes of the pons. No focal cerebellar insult. Cerebral hemispheres show minimal small vessel change of the white matter. No cortical or large vessel territory infarction. No mass lesion, hemorrhage, hydrocephalus or extra-axial collection. Vascular: Major vessels at the base of the brain show flow. Skull and upper cervical spine: Negative Sinuses/Orbits: Mild inflammatory changes of the paranasal sinuses, possibly subclinical. Orbits negative. Other: None IMPRESSION: Negative study. Minimal small vessel change of the pons and cerebral hemispheric white matter, less than often seen at this age. Electronically Signed   By: Nelson Chimes M.D.   On: 06/24/2018 13:14   Dg Chest Portable 1 View  Result Date: 06/23/2018 CLINICAL DATA:  72 y/o  M; ET tube and OG tube placement. EXAM: PORTABLE CHEST 1 VIEW COMPARISON:  None. FINDINGS:  Endotracheal tube tip projects 2.8 cm above the carina. Enteric tube tip extends below the field of view into the abdomen. Normal cardiac silhouette given projection and technique. Calcific aortic atherosclerosis. Reticular opacities of the lungs. No consolidation. No pleural effusion or pneumothorax identified. Bones are unremarkable. IMPRESSION: 1. Endotracheal tube tip projects 2.8 cm above the carina. 2. Enteric tube tip extends below field of view into the abdomen. 3. Reticular opacities of the lungs may represent atypical pneumonia or interstitial edema. Electronically Signed   By: Kristine Garbe M.D.   On: 06/23/2018 23:13   [place] Ekg Site Rite  Result Date: 06/24/2018 If Site Rite image not attached, placement could not be confirmed due to current cardiac rhythm.   Scheduled Meds: . docusate sodium  100 mg Oral BID  . famotidine  20 mg Per Tube Daily  . heparin  5,000 Units Subcutaneous Q8H  . vecuronium       Continuous Infusions: . sodium chloride 100 mL/hr (06/24/18 0739)  . sodium chloride 0.9% NICU  IV bolus    . acyclovir    . dexmedetomidine (PRECEDEX) IV infusion 0.8 mcg/kg/hr (06/24/18 1524)  . doxycycline (VIBRAMYCIN) IV    . fentaNYL infusion INTRAVENOUS Stopped (06/24/18 1412)  . propofol (DIPRIVAN) infusion Stopped (06/24/18 1311)    Assessment/Plan:  1. Clinical sepsis.  Could be from pneumonia or encephalitis.  Patient on acyclovir and doxycycline.  Tickborne studies ordered. 2. Acute encephalopathy.  Likely from sepsis.  Currently on the ventilator. 3. Acute hypoxic respiratory failure.  Continue ventilator support.  Mental status will need to be better prior to coming off the ventilator. 4. Acute kidney injury.  Improving with IV fluids. 5. GERD on famotidine 6. Depression.  Will hopefully need to start Celexa back at some point. 7. Hyperlipidemia unspecified.  Zocor on hold. 8. History of hypertension.  Toprol on hold.  Code Status:     Code Status Orders  (From admission, onward)        Start     Ordered   06/24/18 0118  Full code  Continuous     06/24/18 0117    Code Status History    This patient has a current code status but no historical code status.    Advance Directive Documentation     Most Recent Value  Type of Advance Directive  Healthcare Power of Attorney, Living will  Pre-existing out of facility DNR order (yellow form or pink MOST form)  -  "MOST" Form in Place?  -     Family Communication: Spoke with wife at the bedside Disposition Plan: To be determined  Consultants:  Critical care specialist  Neurology  Antibiotics:  Acyclovir  Doxycycline  Time spent: 28 minutes  [place]

## 2018-06-24 NOTE — Progress Notes (Signed)
Busy day. Precedex now on .8mcg/kg/min. Patient calmer and more cooperative.Wife still nervous but better.VSS. Answers simple questions immediately.

## 2018-06-24 NOTE — Progress Notes (Signed)
[place] Progress Note Patient Name: Luis Strong DOB: 1946-11-16 MRN: [id number]   Date of Service  06/24/2018  HPI/Events of Note  BP 84/51  eICU Interventions  Decrease propofol and give ns bolus 500 cc     Intervention Category Intermediate Interventions: Hypovolemia - evaluation and management;Hypotension - evaluation and management  Sharia Reeve 06/24/2018, 4:58 AM

## 2018-06-24 NOTE — Progress Notes (Signed)
1615 calmer now. Able to answer questions about Lexicographer.

## 2018-06-25 ENCOUNTER — Other Ambulatory Visit: Payer: Self-pay | Admitting: Internal Medicine

## 2018-06-25 ENCOUNTER — Inpatient Hospital Stay: Payer: Medicare Other

## 2018-06-25 LAB — URINE CULTURE: Culture: NO GROWTH

## 2018-06-25 LAB — BASIC METABOLIC PANEL
Anion gap: 7 (ref 5–15)
BUN: 18 mg/dL (ref 8–23)
CO2: 20 mmol/L — AB (ref 22–32)
CREATININE: 1.56 mg/dL — AB (ref 0.61–1.24)
Calcium: 8.1 mg/dL — ABNORMAL LOW (ref 8.9–10.3)
Chloride: 115 mmol/L — ABNORMAL HIGH (ref 98–111)
GFR calc non Af Amer: 43 mL/min — ABNORMAL LOW (ref >60)
GFR, EST AFRICAN AMERICAN: 49 mL/min — AB (ref >60)
Glucose, Bld: 95 mg/dL (ref 70–99)
Potassium: 3.7 mmol/L (ref 3.5–5.1)
SODIUM: 142 mmol/L (ref 135–145)

## 2018-06-25 LAB — B. BURGDORFI ANTIBODIES: B burgdorferi Ab IgG+IgM: <0.91 {ISR} (ref 0.00–0.90)

## 2018-06-25 LAB — GLUCOSE, CAPILLARY: GLUCOSE-CAPILLARY: 88 mg/dL (ref 70–99)

## 2018-06-25 MED ORDER — SENNOSIDES-DOCUSATE SODIUM 8.6-50 MG PO TABS
1.0000 | ORAL_TABLET | Freq: Two times a day (BID) | ORAL | Status: DC
  Administered 2018-06-25 – 2018-06-26 (×3): 1 via ORAL
  Filled 2018-06-25 (×3): qty 1

## 2018-06-25 MED ORDER — POTASSIUM CHLORIDE CRYS ER 20 MEQ PO TBCR
40.0000 meq | EXTENDED_RELEASE_TABLET | Freq: Once | ORAL | Status: AC
  Administered 2018-06-25: 40 meq via ORAL
  Filled 2018-06-25: qty 2

## 2018-06-25 MED ORDER — METOPROLOL SUCCINATE ER 25 MG PO TB24
25.0000 mg | ORAL_TABLET | Freq: Every day | ORAL | Status: DC
  Administered 2018-06-25 – 2018-06-26 (×2): 25 mg via ORAL
  Filled 2018-06-25 (×2): qty 1

## 2018-06-25 MED ORDER — ONDANSETRON HCL 4 MG/2ML IJ SOLN
INTRAMUSCULAR | Status: AC
  Administered 2018-06-25: 4 mg via INTRAVENOUS
  Filled 2018-06-25: qty 2

## 2018-06-25 MED ORDER — DEXTROSE 5 % IV SOLN
1000.0000 mg | Freq: Three times a day (TID) | INTRAVENOUS | Status: DC
  Administered 2018-06-25 – 2018-06-26 (×2): 1000 mg via INTRAVENOUS
  Filled 2018-06-25 (×5): qty 20

## 2018-06-25 MED ORDER — SIMVASTATIN 20 MG PO TABS
40.0000 mg | ORAL_TABLET | Freq: Every day | ORAL | Status: DC
  Administered 2018-06-25: 40 mg via ORAL

## 2018-06-25 MED ORDER — DOXYCYCLINE HYCLATE 100 MG PO TABS
100.0000 mg | ORAL_TABLET | Freq: Two times a day (BID) | ORAL | Status: DC
  Administered 2018-06-25 – 2018-06-26 (×3): 100 mg via ORAL
  Filled 2018-06-25 (×4): qty 1

## 2018-06-25 MED ORDER — ONDANSETRON HCL 4 MG/2ML IJ SOLN
4.0000 mg | Freq: Four times a day (QID) | INTRAMUSCULAR | Status: DC | PRN
  Administered 2018-06-25: 4 mg via INTRAVENOUS
  Filled 2018-06-25 (×2): qty 2

## 2018-06-25 MED ORDER — FAMOTIDINE 20 MG PO TABS
20.0000 mg | ORAL_TABLET | Freq: Every day | ORAL | Status: DC
  Administered 2018-06-25: 20 mg via ORAL
  Filled 2018-06-25: qty 1

## 2018-06-25 MED ORDER — CITALOPRAM HYDROBROMIDE 20 MG PO TABS
10.0000 mg | ORAL_TABLET | Freq: Every day | ORAL | Status: DC
  Administered 2018-06-25 – 2018-06-26 (×2): 10 mg via ORAL
  Filled 2018-06-25 (×2): qty 1

## 2018-06-25 MED ORDER — FAMOTIDINE 20 MG PO TABS
20.0000 mg | ORAL_TABLET | Freq: Two times a day (BID) | ORAL | Status: DC
  Administered 2018-06-25 – 2018-06-26 (×2): 20 mg via ORAL
  Filled 2018-06-25 (×2): qty 1

## 2018-06-25 MED ORDER — TRAZODONE HCL 50 MG PO TABS
50.0000 mg | ORAL_TABLET | Freq: Every day | ORAL | Status: DC
  Administered 2018-06-25: 50 mg via ORAL
  Filled 2018-06-25: qty 1

## 2018-06-25 MED ORDER — DEXTROSE 5 % IV SOLN
940.0000 mg | Freq: Once | INTRAVENOUS | Status: AC
  Administered 2018-06-25: 940 mg via INTRAVENOUS
  Filled 2018-06-25: qty 18.8

## 2018-06-25 MED ORDER — ENOXAPARIN SODIUM 40 MG/0.4ML ~~LOC~~ SOLN
40.0000 mg | SUBCUTANEOUS | Status: DC
  Administered 2018-06-25: 21:00:00 40 mg via SUBCUTANEOUS
  Filled 2018-06-25: qty 0.4

## 2018-06-25 MED ORDER — ASPIRIN EC 81 MG PO TBEC
81.0000 mg | DELAYED_RELEASE_TABLET | Freq: Every day | ORAL | Status: DC
  Administered 2018-06-25 – 2018-06-26 (×2): 81 mg via ORAL
  Filled 2018-06-25 (×2): qty 1

## 2018-06-25 MED ORDER — TRAZODONE HCL 50 MG PO TABS: 50.0000 mg | ORAL_TABLET | Freq: Every evening | ORAL | Status: DC | PRN

## 2018-06-25 NOTE — Procedures (Signed)
ELECTROENCEPHALOGRAM REPORT   Patient: Luis Strong       Room #: IC06A-AA EEG No. ID: [id number] Age: 72 y.o.        Sex: male Referring Physician: Kasa Report Date:  06/25/2018        Interpreting Physician: Alexis Goodell  History: [name] is an 72 y.o. male with altered mental status, nausea and vomiting  Medications:  Zovirax, ASA, Doxycycline, Pepcid, Toprol, Senokot, Zocor, Desyrel  Conditions of Recording:  This is a 21 channel routine scalp EEG performed with bipolar and monopolar montages arranged in accordance to the international 10/20 system of electrode placement. One channel was dedicated to EKG recording.  The patient is in the awake and drowsy states.  Description:  The waking background activity consists of a low voltage, symmetrical, fairly well organized, 9 Hz alpha activity, seen from the parieto-occipital and posterior temporal regions.  Low voltage fast activity, poorly organized, is seen anteriorly and is at times superimposed on more posterior regions.  A mixture of theta and alpha rhythms are seen from the central and temporal regions. The patient drowses with slowing to irregular, low voltage theta and beta activity.   Stage II sleep is not obtained. No epileptiform activity is noted.   Hyperventilation and intermittent photic stimulation were not performed.  IMPRESSION: Normal electroencephalogram, awake and drowsy. There are no focal lateralizing or epileptiform features.   Alexis Goodell, MD Neurology [phone] 06/25/2018, 2:45 PM

## 2018-06-25 NOTE — Progress Notes (Signed)
Placed pt on [facility] AUTO BIPAP. BIPAP is plugged into red outlet. Pt is unsure of setting so AUTO BIPAP setup. Wife is at bedside

## 2018-06-25 NOTE — Progress Notes (Addendum)
Patient ID: Luis Strong, male   DOB: Apr 14, 1946, 72 y.o.   MRN: [id number]  [employer] PROGRESS NOTE  Luis Strong ION:[id number] DOB: July 29, 1946 DOA: 06/23/2018 PCP: Juline Patch, MD  HPI/Subjective: Patient feels okay.  Still has some nausea and headache.  States he has some memory issues to start with.  Upset about the diagnosis of his wife with cancer and some stress with their children.  States he was up in [place] and may have gotten bitten by mosquitoes and ticks.  Objective: Vitals:   06/25/18 1300 06/25/18 1400  BP:  (!) 164/87  Pulse: (!) 102 (!) 108  Resp: 19 16  Temp:    SpO2: 96% 100%    Filed Weights   06/24/18 0054 06/24/18 0500 06/25/18 0418  Weight: 94.1 kg (207 lb 7.3 oz) 94.1 kg (207 lb 7.2 oz) 100 kg (220 lb 7.4 oz)    ROS: Review of Systems  Constitutional: Negative for chills and fever.  Eyes: Negative for blurred vision.  Respiratory: Negative for cough and shortness of breath.   Cardiovascular: Negative for chest pain.  Gastrointestinal: Positive for nausea. Negative for abdominal pain, constipation, diarrhea and vomiting.  Genitourinary: Negative for dysuria.  Musculoskeletal: Negative for joint pain.  Neurological: Positive for headaches. Negative for dizziness.   Exam: Physical Exam  HENT:  Nose: No mucosal edema.  Mouth/Throat: No oropharyngeal exudate.  Eyes: Pupils are equal, round, and reactive to light. EOM and lids are normal.  Neck: Trachea normal. Carotid bruit is not present.  Cardiovascular: Regular rhythm, S1 normal, S2 normal and normal heart sounds.  Pulses:      Dorsalis pedis pulses are 2+ on the right side, and 2+ on the left side.  Respiratory: He has decreased breath sounds in the right lower field and the left lower field. He has no wheezes. He has rhonchi in the right lower field and the left lower field. He has no rales.  GI: Soft. He exhibits no distension. There is no tenderness.  Musculoskeletal:   Right ankle: He exhibits no swelling.       Left ankle: He exhibits no swelling.  Neurological: He is alert.  Moves all extremities on his own  Skin: Skin is warm. No rash noted. Nails show no clubbing.  Psychiatric: He has a normal mood and affect.      Data Reviewed: Basic Metabolic Panel: Recent Labs  Lab 06/23/18 2233 06/24/18 0105 06/25/18 0537  NA 140 143 142  K 3.9 4.7 3.7  CL 109 113* 115*  CO2 18* 20* 20*  GLUCOSE 156* 133* 95  BUN 16 16 18   CREATININE 1.70* 1.53* 1.56*  CALCIUM 9.3 8.4* 8.1*   Liver Function Tests: Recent Labs  Lab 06/23/18 2233  AST 52*  ALT 32  ALKPHOS 54  BILITOT 1.5*  PROT 7.6  ALBUMIN 4.4   Recent Labs  Lab 06/23/18 2233  LIPASE 24   CBC: Recent Labs  Lab 06/23/18 2233 06/24/18 0105  WBC 13.5* 13.4*  NEUTROABS 11.8*  --   HGB 14.7 14.3  HCT 44.7 44.6  MCV 76.2* 78.4*  PLT 177 141*   Cardiac Enzymes: Recent Labs  Lab 06/23/18 2233  TROPONINI <0.03    CBG: Recent Labs  Lab 06/24/18 0104 06/25/18 0716  GLUCAP 124* 88    Recent Results (from the past 240 hour(s))  Urine culture     Status: None   Collection Time: 06/23/18 11:07 PM  Result Value Ref Range Status  Specimen Description   Final    URINE, CATHETERIZED Performed at [facility], [address], [place], [place] [address]    Special Requests   Final    NONE Performed at [facility], [address] [place], [place] [address]    Culture   Final    NO GROWTH Performed at [facility], [place] [address], [place], [place] [address]    Report Status 06/25/2018 FINAL  Final  Blood Culture (routine x 2)     Status: None (Preliminary result)   Collection Time: 06/23/18 11:59 PM  Result Value Ref Range Status   Specimen Description BLOOD LEFT FATTY CASTS  Final   Special Requests   Final    BOTTLES DRAWN AEROBIC AND ANAEROBIC Blood Culture adequate volume   Culture   Final    NO GROWTH 1 DAY Performed at [facility], [address], [place], [place] [address]    Report Status PENDING  Incomplete  Blood Culture (routine x 2)     Status: None (Preliminary result)   Collection Time: 06/24/18  1:05 AM  Result Value Ref Range Status   Specimen Description BLOOD BLOOD LEFT HAND  Final   Special Requests   Final    BOTTLES DRAWN AEROBIC AND ANAEROBIC Blood Culture results may not be optimal due to an inadequate volume of blood received in culture bottles   Culture   Final    NO GROWTH 1 DAY Performed at [facility], [address] [place], [place] [address]    Report Status PENDING  Incomplete  MRSA PCR Screening     Status: None   Collection Time: 06/24/18  1:16 AM  Result Value Ref Range Status   MRSA by PCR NEGATIVE NEGATIVE Final    Comment:        The GeneXpert MRSA Assay (FDA approved for NASAL specimens only), is one component of a comprehensive MRSA colonization surveillance program. It is not intended to diagnose MRSA infection nor to guide or monitor treatment for MRSA infections. Performed at [facility], [address], [place], [place] [address]      Studies: Ct Head Wo Contrast  Result Date: 06/23/2018 CLINICAL DATA:  Acute onset of headache. Confusion. EXAM: CT HEAD WITHOUT CONTRAST TECHNIQUE: Contiguous axial images were obtained from the base of the skull through the vertex without intravenous contrast. COMPARISON:  None. FINDINGS: Brain: No evidence of acute infarction, hemorrhage, hydrocephalus, extra-axial collection or mass lesion/mass effect. Prominence of the ventricles and sulci suggests mild cortical volume loss. Mild periventricular white matter change likely reflects small vessel ischemic microangiopathy. The posterior fossa, including the cerebellum, brainstem and fourth ventricle, is within normal limits. The basal ganglia are unremarkable in appearance. The cerebral hemispheres are symmetric in appearance, with normal gray-white  differentiation. No mass effect or midline shift is seen. Vascular: No hyperdense vessel or unexpected calcification. Skull: There is no evidence of fracture; visualized osseous structures are unremarkable in appearance. Sinuses/Orbits: The visualized portions of the orbits are within normal limits. The paranasal sinuses and mastoid air cells are well-aerated. Other: No significant soft tissue abnormalities are seen. IMPRESSION: 1. No acute intracranial pathology seen on CT. 2. Mild cortical volume loss and scattered small vessel ischemic microangiopathy. Electronically Signed   By: Garald Balding M.D.   On: 06/23/2018 23:02   Mr Brain Wo Contrast  Result Date: 06/24/2018 CLINICAL DATA:  Intubated patient. Previous presentation for severe headache and altered mental status. EXAM:  MRI HEAD WITHOUT CONTRAST TECHNIQUE: Multiplanar, multiecho pulse sequences of the brain and surrounding structures were obtained without intravenous contrast. COMPARISON:  Head CT 06/23/2018 FINDINGS: Brain: Diffusion imaging does not show any acute or subacute infarction. There are mild chronic appearing small vessel changes of the pons. No focal cerebellar insult. Cerebral hemispheres show minimal small vessel change of the white matter. No cortical or large vessel territory infarction. No mass lesion, hemorrhage, hydrocephalus or extra-axial collection. Vascular: Major vessels at the base of the brain show flow. Skull and upper cervical spine: Negative Sinuses/Orbits: Mild inflammatory changes of the paranasal sinuses, possibly subclinical. Orbits negative. Other: None IMPRESSION: Negative study. Minimal small vessel change of the pons and cerebral hemispheric white matter, less than often seen at this age. Electronically Signed   By: Nelson Chimes M.D.   On: 06/24/2018 13:14   Dg Chest Portable 1 View  Result Date: 06/23/2018 CLINICAL DATA:  72 y/o  M; ET tube and OG tube placement. EXAM: PORTABLE CHEST 1 VIEW COMPARISON:  None.  FINDINGS: Endotracheal tube tip projects 2.8 cm above the carina. Enteric tube tip extends below the field of view into the abdomen. Normal cardiac silhouette given projection and technique. Calcific aortic atherosclerosis. Reticular opacities of the lungs. No consolidation. No pleural effusion or pneumothorax identified. Bones are unremarkable. IMPRESSION: 1. Endotracheal tube tip projects 2.8 cm above the carina. 2. Enteric tube tip extends below field of view into the abdomen. 3. Reticular opacities of the lungs may represent atypical pneumonia or interstitial edema. Electronically Signed   By: Kristine Garbe M.D.   On: 06/23/2018 23:13   [place] Ekg Site Rite  Result Date: 06/24/2018 If Site Rite image not attached, placement could not be confirmed due to current cardiac rhythm.   Scheduled Meds: . aspirin EC  81 mg Oral Daily  . citalopram  10 mg Oral Daily  . doxycycline  100 mg Oral Q12H  . famotidine  20 mg Oral BID  . heparin  5,000 Units Subcutaneous Q8H  . metoprolol succinate  25 mg Oral Daily  . senna-docusate  1 tablet Oral BID  . simvastatin  40 mg Oral q1800  . traZODone  50 mg Oral QHS   Continuous Infusions: . acyclovir      Assessment/Plan:  1. Clinical sepsis.  Could be from pneumonia or encephalitis.  Patient on acyclovir and doxycycline.  Tickborne studies ordered.  Patient clinically improved. 2. Acute encephalopathy.  Likely from sepsis.   3. Acute hypoxic respiratory failure.  Extubated yesterday afternoon and currently breathing on room air. 4. Acute kidney injury on chronic kidney disease stage III.  Continue to monitor. 5. GERD on famotidine 6. Depression, insomnia.  Celexa restarted.  Agree with trazodone for sleep. 7. Hyperlipidemia unspecified.  Zocor restarted 8. History of hypertension.  Toprol restarted  Code Status:     Code Status Orders  (From admission, onward)        Start     Ordered   06/24/18 0118  Full code  Continuous      06/24/18 0117    Code Status History    This patient has a current code status but no historical code status.    Advance Directive Documentation     Most Recent Value  Type of Advance Directive  Healthcare Power of Attorney, Living will  Pre-existing out of facility DNR order (yellow form or pink MOST form)  -  "MOST" Form in Place?  -     Family Communication:  Spoke with wife at the bedside Disposition Plan: Out of ICU today  Consultants:  Critical care specialist  Neurology  Antibiotics:  Acyclovir  Doxycycline  Time spent: 28 minutes  [place]

## 2018-06-25 NOTE — Progress Notes (Signed)
Subjective: Patient improved.  Extubated.  Some confusion noted.    Objective: Current vital signs: BP (!) 165/85   Pulse 93   Temp 98.8 F (37.1 C) (Oral)   Resp 17   Ht 5\' 11"  (1.803 m)   Wt 100 kg (220 lb 7.4 oz)   SpO2 93%   BMI 30.75 kg/m  Vital signs in last 24 hours: Temp:  [98.4 F (36.9 C)-99.6 F (37.6 C)] 98.8 F (37.1 C) (08/07 0800) Pulse Rate:  [67-113] 93 (08/07 1100) Resp:  [13-29] 17 (08/07 1100) BP: (86-165)/(46-92) 165/85 (08/07 1000) SpO2:  [88 %-100 %] 93 % (08/07 1100) FiO2 (%):  [21 %-28 %] 21 % (08/06 1510) Weight:  [100 kg (220 lb 7.4 oz)] 100 kg (220 lb 7.4 oz) (08/07 0418)  Intake/Output from previous day: 08/06 0701 - 08/07 0700 In: 3748.3 [I.V.:3348.3; IV Piggyback:400] Out: 4008 [Urine:1570] Intake/Output this shift: Total I/O In: 620 [P.O.:120; I.V.:500] Out: 525 [Urine:525] Nutritional status:  Diet Order           Diet 2 gram sodium Room service appropriate? Yes; Fluid consistency: Thin  Diet effective now          Neurologic Exam: Mental Status: Alert, oriented to name and place but requires extensive reinforcement for 3-step commands. Speech fluent without evidence of aphasia.  Cranial Nerves: II: Discs flat bilaterally; Visual fields grossly normal, pupils equal, round, reactive to light and accommodation III,IV, VI: ptosis not present, extra-ocular motions intact bilaterally V,VII: smile symmetric, facial light touch sensation normal bilaterally VIII: hearing normal bilaterally IX,X: gag reflex present XI: bilateral shoulder shrug XII: midline tongue extension Motor: Right : Upper extremity   5/5    Left:     Upper extremity   5/5  Lower extremity   5/5     Lower extremity   5/5 Tone and bulk:normal tone throughout; no atrophy noted Sensory: Pinprick and light touch intact throughout, bilaterally   Lab Results: Basic Metabolic Panel: Recent Labs  Lab 06/23/18 2233 06/24/18 0105 06/25/18 0537  NA 140 143 142  K  3.9 4.7 3.7  CL 109 113* 115*  CO2 18* 20* 20*  GLUCOSE 156* 133* 95  BUN 16 16 18   CREATININE 1.70* 1.53* 1.56*  CALCIUM 9.3 8.4* 8.1*    Liver Function Tests: Recent Labs  Lab 06/23/18 2233  AST 52*  ALT 32  ALKPHOS 54  BILITOT 1.5*  PROT 7.6  ALBUMIN 4.4   Recent Labs  Lab 06/23/18 2233  LIPASE 24   No results for input(s): AMMONIA in the last 168 hours.  CBC: Recent Labs  Lab 06/23/18 2233 06/24/18 0105  WBC 13.5* 13.4*  NEUTROABS 11.8*  --   HGB 14.7 14.3  HCT 44.7 44.6  MCV 76.2* 78.4*  PLT 177 141*    Cardiac Enzymes: Recent Labs  Lab 06/23/18 2233  TROPONINI <0.03    Lipid Panel: Recent Labs  Lab 06/24/18 0105  TRIG 248*    CBG: Recent Labs  Lab 06/24/18 0104 06/25/18 0716  GLUCAP 124* 19    Microbiology: Results for orders placed or performed during the hospital encounter of 06/23/18  Urine culture     Status: None   Collection Time: 06/23/18 11:07 PM  Result Value Ref Range Status   Specimen Description   Final    URINE, CATHETERIZED Performed at [facility], [address], [place],  [address]    Special Requests   Final    NONE Performed at [facility]  [facility], [address] [place], [place] [address]    Culture   Final    NO GROWTH Performed at [facility], [place] [address] [place], [place] [address]    Report Status 06/25/2018 FINAL  Final  Blood Culture (routine x 2)     Status: None (Preliminary result)   Collection Time: 06/23/18 11:59 PM  Result Value Ref Range Status   Specimen Description BLOOD LEFT FATTY CASTS  Final   Special Requests   Final    BOTTLES DRAWN AEROBIC AND ANAEROBIC Blood Culture adequate volume   Culture   Final    NO GROWTH 1 DAY Performed at [facility], [address], [place], [place] [address]    Report Status PENDING  Incomplete  Blood Culture (routine x 2)     Status: None (Preliminary result)   Collection Time: 06/24/18  1:05 AM  Result  Value Ref Range Status   Specimen Description BLOOD BLOOD LEFT HAND  Final   Special Requests   Final    BOTTLES DRAWN AEROBIC AND ANAEROBIC Blood Culture results may not be optimal due to an inadequate volume of blood received in culture bottles   Culture   Final    NO GROWTH 1 DAY Performed at [facility], [address] [place], [place] [address]    Report Status PENDING  Incomplete  MRSA PCR Screening     Status: None   Collection Time: 06/24/18  1:16 AM  Result Value Ref Range Status   MRSA by PCR NEGATIVE NEGATIVE Final    Comment:        The GeneXpert MRSA Assay (FDA approved for NASAL specimens only), is one component of a comprehensive MRSA colonization surveillance program. It is not intended to diagnose MRSA infection nor to guide or monitor treatment for MRSA infections. Performed at [facility], [address] [place], [place] [address]     Coagulation Studies: Recent Labs    06/23/18 03/13/2232  LABPROT 13.3  INR 1.02    Imaging: Ct Head Wo Contrast  Result Date: 06/23/2018 CLINICAL DATA:  Acute onset of headache. Confusion. EXAM: CT HEAD WITHOUT CONTRAST TECHNIQUE: Contiguous axial images were obtained from the base of the skull through the vertex without intravenous contrast. COMPARISON:  None. FINDINGS: Brain: No evidence of acute infarction, hemorrhage, hydrocephalus, extra-axial collection or mass lesion/mass effect. Prominence of the ventricles and sulci suggests mild cortical volume loss. Mild periventricular white matter change likely reflects small vessel ischemic microangiopathy. The posterior fossa, including the cerebellum, brainstem and fourth ventricle, is within normal limits. The basal ganglia are unremarkable in appearance. The cerebral hemispheres are symmetric in appearance, with normal gray-white differentiation. No mass effect or midline shift is seen. Vascular: No hyperdense vessel or unexpected calcification. Skull: There is  no evidence of fracture; visualized osseous structures are unremarkable in appearance. Sinuses/Orbits: The visualized portions of the orbits are within normal limits. The paranasal sinuses and mastoid air cells are well-aerated. Other: No significant soft tissue abnormalities are seen. IMPRESSION: 1. No acute intracranial pathology seen on CT. 2. Mild cortical volume loss and scattered small vessel ischemic microangiopathy. Electronically Signed   By: Garald Balding M.D.   On: 06/23/2018 23:02   Mr Brain Wo Contrast  Result Date: 06/24/2018 CLINICAL DATA:  Intubated patient. Previous presentation for severe headache and altered mental status. EXAM: MRI HEAD WITHOUT CONTRAST TECHNIQUE: Multiplanar, multiecho pulse sequences of the brain and surrounding structures were obtained without intravenous contrast.  COMPARISON:  Head CT 06/23/2018 FINDINGS: Brain: Diffusion imaging does not show any acute or subacute infarction. There are mild chronic appearing small vessel changes of the pons. No focal cerebellar insult. Cerebral hemispheres show minimal small vessel change of the white matter. No cortical or large vessel territory infarction. No mass lesion, hemorrhage, hydrocephalus or extra-axial collection. Vascular: Major vessels at the base of the brain show flow. Skull and upper cervical spine: Negative Sinuses/Orbits: Mild inflammatory changes of the paranasal sinuses, possibly subclinical. Orbits negative. Other: None IMPRESSION: Negative study. Minimal small vessel change of the pons and cerebral hemispheric white matter, less than often seen at this age. Electronically Signed   By: Nelson Chimes M.D.   On: 06/24/2018 13:14   Dg Chest Portable 1 View  Result Date: 06/23/2018 CLINICAL DATA:  72 y/o  M; ET tube and OG tube placement. EXAM: PORTABLE CHEST 1 VIEW COMPARISON:  None. FINDINGS: Endotracheal tube tip projects 2.8 cm above the carina. Enteric tube tip extends below the field of view into the abdomen.  Normal cardiac silhouette given projection and technique. Calcific aortic atherosclerosis. Reticular opacities of the lungs. No consolidation. No pleural effusion or pneumothorax identified. Bones are unremarkable. IMPRESSION: 1. Endotracheal tube tip projects 2.8 cm above the carina. 2. Enteric tube tip extends below field of view into the abdomen. 3. Reticular opacities of the lungs may represent atypical pneumonia or interstitial edema. Electronically Signed   By: Kristine Garbe M.D.   On: 06/23/2018 23:13   [place] Ekg Site Rite  Result Date: 06/24/2018 If Site Rite image not attached, placement could not be confirmed due to current cardiac rhythm.   Medications:  I have reviewed the patient's current medications. Scheduled: . aspirin EC  81 mg Oral Daily  . citalopram  10 mg Oral Daily  . doxycycline  100 mg Oral Q12H  . famotidine  20 mg Oral BID  . heparin  5,000 Units Subcutaneous Q8H  . metoprolol succinate  25 mg Oral Daily  . senna-docusate  1 tablet Oral BID  . simvastatin  40 mg Oral q1800    Assessment/Plan: Patient extubated and improved but with some confusion.  No longer febrile on antibiotics.  With no known source would continue antibiotics for full course.  If worsening noted would consider LP at that time.   EEG pending.     LOS: 2 days   Alexis Goodell, MD Neurology [phone] 06/25/2018  1:44 PM

## 2018-06-25 NOTE — Progress Notes (Signed)
   06/25/18 1005  Clinical Encounter Type  Visited With Patient and family together  Visit Type Initial;Spiritual support  Referral From Nurse  Consult/Referral To Chaplain  Spiritual Encounters  Spiritual Needs Prayer;Other (Comment)   [place] received a page from the ICU to come and finish a AD for Mr. Luis Strong. Before visiting the patient I looked at the patient's chart and read that he was experience what was called a "Altered Mental State" when he was admitted yesterday. I visited the patient to see if this status had changed since he was requesting an AD. Upon entering the room I found the patient alert and his wife, Luis Strong< was with him. [name] was still unsure of why I was visiting and he asked if I could visit when he felt better. I agreed that he was not ready for the AD process and asked his wife to have me paged if needed.

## 2018-06-25 NOTE — Progress Notes (Signed)
Pharmacy Electrolyte Monitoring Consult:  Pharmacy consulted to assist in monitoring and replacing electrolytes in this 72 y.o. male admitted on 06/23/2018 with Altered Mental Status and Headache  Patient now extubated. Will treat with Acyclovir x 7 days for possible meningitis.   Labs:  Sodium (mmol/L)  Date Value  06/25/2018 142  10/02/2017 141  11/11/2014 136   Potassium (mmol/L)  Date Value  06/25/2018 3.7  11/11/2014 3.4 (L)   Phosphorus (mg/dL)  Date Value  10/02/2017 3.9   Calcium (mg/dL)  Date Value  06/25/2018 8.1 (L)   Calcium, Total (mg/dL)  Date Value  11/11/2014 8.7   Albumin (g/dL)  Date Value  06/23/2018 4.4  10/02/2017 4.7  04/18/2012 4.2    Assessment/Plan: 1. Electrolytes: Potassium 36mEq PO x 1. Will recheck electrolytes with am labs.   2. Glucose: will continue to monitor.   3. Constipation: Patient extubated. Will continue senna/docusate 1 tab BID; first dose 8/7. Will continue to monitor.   Pharmacy will continue to monitor and adjust per consult.   Simpson,Michael L 06/25/2018 4:44 PM

## 2018-06-26 DIAGNOSIS — F4323 Adjustment disorder with mixed anxiety and depressed mood: Secondary | ICD-10-CM

## 2018-06-26 LAB — ROCKY MTN SPOTTED FVR ABS PNL(IGG+IGM)
RMSF IgG: UNDETERMINED
RMSF IgM: 0.26 index (ref 0.00–0.89)

## 2018-06-26 LAB — BASIC METABOLIC PANEL
Anion gap: 9 (ref 5–15)
BUN: 14 mg/dL (ref 8–23)
CO2: 21 mmol/L — ABNORMAL LOW (ref 22–32)
CREATININE: 1.36 mg/dL — AB (ref 0.61–1.24)
Calcium: 8.5 mg/dL — ABNORMAL LOW (ref 8.9–10.3)
Chloride: 110 mmol/L (ref 98–111)
GFR calc Af Amer: 58 mL/min — ABNORMAL LOW (ref >60)
GFR, EST NON AFRICAN AMERICAN: 50 mL/min — AB (ref >60)
Glucose, Bld: 101 mg/dL — ABNORMAL HIGH (ref 70–99)
Potassium: 3.9 mmol/L (ref 3.5–5.1)
SODIUM: 140 mmol/L (ref 135–145)

## 2018-06-26 LAB — RMSF, IGG, IFA

## 2018-06-26 MED ORDER — TRAZODONE HCL 50 MG PO TABS: 50.0000 mg | ORAL_TABLET | Freq: Every evening | ORAL | 0 refills | Status: DC | PRN

## 2018-06-26 MED ORDER — ACYCLOVIR 200 MG PO CAPS: 400.0000 mg | ORAL_CAPSULE | Freq: Three times a day (TID) | ORAL | 0 refills | Status: AC

## 2018-06-26 MED ORDER — DOXYCYCLINE HYCLATE 100 MG PO TABS: 100.0000 mg | ORAL_TABLET | Freq: Two times a day (BID) | ORAL | 0 refills | Status: AC

## 2018-06-26 MED ORDER — ACYCLOVIR 200 MG PO CAPS
400.0000 mg | ORAL_CAPSULE | Freq: Three times a day (TID) | ORAL | Status: DC
  Administered 2018-06-26: 14:00:00 400 mg via ORAL
  Filled 2018-06-26 (×3): qty 2
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
## 2018-06-26 NOTE — Progress Notes (Signed)
Pharmacy Electrolyte Monitoring Consult:  Pharmacy consulted to assist in monitoring and replacing electrolytes in this 72 y.o. male admitted on 06/23/2018 with Altered Mental Status and Headache  Patient now extubated. Will treat with Acyclovir x 7 days for possible meningitis. And doxycyline for possible RMSF  Labs:  Sodium (mmol/L)  Date Value  06/26/2018 140  10/02/2017 141  11/11/2014 136   Potassium (mmol/L)  Date Value  06/26/2018 3.9  11/11/2014 3.4 (L)   Phosphorus (mg/dL)  Date Value  10/02/2017 3.9   Calcium (mg/dL)  Date Value  06/26/2018 8.5 (L)   Calcium, Total (mg/dL)  Date Value  11/11/2014 8.7   Albumin (g/dL)  Date Value  06/23/2018 4.4  10/02/2017 4.7  04/18/2012 4.2    Assessment/Plan: K=3.9 Last BM possible 8/5- -pt started on appropriate therapy (senna-docusate) yesterday Patient has been transferred to the floor. Pharmacy will sign off consult as no longer in ICU, but will follow from [place], [place].D, BCPS Clinical Pharmacist 06/26/2018 7:13 AM

## 2018-06-26 NOTE — Evaluation (Signed)
Physical Therapy Evaluation Patient Details Name: Luis Strong MRN: [id number] DOB: [birth year] Today's Date: 06/26/2018   History of Present Illness  Patient 72 yo male that presented to ED for acute AMS and complaints of headache, N/V. PMH of HTN, skin cancer, HLD, depression, GERD, mitral incompetence, intubated 06/24/18 due to severe delirium and agitation, extubated 06/24/18. Work up for possible menigitis.  Clinical Impression  Patient A&Ox3 at start of sessions with no complaints. Patient reports living in 1 story home with wife and children and being independent with mobility, ADLs, and IADLs. The patient demonstrated bed mobility and transfers mod I, and ambulated ~160ft with no AD and CGA/supervision. Higher level balance/dynamic balance assessed, patient demonstrates decreased righting reactions/hip reactions and is unable to maintain SLS b/l. The patient would benefit from further skilled PT to address these balance deficits and decrease risk of falls.     Follow Up Recommendations Outpatient PT    Equipment Recommendations  None recommended by PT    Recommendations for Other Services       Precautions / Restrictions Precautions Precautions: Fall Restrictions Weight Bearing Restrictions: No      Mobility  Bed Mobility Overal bed mobility: Modified Independent                Transfers Overall transfer level: Modified independent Equipment used: None                Ambulation/Gait Ambulation/Gait assistance: Supervision;Min guard Gait Distance (Feet): 180 Feet Assistive device: None Gait Pattern/deviations: WFL(Within Functional Limits)        Stairs            Wheelchair Mobility    Modified Rankin (Stroke Patients Only)       Balance Overall balance assessment: Needs assistance   Sitting balance-Leahy Scale: Normal       Standing balance-Leahy Scale: Good   Single Leg Stance - Right Leg: 4 Single Leg Stance - Left Leg:  5 Tandem Stance - Right Leg: 15   Rhomberg - Eyes Opened: 20 Rhomberg - Eyes Closed: 15 High level balance activites: Turns;Sudden stops;Head turns               Pertinent Vitals/Pain Pain Assessment: No/denies pain    Home Living Family/patient expects to be discharged to:: Private residence Living Arrangements: Spouse/significant other;Children Available Help at Discharge: Family Type of Home: House Home Access: Stairs to enter Entrance Stairs-Rails: None Technical brewer of Steps: 1 Home Layout: One level Home Equipment: Cane - single point(Patient's wife has cane)      Prior Function Level of Independence: Independent               Hand Dominance   Dominant Hand: Right    Extremity/Trunk Assessment   Upper Extremity Assessment Upper Extremity Assessment: RUE deficits/detail;LUE deficits/detail RUE Deficits / Details: grossly 4/5 LUE Deficits / Details: grossly 4/5    Lower Extremity Assessment Lower Extremity Assessment: RLE deficits/detail;LLE deficits/detail RLE Deficits / Details: 4/5 LLE Deficits / Details: 4/5       Communication   Communication: No difficulties  Cognition Arousal/Alertness: Awake/alert Behavior During Therapy: WFL for tasks assessed/performed Overall Cognitive Status: Within Functional Limits for tasks assessed                                        General Comments      Exercises     Assessment/Plan  PT Assessment Patient needs continued PT services  PT Problem List Decreased strength;Decreased safety awareness;Decreased balance       PT Treatment Interventions DME instruction;Balance training;Gait training;Neuromuscular re-education;Stair training;Functional mobility training;Patient/family education;Therapeutic activities;Therapeutic exercise    PT Goals (Current goals can be found in the Care Plan section)  Acute Rehab PT Goals Patient Stated Goal: Patient wants to go home PT Goal  Formulation: With patient Time For Goal Achievement: 07/10/18 Potential to Achieve Goals: Good Additional Goals Additional Goal #1: Patient will demonstrate single leg stance b/l for at least 10seconds to decrease risk of falls. Additional Goal #2: Patient will demonstrate high level dynamic balance activities with supervision to improve balance/decrease risk of falls.    Frequency Min 2X/week   Barriers to discharge        Co-evaluation               AM-PAC PT "6 Clicks" Daily Activity  Outcome Measure Difficulty turning over in bed (including adjusting bedclothes, sheets and blankets)?: None Difficulty moving from lying on back to sitting on the side of the bed? : None Difficulty sitting down on and standing up from a chair with arms (e.g., wheelchair, bedside commode, etc,.)?: None Help needed moving to and from a bed to chair (including a wheelchair)?: None Help needed walking in hospital room?: A Little Help needed climbing 3-5 steps with a railing? : A Little 6 Click Score: 22    End of Session Equipment Utilized During Treatment: Gait belt Activity Tolerance: Patient tolerated treatment well Patient left: in chair;with chair alarm set;with call bell/phone within reach Nurse Communication: Mobility status PT Visit Diagnosis: Unsteadiness on feet (R26.81);Muscle weakness (generalized) (M62.81);Other abnormalities of gait and mobility (R26.89)    Time: 3338-3291 PT Time Calculation (min) (ACUTE ONLY): 19 min   Charges:   PT Evaluation $PT Eval Low Complexity: 1 Low         Lieutenant Diego PT, DPT 12:12 PM,06/26/18 [phone]

## 2018-06-26 NOTE — Progress Notes (Signed)
Patient discharged home with family, discharge education given to patient/family. All questions answered and concerns addressed. Patient/family verbalized understanding.

## 2018-06-26 NOTE — Plan of Care (Signed)

## 2018-06-26 NOTE — Discharge Summary (Signed)
[facility] [place] NAME: Luis Strong    MR#:  [id number]  DATE OF BIRTH:  Nov 22, 1945  DATE OF ADMISSION:  06/23/2018 ADMITTING PHYSICIAN: Amelia Jo, MD  DATE OF DISCHARGE: 06/26/2018  PRIMARY CARE PHYSICIAN: Juline Patch, MD    ADMISSION DIAGNOSIS:  Delirium [R41.0] Elevated lactic acid level [R79.89] Severe sepsis ([place]) [A41.9, R65.20] Community acquired pneumonia, unspecified laterality [J18.9]  DISCHARGE DIAGNOSIS:  Principal Problem:   Adjustment disorder with mixed anxiety and depressed mood Active Problems:   Sepsis ([place])   SECONDARY DIAGNOSIS:   Past Medical History:  Diagnosis Date  . Abnormal cardiovascular stress test 11/15/2015  . Arteriosclerosis of coronary artery 11/16/2014   Overview:  Sp pci stetn of lad 2015   . Benign essential HTN 11/16/2014  . Cancer ([place]) 2001   skin cancer on face  . Chest pain 11/03/2014  . Combined fat and carbohydrate induced hyperlipemia 11/15/2015  . Complication of anesthesia 10/2014   severe head ache with cardiac stent placement. Refered to neurologist.  . Depression   . GERD (gastroesophageal reflux disease)   . Hyperlipidemia   . Hypertension   . Kidney stones    x 20 years  . MI (mitral incompetence) 04/20/2014    HOSPITAL COURSE:   1.  Clinical sepsis.  Possibly from pneumonia or viral encephalitis.  Patient was empirically placed on acyclovir and doxycycline.  Patient was very agitated when he came into the hospital and they were unable to get a lumbar puncture.  When he was extubated he deferred lumbar puncture.  Tickborne studies were ordered but not back yet.  Finish a week's worth of acyclovir and 10 days worth of doxycycline. 2.  Acute encephalopathy.  Likely from sepsis.  This has improved. 3.  Acute hypoxic respiratory failure.  Extubated 06/24/2018 and breathing comfortably on room air. 4.  Acute kidney injury on chronic kidney disease stage III.  Creatinine  had improved during the hospital course from 1.7 down to 1.36. 5.  Depression and insomnia.  Celexa restarted.  Trazodone as needed for sleep. 6.  Hyperlipidemia unspecified on Zocor 7.  Hypertension on Toprol 8.  Physical therapy recommended outpatient physical therapy for gait training  DISCHARGE CONDITIONS:   Satisfactory  CONSULTS OBTAINED:  Treatment Team:  Catarina Hartshorn, MD  DRUG ALLERGIES:   Allergies  Allergen Reactions  . Capsaicin Itching and Rash    Severe rash and itching.    DISCHARGE MEDICATIONS:   Allergies as of 06/26/2018      Reactions   Capsaicin Itching, Rash   Severe rash and itching.      Medication List    TAKE these medications   acyclovir 200 MG capsule Commonly known as:  ZOVIRAX Take 2 capsules (400 mg total) by mouth 3 (three) times daily for 5 days.   aspirin 81 MG chewable tablet Chew 1 tablet by mouth daily. In am   citalopram 10 MG tablet Commonly known as:  CELEXA TAKE 1 TABLET DAILY (NOT SEEN IN A YEAR, SCHEDULE APPOINTMENT, LAST TIME FILLING)   CoQ10 100 MG Caps Take by mouth daily.   doxycycline 100 MG tablet Commonly known as:  VIBRA-TABS Take 1 tablet (100 mg total) by mouth every 12 (twelve) hours for 7 days.   Flaxseed Oil 1200 MG Caps Take by mouth daily.   metoprolol succinate 25 MG 24 hr tablet Commonly known as:  TOPROL-XL TAKE 1 TABLET AT BEDTIME   OMEGA-3 FISH OIL PO  Take 1,040 mg by mouth daily.   PLANT STANOL ESTER PO Take 900 mg by mouth daily.   Resveratrol 250 MG Caps Take by mouth daily.   simvastatin 40 MG tablet Commonly known as:  ZOCOR TAKE 1 TABLET DAILY   traZODone 50 MG tablet Commonly known as:  DESYREL Take 1 tablet (50 mg total) by mouth at bedtime as needed for sleep.   Turmeric 500 MG Tabs Take by mouth daily.        DISCHARGE INSTRUCTIONS:   Follow-up PMD 5 to 7 days  If you experience worsening of your admission symptoms, develop shortness of breath, life  threatening emergency, suicidal or homicidal thoughts you must seek medical attention immediately by calling 911 or calling your MD immediately  if symptoms less severe.  You Must read complete instructions/literature along with all the possible adverse reactions/side effects for all the Medicines you take and that have been prescribed to you. Take any new Medicines after you have completely understood and accept all the possible adverse reactions/side effects.   Please note  You were cared for by a hospitalist during your hospital stay. If you have any questions about your discharge medications or the care you received while you were in the hospital after you are discharged, you can call the unit and asked to speak with the hospitalist on call if the hospitalist that took care of you is not available. Once you are discharged, your primary care physician will handle any further medical issues. Please note that NO REFILLS for any discharge medications will be authorized once you are discharged, as it is imperative that you return to your primary care physician (or establish a relationship with a primary care physician if you do not have one) for your aftercare needs so that they can reassess your need for medications and monitor your lab values.    Today   CHIEF COMPLAINT:   Chief Complaint  Patient presents with  . Altered Mental Status  . Headache    HISTORY OF PRESENT ILLNESS:  Luis Strong  is a 72 y.o. male came in with altered mental status and headache   VITAL SIGNS:  Blood pressure (!) 158/77, pulse 85, temperature 98.3 F (36.8 C), temperature source Oral, resp. rate 16, height 5\' 11"  (1.803 m), weight 94.3 kg, SpO2 95 %.    PHYSICAL EXAMINATION:  GENERAL:  72 y.o.-year-old patient lying in the bed with no acute distress.  EYES: Pupils equal, round, reactive to light and accommodation. No scleral icterus. Extraocular muscles intact.  HEENT: Head atraumatic, normocephalic.  Oropharynx and nasopharynx clear.  NECK:  Supple, no jugular venous distention. No thyroid enlargement, no tenderness.  LUNGS: Normal breath sounds bilaterally, no wheezing, rales,rhonchi or crepitation. No use of accessory muscles of respiration.  CARDIOVASCULAR: S1, S2 normal. No murmurs, rubs, or gallops.  ABDOMEN: Soft, non-tender, non-distended. Bowel sounds present. No organomegaly or mass.  EXTREMITIES: No pedal edema, cyanosis, or clubbing.  NEUROLOGIC: Cranial nerves II through XII are intact. Muscle strength 5/5 in all extremities. Sensation intact. Gait wide-based.  Romberg negative PSYCHIATRIC: The patient is alert and oriented x 3.  SKIN: No obvious rash, lesion, or ulcer.   DATA REVIEW:   CBC Recent Labs  Lab 06/24/18 0105  WBC 13.4*  HGB 14.3  HCT 44.6  PLT 141*    Chemistries  Recent Labs  Lab 06/23/18 2233  06/26/18 0510  NA 140   < > 140  K 3.9   < > 3.9  CL 109   < > 110  CO2 18*   < > 21*  GLUCOSE 156*   < > 101*  BUN 16   < > 14  CREATININE 1.70*   < > 1.36*  CALCIUM 9.3   < > 8.5*  AST 52*  --   --   ALT 32  --   --   ALKPHOS 54  --   --   BILITOT 1.5*  --   --    < > = values in this interval not displayed.    Cardiac Enzymes Recent Labs  Lab 06/23/18 2233  TROPONINI <0.03    Microbiology Results  Results for orders placed or performed during the hospital encounter of 06/23/18  Urine culture     Status: None   Collection Time: 06/23/18 11:07 PM  Result Value Ref Range Status   Specimen Description   Final    URINE, CATHETERIZED Performed at [facility], [address] [place], [place] [address]    Special Requests   Final    NONE Performed at [facility], [address] [place], [place] [address]    Culture   Final    NO GROWTH Performed at [facility], [place] [address] [place], [place] [address]    Report Status 06/25/2018 FINAL  Final  Blood Culture (routine x 2)     Status: None (Preliminary  result)   Collection Time: 06/23/18 11:59 PM  Result Value Ref Range Status   Specimen Description BLOOD LEFT FATTY CASTS  Final   Special Requests   Final    BOTTLES DRAWN AEROBIC AND ANAEROBIC Blood Culture adequate volume   Culture   Final    NO GROWTH 2 DAYS Performed at [facility], [address] [place], [place] [address]    Report Status PENDING  Incomplete  Blood Culture (routine x 2)     Status: None (Preliminary result)   Collection Time: 06/24/18  1:05 AM  Result Value Ref Range Status   Specimen Description BLOOD BLOOD LEFT HAND  Final   Special Requests   Final    BOTTLES DRAWN AEROBIC AND ANAEROBIC Blood Culture results may not be optimal due to an inadequate volume of blood received in culture bottles   Culture   Final    NO GROWTH 2 DAYS Performed at [facility], [address] [place], [place] [address]    Report Status PENDING  Incomplete  MRSA PCR Screening     Status: None   Collection Time: 06/24/18  1:16 AM  Result Value Ref Range Status   MRSA by PCR NEGATIVE NEGATIVE Final    Comment:        The GeneXpert MRSA Assay (FDA approved for NASAL specimens only), is one component of a comprehensive MRSA colonization surveillance program. It is not intended to diagnose MRSA infection nor to guide or monitor treatment for MRSA infections. Performed at [facility], [address], [place], [place] [address]       Management plans discussed with the patient, family and they are in agreement.  CODE STATUS:     Code Status Orders  (From admission, onward)         Start     Ordered   06/24/18 0118  Full code  Continuous     06/24/18 0117        Code Status History    This patient has a current code status but no historical  code status.    Advance Directive Documentation     Most Recent Value  Type of Advance Directive  Healthcare Power of Attorney, Living will  Pre-existing out of facility DNR order (yellow  form or pink MOST form)  -  "MOST" Form in Place?  -      TOTAL TIME TAKING CARE OF THIS PATIENT: 35 minutes.    Loletha Grayer M.D on 06/26/2018 at 2:11 PM  Between 7am to 6pm - Pager - [phone]  After 6pm go to [email or web address] - password [email or web address]  [employer] Office  [phone]  CC: Primary care physician; Juline Patch, MD

## 2018-06-26 NOTE — Care Management (Signed)
Physical therapy evaluation completed. Recommending outpatient therapy. Referral signed per Dr. Leslye Peer and faxed to [place] RN MSN [facility] [phone]

## 2018-06-26 NOTE — Progress Notes (Signed)
Subjective: Patient continues to improve.    Objective: Current vital signs: BP (!) 158/77 (BP Location: Left Arm)   Pulse 85   Temp 98.3 F (36.8 C) (Oral)   Resp 16   Ht 5\' 11"  (1.803 m)   Wt 94.3 kg   SpO2 95%   BMI 28.98 kg/m  Vital signs in last 24 hours: Temp:  [98 F (36.7 C)-98.6 F (37 C)] 98.3 F (36.8 C) (08/08 0946) Pulse Rate:  [83-102] 85 (08/08 0946) Resp:  [14-23] 16 (08/08 0946) BP: (141-159)/(70-86) 158/77 (08/08 0946) SpO2:  [93 %-97 %] 95 % (08/08 0946) Weight:  [94.3 kg] 94.3 kg (08/08 0454)  Intake/Output from previous day: 08/07 0701 - 08/08 0700 In: 2367.5 [P.O.:120; I.V.:1229.8; IV Piggyback:1017.7] Out: 725 [Urine:725] Intake/Output this shift: Total I/O In: 360 [P.O.:360] Out: -  Nutritional status:  Diet Order            Diet 2 gram sodium Room service appropriate? Yes; Fluid consistency: Thin  Diet effective now              Neurologic Exam: Mental Status: Alert, oriented to name and place but still requires some mild reinforcement for 3-step commands. Speech fluent without evidence of aphasia.  Cranial Nerves: II: Discs flat bilaterally; Visual fields grossly normal, pupils equal, round, reactive to light and accommodation III,IV, VI: ptosis not present, extra-ocular motions intact bilaterally V,VII: smile symmetric, facial light touch sensation normal bilaterally VIII: hearing normal bilaterally IX,X: gag reflex present XI: bilateral shoulder shrug XII: midline tongue extension Motor: 5/5 throughout  Lab Results: Basic Metabolic Panel: Recent Labs  Lab 06/23/18 2233 06/24/18 0105 06/25/18 0537 06/26/18 0510  NA 140 143 142 140  K 3.9 4.7 3.7 3.9  CL 109 113* 115* 110  CO2 18* 20* 20* 21*  GLUCOSE 156* 133* 95 101*  BUN 16 16 18 14   CREATININE 1.70* 1.53* 1.56* 1.36*  CALCIUM 9.3 8.4* 8.1* 8.5*    Liver Function Tests: Recent Labs  Lab 06/23/18 2233  AST 52*  ALT 32  ALKPHOS 54  BILITOT 1.5*  PROT 7.6   ALBUMIN 4.4   Recent Labs  Lab 06/23/18 2233  LIPASE 24   No results for input(s): AMMONIA in the last 168 hours.  CBC: Recent Labs  Lab 06/23/18 2233 06/24/18 0105  WBC 13.5* 13.4*  NEUTROABS 11.8*  --   HGB 14.7 14.3  HCT 44.7 44.6  MCV 76.2* 78.4*  PLT 177 141*    Cardiac Enzymes: Recent Labs  Lab 06/23/18 2233  TROPONINI <0.03    Lipid Panel: Recent Labs  Lab 06/24/18 0105  TRIG 248*    CBG: Recent Labs  Lab 06/24/18 0104 06/25/18 0716  GLUCAP 124* 32    Microbiology: Results for orders placed or performed during the hospital encounter of 06/23/18  Urine culture     Status: None   Collection Time: 06/23/18 11:07 PM  Result Value Ref Range Status   Specimen Description   Final    URINE, CATHETERIZED Performed at [facility], [address], [place], [place] [address]    Special Requests   Final    NONE Performed at [facility], [address] [place], [place] [address]    Culture   Final    NO GROWTH Performed at [facility], [place] [address], [place], [place] [address]    Report Status 06/25/2018 FINAL  Final  Blood Culture (routine x 2)     Status: None (  Preliminary result)   Collection Time: 06/23/18 11:59 PM  Result Value Ref Range Status   Specimen Description BLOOD LEFT FATTY CASTS  Final   Special Requests   Final    BOTTLES DRAWN AEROBIC AND ANAEROBIC Blood Culture adequate volume   Culture   Final    NO GROWTH 2 DAYS Performed at [facility], [address] [place], [place] [address]    Report Status PENDING  Incomplete  Blood Culture (routine x 2)     Status: None (Preliminary result)   Collection Time: 06/24/18  1:05 AM  Result Value Ref Range Status   Specimen Description BLOOD BLOOD LEFT HAND  Final   Special Requests   Final    BOTTLES DRAWN AEROBIC AND ANAEROBIC Blood Culture results may not be optimal due to an inadequate volume of blood received in culture bottles   Culture    Final    NO GROWTH 2 DAYS Performed at [facility], [address] [place], [place] [address]    Report Status PENDING  Incomplete  MRSA PCR Screening     Status: None   Collection Time: 06/24/18  1:16 AM  Result Value Ref Range Status   MRSA by PCR NEGATIVE NEGATIVE Final    Comment:        The GeneXpert MRSA Assay (FDA approved for NASAL specimens only), is one component of a comprehensive MRSA colonization surveillance program. It is not intended to diagnose MRSA infection nor to guide or monitor treatment for MRSA infections. Performed at [facility], [place] [place], [place] [address]     Coagulation Studies: Recent Labs    06/23/18 29-Feb-2232  LABPROT 13.3  INR 1.02    Imaging: No results found.  Medications:  I have reviewed the patient's current medications. Scheduled: . acyclovir  400 mg Oral TID  . aspirin EC  81 mg Oral Daily  . citalopram  10 mg Oral Daily  . doxycycline  100 mg Oral Q12H  . enoxaparin (LOVENOX) injection  40 mg Subcutaneous Q24H  . famotidine  20 mg Oral BID  . metoprolol succinate  25 mg Oral Daily  . senna-docusate  1 tablet Oral BID  . simvastatin  40 mg Oral q1800  . traZODone  50 mg Oral QHS    Assessment/Plan: Patient improved but with some residual mild confusion as of today.  Expect this will continue to improve.    No further neurologic intervention is recommended at this time.  If further questions arise, please call or page at that time.  Thank you for allowing neurology to participate in the care of this patient.   LOS: 3 days   Alexis Goodell, MD Neurology [phone] 06/26/2018  2:08 PM

## 2018-06-26 NOTE — Care Management Important Message (Signed)
Important Message  Patient Details  Name: Luis Strong MRN: [id number] Date of Birth: [birth year]   Medicare Important Message Given:  Yes    Juliann Pulse A Waller Marcussen 06/26/2018, 11:33 AM

## 2018-06-27 ENCOUNTER — Telehealth: Payer: Self-pay

## 2018-06-27 ENCOUNTER — Encounter: Payer: Self-pay | Admitting: Family Medicine

## 2018-06-27 ENCOUNTER — Other Ambulatory Visit: Payer: Self-pay | Admitting: Family Medicine

## 2018-06-27 DIAGNOSIS — I10 Essential (primary) hypertension: Secondary | ICD-10-CM

## 2018-06-27 NOTE — Telephone Encounter (Signed)
TOC #1. Called pt to f/u after d/c from [facility] on 06/26/18. Also wanted to confirm his hosp f/u appt w/ Dr. Ronnald Ramp on 07/04/18 @ 9:30am. Discharge planning includes the following:  - Begin doxy, zovirax and trazodone - f/u with Dr. Ronnald Ramp in 1 week LVM requesting returned call.

## 2018-06-29 LAB — CULTURE, BLOOD (ROUTINE X 2)
CULTURE: NO GROWTH
Culture: NO GROWTH
SPECIAL REQUESTS: ADEQUATE

## 2018-06-30 ENCOUNTER — Telehealth: Payer: Self-pay

## 2018-06-30 NOTE — Telephone Encounter (Signed)
TOC #2. Called pt to f/u after d/c from [facility] on 06/26/18. Also wanted to confirm his hosp f/u appt w/ Dr. Ronnald Ramp on 07/04/18 @ 9:30am. Discharge planning includes the following:  - Begin doxy, zovirax and trazodone - f/u with Dr. Ronnald Ramp in 1 week Unable to LVM d/t no answer.

## 2018-07-04 ENCOUNTER — Ambulatory Visit (INDEPENDENT_AMBULATORY_CARE_PROVIDER_SITE_OTHER): Payer: Medicare Other | Admitting: Family Medicine

## 2018-07-04 ENCOUNTER — Encounter: Payer: Self-pay | Admitting: Family Medicine

## 2018-07-04 VITALS — BP 122/68 | HR 68 | Ht 71.0 in | Wt 200.8 lb

## 2018-07-04 DIAGNOSIS — A419 Sepsis, unspecified organism: Secondary | ICD-10-CM | POA: Diagnosis not present

## 2018-07-04 DIAGNOSIS — B356 Tinea cruris: Secondary | ICD-10-CM

## 2018-07-04 DIAGNOSIS — N183 Chronic kidney disease, stage 3 unspecified: Secondary | ICD-10-CM

## 2018-07-04 DIAGNOSIS — Z09 Encounter for follow-up examination after completed treatment for conditions other than malignant neoplasm: Secondary | ICD-10-CM

## 2018-07-04 DIAGNOSIS — K120 Recurrent oral aphthae: Secondary | ICD-10-CM | POA: Diagnosis not present

## 2018-07-04 LAB — POCT URINALYSIS DIPSTICK
Bilirubin, UA: NEGATIVE
Blood, UA: NEGATIVE
Glucose, UA: NEGATIVE
Ketones, UA: NEGATIVE
Leukocytes, UA: NEGATIVE
Nitrite, UA: NEGATIVE
Protein, UA: NEGATIVE
Spec Grav, UA: 1.015 (ref 1.010–1.025)
Urobilinogen, UA: 0.2 E.U./dL
pH, UA: 5 (ref 5.0–8.0)

## 2018-07-04 MED ORDER — CLOTRIMAZOLE 1 % EX CREA: 1.0000 "application " | TOPICAL_CREAM | Freq: Two times a day (BID) | CUTANEOUS | 0 refills | Status: DC

## 2018-07-04 MED ORDER — ACYCLOVIR 200 MG PO CAPS: 200.0000 mg | ORAL_CAPSULE | Freq: Two times a day (BID) | ORAL | 0 refills | Status: DC

## 2018-07-04 MED ORDER — CITALOPRAM HYDROBROMIDE 20 MG PO TABS: 20.0000 mg | ORAL_TABLET | Freq: Every day | ORAL | 1 refills | Status: DC
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
## 2018-07-05 LAB — RENAL FUNCTION PANEL
Albumin: 4.9 g/dL — ABNORMAL HIGH (ref 3.5–4.8)
BUN / CREAT RATIO: 10 (ref 10–24)
BUN: 17 mg/dL (ref 8–27)
CALCIUM: 10 mg/dL (ref 8.6–10.2)
CHLORIDE: 105 mmol/L (ref 96–106)
CO2: 13 mmol/L — AB (ref 20–29)
Creatinine, Ser: 1.64 mg/dL — ABNORMAL HIGH (ref 0.76–1.27)
GFR calc Af Amer: 48 mL/min/{1.73_m2} — ABNORMAL LOW (ref >59)
GFR calc non Af Amer: 41 mL/min/{1.73_m2} — ABNORMAL LOW (ref >59)
Glucose: 61 mg/dL — ABNORMAL LOW (ref 65–99)
PHOSPHORUS: 3.1 mg/dL (ref 2.5–4.5)
Potassium: 5.5 mmol/L — ABNORMAL HIGH (ref 3.5–5.2)
Sodium: 141 mmol/L (ref 134–144)

## 2018-07-05 LAB — CBC WITH DIFFERENTIAL/PLATELET
Basophils Absolute: 0.1 10*3/uL (ref 0.0–0.2)
Basos: 1 %
EOS (ABSOLUTE): 0.4 10*3/uL (ref 0.0–0.4)
EOS: 5 %
HEMATOCRIT: 47.2 % (ref 37.5–51.0)
Hemoglobin: 15.1 g/dL (ref 13.0–17.7)
IMMATURE GRANS (ABS): 0.1 10*3/uL (ref 0.0–0.1)
IMMATURE GRANULOCYTES: 2 %
Lymphocytes Absolute: 2.1 10*3/uL (ref 0.7–3.1)
Lymphs: 24 %
MCH: 25.3 pg — AB (ref 26.6–33.0)
MCHC: 32 g/dL (ref 31.5–35.7)
MCV: 79 fL (ref 79–97)
MONOS ABS: 0.7 10*3/uL (ref 0.1–0.9)
Monocytes: 7 %
NEUTROS ABS: 5.4 10*3/uL (ref 1.4–7.0)
NEUTROS PCT: 61 %
PLATELETS: 237 10*3/uL (ref 150–450)
RBC: 5.97 x10E6/uL — ABNORMAL HIGH (ref 4.14–5.80)
RDW: 18.8 % — AB (ref 12.3–15.4)
WBC: 8.8 10*3/uL (ref 3.4–10.8)

## 2018-07-22 ENCOUNTER — Other Ambulatory Visit: Payer: Medicare Other

## 2018-07-22 DIAGNOSIS — R7989 Other specified abnormal findings of blood chemistry: Secondary | ICD-10-CM

## 2018-07-23 LAB — RENAL FUNCTION PANEL
Albumin: 4.2 g/dL (ref 3.5–4.8)
BUN/Creatinine Ratio: 8 — ABNORMAL LOW (ref 10–24)
BUN: 11 mg/dL (ref 8–27)
CALCIUM: 9.1 mg/dL (ref 8.6–10.2)
CHLORIDE: 105 mmol/L (ref 96–106)
CO2: 18 mmol/L — ABNORMAL LOW (ref 20–29)
Creatinine, Ser: 1.41 mg/dL — ABNORMAL HIGH (ref 0.76–1.27)
GFR calc Af Amer: 57 mL/min/{1.73_m2} — ABNORMAL LOW (ref >59)
GFR calc non Af Amer: 49 mL/min/{1.73_m2} — ABNORMAL LOW (ref >59)
GLUCOSE: 85 mg/dL (ref 65–99)
PHOSPHORUS: 4 mg/dL (ref 2.5–4.5)
Potassium: 4.4 mmol/L (ref 3.5–5.2)
Sodium: 140 mmol/L (ref 134–144)

## 2018-08-12 DIAGNOSIS — E782 Mixed hyperlipidemia: Secondary | ICD-10-CM | POA: Diagnosis not present

## 2018-08-12 DIAGNOSIS — I10 Essential (primary) hypertension: Secondary | ICD-10-CM | POA: Diagnosis not present

## 2018-08-12 DIAGNOSIS — I340 Nonrheumatic mitral (valve) insufficiency: Secondary | ICD-10-CM | POA: Diagnosis not present

## 2018-08-12 DIAGNOSIS — N183 Chronic kidney disease, stage 3 (moderate): Secondary | ICD-10-CM | POA: Diagnosis not present

## 2018-08-12 DIAGNOSIS — I2510 Atherosclerotic heart disease of native coronary artery without angina pectoris: Secondary | ICD-10-CM | POA: Diagnosis not present

## 2018-08-13 ENCOUNTER — Other Ambulatory Visit: Payer: Self-pay | Admitting: Family Medicine

## 2018-08-13 DIAGNOSIS — E785 Hyperlipidemia, unspecified: Secondary | ICD-10-CM

## 2018-10-01 DIAGNOSIS — L821 Other seborrheic keratosis: Secondary | ICD-10-CM | POA: Diagnosis not present

## 2018-11-19 DIAGNOSIS — I639 Cerebral infarction, unspecified: Secondary | ICD-10-CM

## 2018-11-19 HISTORY — DX: Cerebral infarction, unspecified: I63.9

## 2018-12-02 ENCOUNTER — Other Ambulatory Visit: Payer: Self-pay | Admitting: Family Medicine

## 2018-12-02 DIAGNOSIS — I10 Essential (primary) hypertension: Secondary | ICD-10-CM

## 2018-12-24 ENCOUNTER — Ambulatory Visit (INDEPENDENT_AMBULATORY_CARE_PROVIDER_SITE_OTHER): Payer: Medicare Other | Admitting: Family Medicine

## 2018-12-24 ENCOUNTER — Encounter: Payer: Self-pay | Admitting: Family Medicine

## 2018-12-24 VITALS — BP 132/81 | HR 84 | Resp 16 | Ht 71.0 in | Wt 202.0 lb

## 2018-12-24 DIAGNOSIS — G459 Transient cerebral ischemic attack, unspecified: Secondary | ICD-10-CM

## 2018-12-24 DIAGNOSIS — I10 Essential (primary) hypertension: Secondary | ICD-10-CM | POA: Diagnosis not present

## 2018-12-24 DIAGNOSIS — E782 Mixed hyperlipidemia: Secondary | ICD-10-CM

## 2018-12-24 DIAGNOSIS — I2510 Atherosclerotic heart disease of native coronary artery without angina pectoris: Secondary | ICD-10-CM | POA: Diagnosis not present
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
## 2018-12-25 DIAGNOSIS — R531 Weakness: Secondary | ICD-10-CM | POA: Diagnosis not present

## 2018-12-25 DIAGNOSIS — R200 Anesthesia of skin: Secondary | ICD-10-CM | POA: Diagnosis not present

## 2018-12-30 DIAGNOSIS — R531 Weakness: Secondary | ICD-10-CM | POA: Diagnosis not present

## 2018-12-30 DIAGNOSIS — I6523 Occlusion and stenosis of bilateral carotid arteries: Secondary | ICD-10-CM | POA: Diagnosis not present

## 2018-12-30 DIAGNOSIS — R200 Anesthesia of skin: Secondary | ICD-10-CM | POA: Diagnosis not present

## 2019-01-05 DIAGNOSIS — R200 Anesthesia of skin: Secondary | ICD-10-CM | POA: Insufficient documentation

## 2019-01-05 DIAGNOSIS — R531 Weakness: Secondary | ICD-10-CM | POA: Insufficient documentation

## 2019-01-26 DIAGNOSIS — G459 Transient cerebral ischemic attack, unspecified: Secondary | ICD-10-CM | POA: Diagnosis not present

## 2019-01-26 DIAGNOSIS — I6381 Other cerebral infarction due to occlusion or stenosis of small artery: Secondary | ICD-10-CM | POA: Diagnosis not present

## 2019-01-26 DIAGNOSIS — N183 Chronic kidney disease, stage 3 (moderate): Secondary | ICD-10-CM | POA: Diagnosis not present

## 2019-01-26 DIAGNOSIS — Z7982 Long term (current) use of aspirin: Secondary | ICD-10-CM | POA: Diagnosis not present

## 2019-01-26 DIAGNOSIS — I2510 Atherosclerotic heart disease of native coronary artery without angina pectoris: Secondary | ICD-10-CM | POA: Diagnosis not present

## 2019-01-26 DIAGNOSIS — R29810 Facial weakness: Secondary | ICD-10-CM | POA: Diagnosis not present

## 2019-01-26 DIAGNOSIS — R918 Other nonspecific abnormal finding of lung field: Secondary | ICD-10-CM | POA: Diagnosis not present

## 2019-01-26 DIAGNOSIS — R202 Paresthesia of skin: Secondary | ICD-10-CM | POA: Diagnosis not present

## 2019-01-26 DIAGNOSIS — Z5181 Encounter for therapeutic drug level monitoring: Secondary | ICD-10-CM | POA: Diagnosis not present

## 2019-01-26 DIAGNOSIS — R531 Weakness: Secondary | ICD-10-CM | POA: Diagnosis not present

## 2019-01-26 DIAGNOSIS — R29818 Other symptoms and signs involving the nervous system: Secondary | ICD-10-CM | POA: Diagnosis not present

## 2019-01-26 DIAGNOSIS — G5622 Lesion of ulnar nerve, left upper limb: Secondary | ICD-10-CM | POA: Diagnosis not present

## 2019-01-26 DIAGNOSIS — Z79899 Other long term (current) drug therapy: Secondary | ICD-10-CM | POA: Diagnosis not present

## 2019-01-26 DIAGNOSIS — Z881 Allergy status to other antibiotic agents status: Secondary | ICD-10-CM | POA: Diagnosis not present

## 2019-01-26 DIAGNOSIS — I129 Hypertensive chronic kidney disease with stage 1 through stage 4 chronic kidney disease, or unspecified chronic kidney disease: Secondary | ICD-10-CM | POA: Diagnosis not present

## 2019-01-26 DIAGNOSIS — R000 Tachycardia, unspecified: Secondary | ICD-10-CM | POA: Diagnosis not present

## 2019-01-26 DIAGNOSIS — R9431 Abnormal electrocardiogram [ECG] [EKG]: Secondary | ICD-10-CM | POA: Diagnosis not present

## 2019-01-26 DIAGNOSIS — R0902 Hypoxemia: Secondary | ICD-10-CM | POA: Diagnosis not present

## 2019-01-26 DIAGNOSIS — I639 Cerebral infarction, unspecified: Secondary | ICD-10-CM | POA: Diagnosis not present

## 2019-01-26 DIAGNOSIS — R002 Palpitations: Secondary | ICD-10-CM | POA: Diagnosis not present

## 2019-01-26 DIAGNOSIS — R200 Anesthesia of skin: Secondary | ICD-10-CM | POA: Diagnosis not present

## 2019-01-27 DIAGNOSIS — G459 Transient cerebral ischemic attack, unspecified: Secondary | ICD-10-CM | POA: Diagnosis not present

## 2019-01-27 DIAGNOSIS — R200 Anesthesia of skin: Secondary | ICD-10-CM | POA: Diagnosis not present

## 2019-01-27 DIAGNOSIS — R531 Weakness: Secondary | ICD-10-CM | POA: Diagnosis not present

## 2019-01-27 DIAGNOSIS — Z881 Allergy status to other antibiotic agents status: Secondary | ICD-10-CM | POA: Diagnosis not present

## 2019-01-27 DIAGNOSIS — I6381 Other cerebral infarction due to occlusion or stenosis of small artery: Secondary | ICD-10-CM | POA: Diagnosis not present

## 2019-01-27 DIAGNOSIS — I639 Cerebral infarction, unspecified: Secondary | ICD-10-CM | POA: Diagnosis not present

## 2019-01-27 DIAGNOSIS — Z7982 Long term (current) use of aspirin: Secondary | ICD-10-CM | POA: Diagnosis not present

## 2019-01-28 ENCOUNTER — Telehealth: Payer: Self-pay

## 2019-01-28 MED ORDER — SIMVASTATIN 40 MG PO TABS
40.00 | ORAL_TABLET | ORAL | Status: DC
Start: ? — End: 2019-01-28

## 2019-01-28 MED ORDER — ASPIRIN 81 MG PO CHEW
81.00 | CHEWABLE_TABLET | ORAL | Status: DC
Start: 2019-01-28 — End: 2019-01-28

## 2019-01-28 MED ORDER — METOPROLOL SUCCINATE ER 25 MG PO TB24
25.00 | ORAL_TABLET | ORAL | Status: DC
Start: ? — End: 2019-01-28

## 2019-01-28 MED ORDER — CITALOPRAM HYDROBROMIDE 20 MG PO TABS
20.00 | ORAL_TABLET | ORAL | Status: DC
Start: 2019-01-28 — End: 2019-01-28

## 2019-01-28 NOTE — Telephone Encounter (Signed)
Attempted to reach patient for TCM call. Left message for pt to call back at [phone]

## 2019-02-05 DIAGNOSIS — I6930 Unspecified sequelae of cerebral infarction: Secondary | ICD-10-CM | POA: Diagnosis not present

## 2019-02-17 ENCOUNTER — Other Ambulatory Visit: Payer: Self-pay | Admitting: Family Medicine

## 2019-02-17 DIAGNOSIS — I10 Essential (primary) hypertension: Secondary | ICD-10-CM

## 2019-02-17 DIAGNOSIS — R200 Anesthesia of skin: Secondary | ICD-10-CM | POA: Diagnosis not present

## 2019-02-17 DIAGNOSIS — I6930 Unspecified sequelae of cerebral infarction: Secondary | ICD-10-CM | POA: Diagnosis not present

## 2019-02-17 DIAGNOSIS — R531 Weakness: Secondary | ICD-10-CM | POA: Diagnosis not present

## 2019-02-25 DIAGNOSIS — R200 Anesthesia of skin: Secondary | ICD-10-CM | POA: Diagnosis not present

## 2019-03-11 ENCOUNTER — Other Ambulatory Visit: Payer: Self-pay | Admitting: Family Medicine

## 2019-04-21 DIAGNOSIS — L570 Actinic keratosis: Secondary | ICD-10-CM | POA: Diagnosis not present

## 2019-04-21 DIAGNOSIS — L578 Other skin changes due to chronic exposure to nonionizing radiation: Secondary | ICD-10-CM | POA: Diagnosis not present

## 2019-04-21 DIAGNOSIS — Z85828 Personal history of other malignant neoplasm of skin: Secondary | ICD-10-CM | POA: Diagnosis not present

## 2019-04-21 DIAGNOSIS — Z872 Personal history of diseases of the skin and subcutaneous tissue: Secondary | ICD-10-CM | POA: Diagnosis not present

## 2019-05-05 ENCOUNTER — Other Ambulatory Visit: Payer: Self-pay | Admitting: Family Medicine

## 2019-05-05 DIAGNOSIS — I10 Essential (primary) hypertension: Secondary | ICD-10-CM

## 2019-05-25 DIAGNOSIS — I6781 Acute cerebrovascular insufficiency: Secondary | ICD-10-CM | POA: Diagnosis not present

## 2019-05-25 DIAGNOSIS — G4733 Obstructive sleep apnea (adult) (pediatric): Secondary | ICD-10-CM | POA: Diagnosis not present

## 2019-05-25 DIAGNOSIS — E669 Obesity, unspecified: Secondary | ICD-10-CM | POA: Diagnosis not present

## 2019-07-09 ENCOUNTER — Inpatient Hospital Stay
Admission: EM | Admit: 2019-07-09 | Discharge: 2019-07-12 | DRG: 378 | Disposition: A | Discharge status: Home / Self Care | Payer: Medicare Other | Attending: Internal Medicine | Admitting: Internal Medicine

## 2019-07-09 DIAGNOSIS — K920 Hematemesis: Principal | ICD-10-CM | POA: Diagnosis present

## 2019-07-09 DIAGNOSIS — F329 Major depressive disorder, single episode, unspecified: Secondary | ICD-10-CM | POA: Diagnosis present

## 2019-07-09 DIAGNOSIS — K922 Gastrointestinal hemorrhage, unspecified: Secondary | ICD-10-CM | POA: Diagnosis not present

## 2019-07-09 DIAGNOSIS — K921 Melena: Secondary | ICD-10-CM | POA: Diagnosis present

## 2019-07-09 DIAGNOSIS — N183 Chronic kidney disease, stage 3 unspecified: Secondary | ICD-10-CM | POA: Diagnosis present

## 2019-07-09 DIAGNOSIS — Z7982 Long term (current) use of aspirin: Secondary | ICD-10-CM

## 2019-07-09 DIAGNOSIS — Z79899 Other long term (current) drug therapy: Secondary | ICD-10-CM

## 2019-07-09 DIAGNOSIS — I10 Essential (primary) hypertension: Secondary | ICD-10-CM | POA: Diagnosis present

## 2019-07-09 DIAGNOSIS — I2510 Atherosclerotic heart disease of native coronary artery without angina pectoris: Secondary | ICD-10-CM | POA: Diagnosis present

## 2019-07-09 DIAGNOSIS — E782 Mixed hyperlipidemia: Secondary | ICD-10-CM | POA: Diagnosis not present

## 2019-07-09 DIAGNOSIS — N1832 Chronic kidney disease, stage 3b: Secondary | ICD-10-CM | POA: Diagnosis present

## 2019-07-09 DIAGNOSIS — I129 Hypertensive chronic kidney disease with stage 1 through stage 4 chronic kidney disease, or unspecified chronic kidney disease: Secondary | ICD-10-CM | POA: Diagnosis present

## 2019-07-09 DIAGNOSIS — K274 Chronic or unspecified peptic ulcer, site unspecified, with hemorrhage: Secondary | ICD-10-CM | POA: Diagnosis not present

## 2019-07-09 DIAGNOSIS — K2210 Ulcer of esophagus without bleeding: Secondary | ICD-10-CM

## 2019-07-09 DIAGNOSIS — Z20828 Contact with and (suspected) exposure to other viral communicable diseases: Secondary | ICD-10-CM | POA: Diagnosis present

## 2019-07-09 DIAGNOSIS — Z955 Presence of coronary angioplasty implant and graft: Secondary | ICD-10-CM | POA: Diagnosis not present

## 2019-07-09 DIAGNOSIS — Z888 Allergy status to other drugs, medicaments and biological substances status: Secondary | ICD-10-CM

## 2019-07-09 DIAGNOSIS — G4730 Sleep apnea, unspecified: Secondary | ICD-10-CM | POA: Diagnosis not present

## 2019-07-09 DIAGNOSIS — I340 Nonrheumatic mitral (valve) insufficiency: Secondary | ICD-10-CM | POA: Diagnosis present

## 2019-07-09 DIAGNOSIS — Z85828 Personal history of other malignant neoplasm of skin: Secondary | ICD-10-CM | POA: Diagnosis not present

## 2019-07-09 DIAGNOSIS — E785 Hyperlipidemia, unspecified: Secondary | ICD-10-CM | POA: Diagnosis present

## 2019-07-09 DIAGNOSIS — K219 Gastro-esophageal reflux disease without esophagitis: Secondary | ICD-10-CM | POA: Diagnosis present

## 2019-07-09 DIAGNOSIS — Z87442 Personal history of urinary calculi: Secondary | ICD-10-CM

## 2019-07-09 DIAGNOSIS — Z7902 Long term (current) use of antithrombotics/antiplatelets: Secondary | ICD-10-CM | POA: Diagnosis not present

## 2019-07-09 DIAGNOSIS — Z03818 Encounter for observation for suspected exposure to other biological agents ruled out: Secondary | ICD-10-CM | POA: Diagnosis not present

## 2019-07-09 LAB — COMPREHENSIVE METABOLIC PANEL
ALT: 33 U/L (ref 0–44)
AST: 46 U/L — ABNORMAL HIGH (ref 15–41)
Albumin: 4.5 g/dL (ref 3.5–5.0)
Alkaline Phosphatase: 50 U/L (ref 38–126)
Anion gap: 14 (ref 5–15)
BUN: 60 mg/dL — ABNORMAL HIGH (ref 8–23)
CO2: 19 mmol/L — ABNORMAL LOW (ref 22–32)
Calcium: 10.2 mg/dL (ref 8.9–10.3)
Chloride: 106 mmol/L (ref 98–111)
Creatinine, Ser: 1.78 mg/dL — ABNORMAL HIGH (ref 0.61–1.24)
GFR calc Af Amer: 43 mL/min — ABNORMAL LOW (ref >60)
GFR calc non Af Amer: 37 mL/min — ABNORMAL LOW (ref >60)
Glucose, Bld: 164 mg/dL — ABNORMAL HIGH (ref 70–99)
Potassium: 3.8 mmol/L (ref 3.5–5.1)
Sodium: 139 mmol/L (ref 135–145)
Total Bilirubin: 1.7 mg/dL — ABNORMAL HIGH (ref 0.3–1.2)
Total Protein: 7.6 g/dL (ref 6.5–8.1)

## 2019-07-09 LAB — CBC
HCT: 44.4 % (ref 39.0–52.0)
Hemoglobin: 14.8 g/dL (ref 13.0–17.0)
MCH: 29.2 pg (ref 26.0–34.0)
MCHC: 33.3 g/dL (ref 30.0–36.0)
MCV: 87.7 fL (ref 80.0–100.0)
Platelets: 225 10*3/uL (ref 150–400)
RBC: 5.06 MIL/uL (ref 4.22–5.81)
RDW: 13.5 % (ref 11.5–15.5)
WBC: 15.9 10*3/uL — ABNORMAL HIGH (ref 4.0–10.5)
nRBC: 0 % (ref 0.0–0.2)

## 2019-07-09 LAB — TYPE AND SCREEN
ABO/RH(D): O POS
Antibody Screen: NEGATIVE

## 2019-07-09 MED ORDER — SODIUM CHLORIDE 0.9 % IV SOLN
80.0000 mg | Freq: Once | INTRAVENOUS | Status: AC
  Administered 2019-07-09: 22:00:00 80 mg via INTRAVENOUS
  Filled 2019-07-09: qty 80

## 2019-07-09 MED ORDER — SODIUM CHLORIDE 0.9 % IV SOLN
8.0000 mg/h | INTRAVENOUS | Status: DC
  Administered 2019-07-09 – 2019-07-12 (×5): 8 mg/h via INTRAVENOUS
  Filled 2019-07-09 (×6): qty 80

## 2019-07-09 MED ORDER — PANTOPRAZOLE SODIUM 40 MG IV SOLR: 40.0000 mg | Freq: Two times a day (BID) | INTRAVENOUS | Status: DC

## 2019-07-09 MED ORDER — SODIUM CHLORIDE 0.9 % IV SOLN
Freq: Once | INTRAVENOUS | Status: AC
  Administered 2019-07-09: 22:00:00 via INTRAVENOUS
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
## 2019-07-09 NOTE — ED Triage Notes (Signed)
Patient c/o hematemesis beginning yesterday. Large coffee ground emesis witnessed in lobby. Patient on plavix for stroke in March.
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
## 2019-07-10 ENCOUNTER — Other Ambulatory Visit: Payer: Self-pay

## 2019-07-10 DIAGNOSIS — K920 Hematemesis: Principal | ICD-10-CM

## 2019-07-10 LAB — HEMOGLOBIN: Hemoglobin: 12.4 g/dL — ABNORMAL LOW (ref 13.0–17.0)

## 2019-07-10 LAB — BASIC METABOLIC PANEL
Anion gap: 9 (ref 5–15)
BUN: 54 mg/dL — ABNORMAL HIGH (ref 8–23)
CO2: 18 mmol/L — ABNORMAL LOW (ref 22–32)
Calcium: 9.2 mg/dL (ref 8.9–10.3)
Chloride: 113 mmol/L — ABNORMAL HIGH (ref 98–111)
Creatinine, Ser: 1.5 mg/dL — ABNORMAL HIGH (ref 0.61–1.24)
GFR calc Af Amer: 53 mL/min — ABNORMAL LOW (ref >60)
GFR calc non Af Amer: 46 mL/min — ABNORMAL LOW (ref >60)
Glucose, Bld: 126 mg/dL — ABNORMAL HIGH (ref 70–99)
Potassium: 3.8 mmol/L (ref 3.5–5.1)
Sodium: 140 mmol/L (ref 135–145)

## 2019-07-10 LAB — CBC
HCT: 38.5 % — ABNORMAL LOW (ref 39.0–52.0)
Hemoglobin: 12.9 g/dL — ABNORMAL LOW (ref 13.0–17.0)
MCH: 29.6 pg (ref 26.0–34.0)
MCHC: 33.5 g/dL (ref 30.0–36.0)
MCV: 88.3 fL (ref 80.0–100.0)
Platelets: 172 10*3/uL (ref 150–400)
RBC: 4.36 MIL/uL (ref 4.22–5.81)
RDW: 13.5 % (ref 11.5–15.5)
WBC: 13.7 10*3/uL — ABNORMAL HIGH (ref 4.0–10.5)
nRBC: 0 % (ref 0.0–0.2)

## 2019-07-10 LAB — OCCULT BLOOD X 1 CARD TO LAB, STOOL: Fecal Occult Bld: POSITIVE — AB

## 2019-07-10 LAB — SARS CORONAVIRUS 2 (TAT 6-24 HRS): SARS Coronavirus 2: NEGATIVE

## 2019-07-10 MED ORDER — ACETAMINOPHEN 325 MG PO TABS: 650.0000 mg | ORAL_TABLET | Freq: Four times a day (QID) | ORAL | Status: DC | PRN

## 2019-07-10 MED ORDER — ONDANSETRON HCL 4 MG/2ML IJ SOLN: 4.0000 mg | Freq: Four times a day (QID) | INTRAMUSCULAR | Status: DC | PRN

## 2019-07-10 MED ORDER — ONDANSETRON HCL 4 MG PO TABS: 4.0000 mg | ORAL_TABLET | Freq: Four times a day (QID) | ORAL | Status: DC | PRN

## 2019-07-10 MED ORDER — SODIUM CHLORIDE 0.9 % IV SOLN
Freq: Once | INTRAVENOUS | Status: AC
  Administered 2019-07-10: 16:00:00 via INTRAVENOUS

## 2019-07-10 MED ORDER — METOPROLOL SUCCINATE ER 25 MG PO TB24
25.0000 mg | ORAL_TABLET | Freq: Every day | ORAL | Status: DC
  Administered 2019-07-10 – 2019-07-11 (×2): 25 mg via ORAL
  Filled 2019-07-10 (×2): qty 1

## 2019-07-10 MED ORDER — ACETAMINOPHEN 650 MG RE SUPP: 650.0000 mg | Freq: Four times a day (QID) | RECTAL | Status: DC | PRN
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
## 2019-07-10 NOTE — Progress Notes (Signed)
[facility] at [place] NAME: Luis Strong    MR#:  [id number]  DATE OF BIRTH:  [birth year]  SUBJECTIVE:  CHIEF COMPLAINT:   Chief Complaint  Patient presents with  . Hematemesis   Patient had melena this morning. REVIEW OF SYSTEMS:  Review of Systems  Constitutional: Negative for chills, fever and malaise/fatigue.  HENT: Negative for sore throat.   Eyes: Negative for blurred vision and double vision.  Respiratory: Negative for cough, hemoptysis, shortness of breath, wheezing and stridor.   Cardiovascular: Negative for chest pain, palpitations, orthopnea and leg swelling.  Gastrointestinal: Positive for melena. Negative for abdominal pain, blood in stool, diarrhea, nausea and vomiting.  Genitourinary: Negative for dysuria, flank pain and hematuria.  Musculoskeletal: Negative for back pain and joint pain.  Skin: Negative for rash.  Neurological: Negative for dizziness, sensory change, focal weakness, seizures, loss of consciousness, weakness and headaches.  Endo/Heme/Allergies: Negative for polydipsia.  Psychiatric/Behavioral: Negative for depression. The patient is not nervous/anxious.     DRUG ALLERGIES:   Allergies  Allergen Reactions  . Capsaicin Itching and Rash    Severe rash and itching.   VITALS:  Blood pressure 129/72, pulse 84, temperature 97.9 F (36.6 C), temperature source Oral, resp. rate 17, height 5\' 9"  (1.753 m), weight 92.8 kg, SpO2 99 %. PHYSICAL EXAMINATION:  Physical Exam Constitutional:      General: He is not in acute distress. HENT:     Head: Normocephalic.     Mouth/Throat:     Mouth: Mucous membranes are moist.  Eyes:     General: No scleral icterus.    Conjunctiva/sclera: Conjunctivae normal.     Pupils: Pupils are equal, round, and reactive to light.  Neck:     Musculoskeletal: Normal range of motion and neck supple.     Vascular: No JVD.     Trachea: No tracheal deviation.   Cardiovascular:     Rate and Rhythm: Normal rate and regular rhythm.     Heart sounds: Normal heart sounds. No murmur. No gallop.   Pulmonary:     Effort: Pulmonary effort is normal. No respiratory distress.     Breath sounds: Normal breath sounds. No wheezing or rales.  Abdominal:     General: Bowel sounds are normal. There is no distension.     Palpations: Abdomen is soft.     Tenderness: There is no abdominal tenderness. There is no rebound.  Musculoskeletal: Normal range of motion.        General: No tenderness.     Right lower leg: No edema.     Left lower leg: No edema.  Skin:    Findings: No erythema or rash.  Neurological:     Mental Status: He is alert and oriented to person, place, and time.     Cranial Nerves: No cranial nerve deficit.  Psychiatric:        Mood and Affect: Mood normal.    LABORATORY PANEL:  Male CBC Recent Labs  Lab 07/10/19 0235 07/10/19 1317  WBC 13.7*  --   HGB 12.9* 12.4*  HCT 38.5*  --   PLT 172  --    ------------------------------------------------------------------------------------------------------------------ Chemistries  Recent Labs  Lab 07/09/19 2048 07/10/19 0235  NA 139 140  K 3.8 3.8  CL 106 113*  CO2 19* 18*  GLUCOSE 164* 126*  BUN 60* 54*  CREATININE 1.78* 1.50*  CALCIUM 10.2 9.2  AST 46*  --   ALT 33  --  ALKPHOS 50  --   BILITOT 1.7*  --    RADIOLOGY:  No results found. ASSESSMENT AND PLAN:   GI bleed, suspect upper GI bleed.   Continue Protonix drip.    Clear liquid for now, n.p.o. after midnight for possible EGD tomorrow per Dr. Vicente Males.  Follow-up hemoglobin.   Benig essential HTN -home dose antihypertensives   Arteriosclerosis of coronary artery -continue home meds   Stage 3 chronic kidney disease ([place]) -at baseline, avoid nephrotoxins and monitor   GERD (gastroesophageal reflux disease) -PPI as above  All the records are reviewed and case discussed with Care Management/Social Worker. Management plans  discussed with the patient, family and they are in agreement.  CODE STATUS: Full Code  TOTAL TIME TAKING CARE OF THIS PATIENT: 27 minutes.   More than 50% of the time was spent in counseling/coordination of care: YES  POSSIBLE D/C IN 1-2 DAYS, DEPENDING ON CLINICAL CONDITION.   Demetrios Loll M.D on 07/10/2019 at 3:00 PM  Between 7am to 6pm - Pager - [phone]  After 6pm go to [email or web address] - [facility]

## 2019-07-11 ENCOUNTER — Encounter: Payer: Self-pay | Admitting: Anesthesiology

## 2019-07-11 DIAGNOSIS — K274 Chronic or unspecified peptic ulcer, site unspecified, with hemorrhage: Secondary | ICD-10-CM

## 2019-07-11 LAB — HEMOGLOBIN: Hemoglobin: 11.2 g/dL — ABNORMAL LOW (ref 13.0–17.0)

## 2019-07-11 NOTE — Progress Notes (Signed)
[place] at [place] NAME: Luis Strong    MR#:  [id number]  DATE OF BIRTH:  Sep 23, 1946  SUBJECTIVE:  CHIEF COMPLAINT:   Chief Complaint  Patient presents with  . Hematemesis   Patient has no melena for the past 24 hours. REVIEW OF SYSTEMS:  Review of Systems  Constitutional: Negative for chills, fever and malaise/fatigue.  HENT: Negative for sore throat.   Eyes: Negative for blurred vision and double vision.  Respiratory: Negative for cough, hemoptysis, shortness of breath, wheezing and stridor.   Cardiovascular: Negative for chest pain, palpitations, orthopnea and leg swelling.  Gastrointestinal: Negative for abdominal pain, blood in stool, diarrhea, melena, nausea and vomiting.  Genitourinary: Negative for dysuria, flank pain and hematuria.  Musculoskeletal: Negative for back pain and joint pain.  Skin: Negative for rash.  Neurological: Negative for dizziness, sensory change, focal weakness, seizures, loss of consciousness, weakness and headaches.  Endo/Heme/Allergies: Negative for polydipsia.  Psychiatric/Behavioral: Negative for depression. The patient is not nervous/anxious.     DRUG ALLERGIES:   Allergies  Allergen Reactions  . Capsaicin Itching and Rash    Severe rash and itching.   VITALS:  Blood pressure 116/63, pulse 71, temperature 98.2 F (36.8 C), temperature source Oral, resp. rate 18, height 5\' 9"  (1.753 m), weight 92.8 kg, SpO2 99 %. PHYSICAL EXAMINATION:  Physical Exam Constitutional:      General: He is not in acute distress. HENT:     Head: Normocephalic.     Mouth/Throat:     Mouth: Mucous membranes are moist.  Eyes:     General: No scleral icterus.    Conjunctiva/sclera: Conjunctivae normal.     Pupils: Pupils are equal, round, and reactive to light.  Neck:     Musculoskeletal: Normal range of motion and neck supple.     Vascular: No JVD.     Trachea: No tracheal deviation.  Cardiovascular:      Rate and Rhythm: Normal rate and regular rhythm.     Heart sounds: Normal heart sounds. No murmur. No gallop.   Pulmonary:     Effort: Pulmonary effort is normal. No respiratory distress.     Breath sounds: Normal breath sounds. No wheezing or rales.  Abdominal:     General: Bowel sounds are normal. There is no distension.     Palpations: Abdomen is soft.     Tenderness: There is no abdominal tenderness. There is no rebound.  Musculoskeletal: Normal range of motion.        General: No tenderness.     Right lower leg: No edema.     Left lower leg: No edema.  Skin:    Findings: No erythema or rash.  Neurological:     Mental Status: He is alert and oriented to person, place, and time.     Cranial Nerves: No cranial nerve deficit.  Psychiatric:        Mood and Affect: Mood normal.    LABORATORY PANEL:  Male CBC Recent Labs  Lab 07/10/19 0235  07/11/19 0701  WBC 13.7*  --   --   HGB 12.9*   < > 11.2*  HCT 38.5*  --   --   PLT 172  --   --    < > = values in this interval not displayed.   ------------------------------------------------------------------------------------------------------------------ Chemistries  Recent Labs  Lab 07/09/19 2048 07/10/19 0235  NA 139 140  K 3.8 3.8  CL 106 113*  CO2 19*  18*  GLUCOSE 164* 126*  BUN 60* 54*  CREATININE 1.78* 1.50*  CALCIUM 10.2 9.2  AST 46*  --   ALT 33  --   ALKPHOS 50  --   BILITOT 1.7*  --    RADIOLOGY:  No results found. ASSESSMENT AND PLAN:   GI bleed, suspect upper GI bleed.   Continue Protonix drip.    Clear liquid for now, n.p.o. after midnight for EGD tomorrow per Dr. Allen Norris.  Need to be off Plavix for 3 days for endoscopy.  Hemoglobin decreased but stable.   Benig essential HTN -home dose antihypertensives   Arteriosclerosis of coronary artery -continue home meds   Stage 3 chronic kidney disease (H[facility]) -at baseline, avoid nephrotoxins and monitor   GERD (gastroesophageal reflux disease) -PPI as above   I called his son, nobody answered the phone. All the records are reviewed and case discussed with Care Management/Social Worker. Management plans discussed with the patient, family and they are in agreement.  CODE STATUS: Full Code  TOTAL TIME TAKING CARE OF THIS PATIENT: 25 minutes.    More than 50% of the time was spent in counseling/coordination of care: YES  POSSIBLE D/C IN 1-2 DAYS, DEPENDING ON CLINICAL CONDITION.   Demetrios Loll M.D on 07/11/2019 at 3:24 PM  Between 7am to 6pm - Pager - [phone]  After 6pm go to [email or web address] - [facility]

## 2019-07-11 NOTE — Anesthesia Preprocedure Evaluation (Addendum)
Anesthesia Evaluation  Patient identified by MRN, date of birth, ID band Patient awake    Reviewed: Allergy & Precautions, NPO status , Patient's Chart, lab work & pertinent test results, reviewed documented beta blocker date and time   History of Anesthesia Complications History of anesthetic complications: Headache after stent 2015.  Airway Mallampati: III  TM Distance: >3 FB Neck ROM: Full    Dental no notable dental hx.    Pulmonary sleep apnea ,    Pulmonary exam normal breath sounds clear to auscultation       Cardiovascular hypertension, + CAD (s/p LAD stent 2015)  Normal cardiovascular exam Rhythm:Regular Rate:Normal     Neuro/Psych PSYCHIATRIC DISORDERS Depression negative neurological ROS     GI/Hepatic Neg liver ROS, GERD  ,  Endo/Other  negative endocrine ROS  Renal/GU Renal InsufficiencyRenal disease     Musculoskeletal negative musculoskeletal ROS (+)   Abdominal Normal abdominal exam  (+)  Abdomen: soft.    Peds negative pediatric ROS (+)  Hematology negative hematology ROS (+)   Anesthesia Other Findings Past Medical History: 11/15/2015: Abnormal cardiovascular stress test 11/16/2014: Arteriosclerosis of coronary artery     Comment:  Overview:  Sp pci stetn of lad 2015  11/16/2014: Benign essential HTN 2001: Cancer ([place])     Comment:  skin cancer on face 11/03/2014: Chest pain 11/15/2015: Combined fat and carbohydrate induced hyperlipemia [id number]: Complication of anesthesia     Comment:  severe head ache with cardiac stent placement. Refered               to neurologist. No date: Depression No date: GERD (gastroesophageal reflux disease) No date: Hyperlipidemia No date: Hypertension No date: Kidney stones     Comment:  x 20 years 04/20/2014: MI (mitral incompetence)  Reproductive/Obstetrics                            Anesthesia Physical  Anesthesia  Plan  ASA: III  Anesthesia Plan: General   Post-op Pain Management:    Induction: Intravenous  PONV Risk Score and Plan: Propofol infusion  Airway Management Planned: Nasal Cannula  Additional Equipment:   Intra-op Plan:   Post-operative Plan:   Informed Consent: I have reviewed the patients History and Physical, chart, labs and discussed the procedure including the risks, benefits and alternatives for the proposed anesthesia with the patient or authorized representative who has indicated his/her understanding and acceptance.       Plan Discussed with: CRNA, Surgeon and Anesthesiologist  Anesthesia Plan Comments:         Anesthesia Quick Evaluation

## 2019-07-11 NOTE — Progress Notes (Signed)
[name], MD [facility]   [address], [place], [place] [address] Phone: [phone] Fax : [phone]   Subjective: The patient reports that he has had no further hematemesis.  The patient has continued to have dark stools but they are more formed than they were prior.  The patient denies taking any NSAIDs but clearly has signs of GI bleeding and possible peptic ulcer disease.  The patient had a hemoglobin of 12.4 yesterday with a repeat being 11.2 this morning.  He denies any abdominal pain fevers chills nausea vomiting.   Objective: Vital signs in last 24 hours: Vitals:   07/10/19 0233 07/10/19 1342 07/10/19 2023 07/11/19 0448  BP: (!) 123/95 129/72 124/65 115/73  Pulse: (!) 107 84 87 81  Resp: 17 17 18 18   Temp: 98.4 F (36.9 C) 97.9 F (36.6 C) 98.4 F (36.9 C) 98.5 F (36.9 C)  TempSrc: Oral Oral Oral   SpO2: 99% 99% 99% 99%  Weight:      Height:       Weight change:   Intake/Output Summary (Last 24 hours) at 07/11/2019 1212 Last data filed at 07/11/2019 [id number] Gross per 24 hour  Intake 414.88 ml  Output 1425 ml  Net -1010.12 ml     Exam: Heart:: Regular rate and rhythm, S1S2 present or without murmur or extra heart sounds Lungs: normal and clear to auscultation and percussion Abdomen: soft, nontender, normal bowel sounds   Lab Results: @LABTEST2 @ Micro Results: Recent Results (from the past 240 hour(s))  SARS CORONAVIRUS 2 Nasal Swab Aptima Multi Swab     Status: None   Collection Time: 07/09/19 10:45 PM   Specimen: Aptima Multi Swab; Nasal Swab  Result Value Ref Range Status   SARS Coronavirus 2 NEGATIVE NEGATIVE Final    Comment: (NOTE) SARS-CoV-2 target nucleic acids are NOT DETECTED. The SARS-CoV-2 RNA is generally detectable in upper and lower respiratory specimens during the acute phase of infection. Negative results do not preclude SARS-CoV-2 infection, do not rule out co-infections with other pathogens, and should not be used as the  sole basis for treatment or other patient management decisions. Negative results must be combined with clinical observations, patient history, and epidemiological information. The expected result is Negative. Fact Sheet for Patients: SugarRoll.be Fact Sheet for Healthcare Providers: https://www.woods-mathews.com/ This test is not yet approved or cleared by the [place] FDA and  has been authorized for detection and/or diagnosis of SARS-CoV-2 by FDA under an Emergency Use Authorization (EUA). This EUA will remain  in effect (meaning this test can be used) for the duration of the COVID-19 declaration under Section 56 4(b)(1) of the Act, 21 U.S.C. section 360bbb-3(b)(1), unless the authorization is terminated or revoked sooner. Performed at [facility], [place] [address] [place], [place] [address]    Studies/Results: No results found. Medications: I have reviewed the patient's current medications. Scheduled Meds: . metoprolol succinate  25 mg Oral QHS  . [START ON 07/13/2019] pantoprazole  40 mg Intravenous Q12H   Continuous Infusions: . pantoprozole (PROTONIX) infusion 8 mg/hr (07/10/19 1825)   PRN Meds:.acetaminophen **OR** acetaminophen, ondansetron **OR** ondansetron (ZOFRAN) IV   Assessment: Principal Problem:   GI bleed Active Problems:   Benign essential HTN   Arteriosclerosis of coronary artery   Stage 3 chronic kidney disease (HCC)   GERD (gastroesophageal reflux disease)   GI bleeding    Plan: This patient has had some melena but it is getting more solid than it had been on admission.  He has had no further sign of hematemesis.  The patient's hemoglobin has gone down and he is only been off the Plavix for 2 days.  The patient has been told that a minimum of 3 days off Plavix would be better than doing the upper endoscopy was on Plavix.  The patient will be set up for an EGD for tomorrow. I have discussed risks &  benefits which include, but are not limited to, bleeding, infection, perforation & drug reaction.  The patient agrees with this plan & written consent will be obtained.     LOS: 2 days   Yasiel Goyne 07/11/2019, 12:12 PM

## 2019-07-12 ENCOUNTER — Encounter
Admission: EM | Disposition: A | Discharge status: Home / Self Care | Payer: Self-pay | Source: Home / Self Care | Attending: Internal Medicine

## 2019-07-12 ENCOUNTER — Inpatient Hospital Stay: Payer: Medicare Other | Admitting: Anesthesiology

## 2019-07-12 DIAGNOSIS — K2210 Ulcer of esophagus without bleeding: Secondary | ICD-10-CM

## 2019-07-12 DIAGNOSIS — K921 Melena: Secondary | ICD-10-CM

## 2019-07-12 HISTORY — PX: ESOPHAGOGASTRODUODENOSCOPY (EGD) WITH PROPOFOL: SHX5813

## 2019-07-12 LAB — HEMOGLOBIN: Hemoglobin: 10.8 g/dL — ABNORMAL LOW (ref 13.0–17.0)

## 2019-07-12 SURGERY — ESOPHAGOGASTRODUODENOSCOPY (EGD) WITH PROPOFOL
Anesthesia: General

## 2019-07-12 MED ORDER — PROPOFOL 500 MG/50ML IV EMUL
INTRAVENOUS | Status: DC | PRN
  Administered 2019-07-12: 125 ug/kg/min via INTRAVENOUS

## 2019-07-12 MED ORDER — SODIUM CHLORIDE 0.9 % IV SOLN
Freq: Once | INTRAVENOUS | Status: AC
  Administered 2019-07-12: 08:00:00 via INTRAVENOUS

## 2019-07-12 MED ORDER — PROPOFOL 10 MG/ML IV BOLUS
INTRAVENOUS | Status: DC | PRN
  Administered 2019-07-12: 50 mg via INTRAVENOUS

## 2019-07-12 MED ORDER — PROPOFOL 500 MG/50ML IV EMUL
INTRAVENOUS | Status: AC
  Filled 2019-07-12: qty 50

## 2019-07-12 MED ORDER — PROPOFOL 10 MG/ML IV BOLUS
INTRAVENOUS | Status: AC
  Filled 2019-07-12: qty 20

## 2019-07-12 MED ORDER — PANTOPRAZOLE SODIUM 40 MG PO TBEC: 40.0000 mg | DELAYED_RELEASE_TABLET | Freq: Two times a day (BID) | ORAL | 1 refills | Status: DC

## 2019-07-12 MED ORDER — PANTOPRAZOLE SODIUM 40 MG IV SOLR
40.0000 mg | Freq: Two times a day (BID) | INTRAVENOUS | Status: DC
  Administered 2019-07-12: 09:00:00 40 mg via INTRAVENOUS

## 2019-07-12 MED ORDER — LIDOCAINE HCL (PF) 2 % IJ SOLN
INTRAMUSCULAR | Status: DC | PRN
  Administered 2019-07-12: 100 mg via INTRADERMAL

## 2019-07-12 MED ORDER — PANTOPRAZOLE SODIUM 40 MG PO TBEC: 40.0000 mg | DELAYED_RELEASE_TABLET | Freq: Two times a day (BID) | ORAL | Status: DC

## 2019-07-12 NOTE — Anesthesia Procedure Notes (Signed)
Date/Time: 07/12/2019 8:08 AM Performed by: Nelda Marseille, CRNA Pre-anesthesia Checklist: Patient identified, Emergency Drugs available, Suction available, Patient being monitored and Timeout performed Oxygen Delivery Method: Nasal cannula

## 2019-07-12 NOTE — Anesthesia Postprocedure Evaluation (Signed)
Anesthesia Post Note  Patient: Luis Strong  Procedure(s) Performed: ESOPHAGOGASTRODUODENOSCOPY (EGD) WITH PROPOFOL (N/A )  Patient location during evaluation: PACU Anesthesia Type: General Level of consciousness: awake and alert and oriented Pain management: pain level controlled Vital Signs Assessment: post-procedure vital signs reviewed and stable Respiratory status: spontaneous breathing Cardiovascular status: blood pressure returned to baseline Anesthetic complications: no     Last Vitals:  Vitals:   07/12/19 0844 07/12/19 0917  BP: (!) 104/59 (!) 126/59  Pulse: 63 63  Resp: 10 12  Temp:  36.6 C  SpO2: 99% 100%    Last Pain:  Vitals:   07/12/19 0931  TempSrc:   PainSc: 0-No pain                 Carlesha Seiple

## 2019-07-12 NOTE — Transfer of Care (Signed)
Immediate Anesthesia Transfer of Care Note  Patient: Luis Strong  Procedure(s) Performed: ESOPHAGOGASTRODUODENOSCOPY (EGD) WITH PROPOFOL (N/A )  Patient Location: PACU  Anesthesia Type:General  Level of Consciousness: sedated  Airway & Oxygen Therapy: Patient Spontanous Breathing and Patient connected to nasal cannula oxygen  Post-op Assessment: Report given to RN and Post -op Vital signs reviewed and stable  Post vital signs: Reviewed and stable  Last Vitals:  Vitals Value Taken Time  BP    Temp    Pulse    Resp    SpO2      Last Pain:  Vitals:   07/12/19 0740  TempSrc: Oral  PainSc: 0-No pain         Complications: No apparent anesthesia complications

## 2019-07-12 NOTE — Discharge Summary (Signed)
[facility] NAME: Luis Strong    MR#:  [id number]  DATE OF BIRTH:  16-Jun-1946  DATE OF ADMISSION:  07/09/2019   ADMITTING PHYSICIAN: Lance Coon, MD  DATE OF DISCHARGE: 07/12/2019  2:13 PM  PRIMARY CARE PHYSICIAN: Juline Patch, MD   ADMISSION DIAGNOSIS:  Hematemesis, presence of nausea not specified [K92.0] DISCHARGE DIAGNOSIS:  Principal Problem:   GI bleed Active Problems:   Benign essential HTN   Arteriosclerosis of coronary artery   Stage 3 chronic kidney disease (HCC)   GERD (gastroesophageal reflux disease)   GI bleeding   Melena   Ulcer of esophagus without bleeding  SECONDARY DIAGNOSIS:   Past Medical History:  Diagnosis Date  . Abnormal cardiovascular stress test 11/15/2015  . Arteriosclerosis of coronary artery 11/16/2014   Overview:  Sp pci stetn of lad 2015   . Benign essential HTN 11/16/2014  . Cancer ([place]) 2001   skin cancer on face  . Chest pain 11/03/2014  . Combined fat and carbohydrate induced hyperlipemia 11/15/2015  . Complication of anesthesia 10/2014   severe head ache with cardiac stent placement. Refered to neurologist.  . Depression   . GERD (gastroesophageal reflux disease)   . Hyperlipidemia   . Hypertension   . Kidney stones    x 20 years  . MI (mitral incompetence) 04/20/2014   HOSPITAL COURSE:  GI bleed, suspect upper GI bleed.  The patient has been treated with Protonix drip.  EGD report Non-bleeding esophageal ulcer. The ulcer may be the results of a mallory- weiss tear healing.  Change to Protonix p.o. twice daily.  Hemoglobin decreased but stable.  Benign essential HTN -home dose antihypertensives Arteriosclerosis of coronary artery -continue home meds Stage 3 chronic kidney disease (HCC) -at baseline, avoid nephrotoxins and monitor GERD (gastroesophageal reflux disease) -PPI as above Discussed with Dr. Allen Norris. DISCHARGE CONDITIONS:  Stable, discharged to  home today. CONSULTS OBTAINED:  Treatment Team:  Jonathon Bellows, MD DRUG ALLERGIES:   Allergies  Allergen Reactions  . Capsaicin Itching and Rash    Severe rash and itching.   DISCHARGE MEDICATIONS:   Allergies as of 07/12/2019      Reactions   Capsaicin Itching, Rash   Severe rash and itching.      Medication List    STOP taking these medications   acyclovir 200 MG capsule Commonly known as: ZOVIRAX   aspirin 81 MG chewable tablet   clopidogrel 75 MG tablet Commonly known as: PLAVIX   simvastatin 40 MG tablet Commonly known as: ZOCOR     TAKE these medications   atorvastatin 80 MG tablet Commonly known as: LIPITOR Take 80 mg by mouth daily.   cetirizine 10 MG tablet Commonly known as: ZYRTEC Take 10 mg by mouth as needed for allergies.   citalopram 20 MG tablet Commonly known as: CELEXA TAKE 1 TABLET DAILY   Flaxseed Oil 1200 MG Caps Take by mouth daily.   omega-3 acid ethyl esters 1 g capsule Commonly known as: LOVAZA Take 2 g by mouth 2 (two) times daily.   OMEGA-3 FISH OIL PO Take 1,040 mg by mouth daily.   pantoprazole 40 MG tablet Commonly known as: PROTONIX Take 1 tablet (40 mg total) by mouth 2 (two) times daily before a meal.   PLANT STANOL ESTER PO Take 900 mg by mouth daily.   Resveratrol 250 MG Caps Take by mouth daily.   Toprol XL 25 MG 24 hr tablet Generic  drug: metoprolol succinate TAKE 1 TABLET AT BEDTIME        DISCHARGE INSTRUCTIONS:  See AVS. If you experience worsening of your admission symptoms, develop shortness of breath, life threatening emergency, suicidal or homicidal thoughts you must seek medical attention immediately by calling 911 or calling your MD immediately  if symptoms less severe.  You Must read complete instructions/literature along with all the possible adverse reactions/side effects for all the Medicines you take and that have been prescribed to you. Take any new Medicines after you have completely  understood and accpet all the possible adverse reactions/side effects.   Please note  You were cared for by a hospitalist during your hospital stay. If you have any questions about your discharge medications or the care you received while you were in the hospital after you are discharged, you can call the unit and asked to speak with the hospitalist on call if the hospitalist that took care of you is not available. Once you are discharged, your primary care physician will handle any further medical issues. Please note that NO REFILLS for any discharge medications will be authorized once you are discharged, as it is imperative that you return to your primary care physician (or establish a relationship with a primary care physician if you do not have one) for your aftercare needs so that they can reassess your need for medications and monitor your lab values.    On the day of Discharge:  VITAL SIGNS:  Blood pressure (!) 126/59, pulse 63, temperature 97.8 F (36.6 C), temperature source Oral, resp. rate 12, height 5\' 9"  (1.753 m), weight 93 kg, SpO2 100 %. PHYSICAL EXAMINATION:  GENERAL:  73 y.o.-year-old patient lying in the bed with no acute distress.  EYES: Pupils equal, round, reactive to light and accommodation. No scleral icterus. Extraocular muscles intact.  HEENT: Head atraumatic, normocephalic. Oropharynx and nasopharynx clear.  NECK:  Supple, no jugular venous distention. No thyroid enlargement, no tenderness.  LUNGS: Normal breath sounds bilaterally, no wheezing, rales,rhonchi or crepitation. No use of accessory muscles of respiration.  CARDIOVASCULAR: S1, S2 normal. No murmurs, rubs, or gallops.  ABDOMEN: Soft, non-tender, non-distended. Bowel sounds present. No organomegaly or mass.  EXTREMITIES: No pedal edema, cyanosis, or clubbing.  NEUROLOGIC: Cranial nerves II through XII are intact. Muscle strength 5/5 in all extremities. Sensation intact. Gait not checked.  PSYCHIATRIC: The  patient is alert and oriented x 3.  SKIN: No obvious rash, lesion, or ulcer.  DATA REVIEW:   CBC Recent Labs  Lab 07/10/19 0235  07/12/19 0601  WBC 13.7*  --   --   HGB 12.9*   < > 10.8*  HCT 38.5*  --   --   PLT 172  --   --    < > = values in this interval not displayed.    Chemistries  Recent Labs  Lab 07/09/19 2048 07/10/19 0235  NA 139 140  K 3.8 3.8  CL 106 113*  CO2 19* 18*  GLUCOSE 164* 126*  BUN 60* 54*  CREATININE 1.78* 1.50*  CALCIUM 10.2 9.2  AST 46*  --   ALT 33  --   ALKPHOS 50  --   BILITOT 1.7*  --      Microbiology Results  Results for orders placed or performed during the hospital encounter of 07/09/19  SARS CORONAVIRUS 2 Nasal Swab Aptima Multi Swab     Status: None   Collection Time: 07/09/19 10:45 PM   Specimen: Aptima Multi  Swab; Nasal Swab  Result Value Ref Range Status   SARS Coronavirus 2 NEGATIVE NEGATIVE Final    Comment: (NOTE) SARS-CoV-2 target nucleic acids are NOT DETECTED. The SARS-CoV-2 RNA is generally detectable in upper and lower respiratory specimens during the acute phase of infection. Negative results do not preclude SARS-CoV-2 infection, do not rule out co-infections with other pathogens, and should not be used as the sole basis for treatment or other patient management decisions. Negative results must be combined with clinical observations, patient history, and epidemiological information. The expected result is Negative. Fact Sheet for Patients: SugarRoll.be Fact Sheet for Healthcare Providers: https://www.woods-mathews.com/ This test is not yet approved or cleared by the [place] FDA and  has been authorized for detection and/or diagnosis of SARS-CoV-2 by FDA under an Emergency Use Authorization (EUA). This EUA will remain  in effect (meaning this test can be used) for the duration of the COVID-19 declaration under Section 56 4(b)(1) of the Act, 21 U.S.C. section  360bbb-3(b)(1), unless the authorization is terminated or revoked sooner. Performed at [facility], [place] [address] [place], [place] [address]     RADIOLOGY:  No results found.   Management plans discussed with the patient, family and they are in agreement.  CODE STATUS: Full Code   TOTAL TIME TAKING CARE OF THIS PATIENT: 32 minutes.    Demetrios Loll M.D on 07/12/2019 at 2:18 PM  Between 7am to 6pm - Pager - [phone]  After 6pm go to [email or web address] - Proofreader  [facility]  Office  [phone]  CC: Primary care physician; Juline Patch, MD   Note: This dictation was prepared with Dragon dictation along with smaller phrase technology. Any transcriptional errors that result from this process are unintentional.

## 2019-07-12 NOTE — OR Nursing (Signed)
Pt status post upper endoscopy. Healing esophageal ulcer. To begin a soft diet. Vitals stable, pt given a copy of a soft diet and given discharge instructions related to post op care.  Report given to [facility], primary RN

## 2019-07-12 NOTE — Op Note (Signed)
[facility]enter[facility] Patient Name: Luis Strong Procedure Date: 07/12/2019 7:33 AM MRN: [id number] Account #: [id number] Date of Birth: 30-Oct-1946 Admit Type: Outpatient Age: 73 Room: [facility] ENDO ROOM 4 Gender: Male Note Status: Finalized Procedure:            Upper GI endoscopy Indications:          Melena Providers:            [name] MD, MD Medicines:            Propofol per Anesthesia Complications:        No immediate complications. Procedure:            Pre-Anesthesia Assessment:                       - Prior to the procedure, a History and Physical was                        performed, and patient medications and allergies were                        reviewed. The patient's tolerance of previous                        anesthesia was also reviewed. The risks and benefits of                        the procedure and the sedation options and risks were                        discussed with the patient. All questions were                        answered, and informed consent was obtained. Prior                        Anticoagulants: The patient has taken anticoagulant                        medication, last dose was 3 days prior to procedure.                        ASA Grade Assessment: II - A patient with mild systemic                        disease. After reviewing the risks and benefits, the                        patient was deemed in satisfactory condition to undergo                        the procedure.                       After obtaining informed consent, the endoscope was                        passed under direct vision. Throughout the procedure,                        the patient's  blood pressure, pulse, and oxygen                        saturations were monitored continuously. The Endoscope                        was introduced through the mouth, and advanced to the                        second part of duodenum. The upper GI endoscopy  was                        accomplished without difficulty. The patient tolerated                        the procedure well. Findings:      One cratered esophageal ulcer with no bleeding and no stigmata of recent       bleeding was found at the gastroesophageal junction.      The stomach was normal.      The examined duodenum was normal. Impression:           - Non-bleeding esophageal ulcer.                       - Normal stomach.                       - Normal examined duodenum.                       - No specimens collected.                       - The ulcer may be the results of a mallory- weiss tear                        healing. Recommendation:       - Return patient to hospital ward for ongoing care.                       - Soft diet.                       - Continue present medications.                       - Use a proton pump inhibitor PO daily. Procedure Code(s):    --- Professional ---                       [phone], Esophagogastroduodenoscopy, flexible, transoral;                        diagnostic, including collection of specimen(s) by                        brushing or washing, when performed (separate procedure) Diagnosis Code(s):    --- Professional ---                       K92.1, Melena (includes Hematochezia)  K22.10, Ulcer of esophagus without bleeding CPT copyright 2019 American Medical Association. All rights reserved. The codes documented in this report are preliminary and upon coder review may  be revised to meet current compliance requirements. [name] MD, MD 07/12/2019 8:12:24 AM This report has been signed electronically. Number of Addenda: 0 Note Initiated On: 07/12/2019 7:33 AM Estimated Blood Loss: Estimated blood loss: none.      [facility]

## 2019-07-12 NOTE — Progress Notes (Signed)
[name] to be D/C'd home per MD order.  Discussed prescriptions and follow up appointments with the patient. Prescriptions given to patient, medication list explained in detail. Pt verbalized understanding.  Allergies as of 07/12/2019      Reactions   Capsaicin Itching, Rash   Severe rash and itching.      Medication List    STOP taking these medications   acyclovir 200 MG capsule Commonly known as: ZOVIRAX   aspirin 81 MG chewable tablet   clopidogrel 75 MG tablet Commonly known as: PLAVIX   simvastatin 40 MG tablet Commonly known as: ZOCOR     TAKE these medications   atorvastatin 80 MG tablet Commonly known as: LIPITOR Take 80 mg by mouth daily.   cetirizine 10 MG tablet Commonly known as: ZYRTEC Take 10 mg by mouth as needed for allergies.   citalopram 20 MG tablet Commonly known as: CELEXA TAKE 1 TABLET DAILY   Flaxseed Oil 1200 MG Caps Take by mouth daily.   omega-3 acid ethyl esters 1 g capsule Commonly known as: LOVAZA Take 2 g by mouth 2 (two) times daily.   OMEGA-3 FISH OIL PO Take 1,040 mg by mouth daily.   pantoprazole 40 MG tablet Commonly known as: PROTONIX Take 1 tablet (40 mg total) by mouth 2 (two) times daily before a meal.   PLANT STANOL ESTER PO Take 900 mg by mouth daily.   Resveratrol 250 MG Caps Take by mouth daily.   Toprol XL 25 MG 24 hr tablet Generic drug: metoprolol succinate TAKE 1 TABLET AT BEDTIME       Vitals:   07/12/19 0844 07/12/19 0917  BP: (!) 104/59 (!) 126/59  Pulse: 63 63  Resp: 10 12  Temp:  97.8 F (36.6 C)  SpO2: 99% 100%    Skin clean, dry and intact without evidence of skin break down, no evidence of skin tears noted. IV catheter discontinued intact. Site without signs and symptoms of complications. Dressing and pressure applied. Pt denies pain at this time. No complaints noted.  An After Visit Summary was printed and given to the patient. Patient escorted via [place], and D/C home via private  auto.  Chuck Hint RN [facility] [employer]

## 2019-07-12 NOTE — Anesthesia Post-op Follow-up Note (Signed)
Anesthesia QCDR form completed.        

## 2019-07-13 ENCOUNTER — Encounter: Payer: Self-pay | Admitting: Gastroenterology

## 2019-07-13 ENCOUNTER — Telehealth: Payer: Self-pay

## 2019-07-13 NOTE — Telephone Encounter (Signed)
Transition Care Management Follow-up Telephone Call  Date of discharge and from where: 07/12/19 [facility]  How have you been since you were released from the hospital? Pt c/o mild headache, taking antacid to prevent reflux and nausea  Any questions or concerns? No   Items Reviewed:  Did the pt receive and understand the discharge instructions provided? Yes   Medications obtained and verified? Yes   Any new allergies since your discharge? No   Dietary orders reviewed? Yes - soft diet  Do you have support at home? Yes   Functional Questionnaire: (I = Independent and D = Dependent) ADLs: I  Bathing/Dressing- I  Meal Prep- I  Eating- I  Maintaining continence- I  Transferring/Ambulation- I  Managing Meds- I  Follow up appointments reviewed:   PCP Hospital f/u appt confirmed? Yes  Scheduled to see Dr. Ronnald Ramp on 07/17/19 @ 3:00.  [facility] f/u appt confirmed? Yes  pt aware to call Dr. Bailey Mech Anna's office for follow up appt, phone number provided  Are transportation arrangements needed? No   If their condition worsens, is the pt aware to call PCP or go to the Emergency Dept.? Yes  Was the patient provided with contact information for the PCP's office or ED? Yes  Was to pt encouraged to call back with questions or concerns? Yes

## 2019-07-16 ENCOUNTER — Other Ambulatory Visit: Payer: Self-pay | Admitting: Family Medicine

## 2019-07-17 ENCOUNTER — Other Ambulatory Visit: Payer: Self-pay

## 2019-07-17 ENCOUNTER — Ambulatory Visit (INDEPENDENT_AMBULATORY_CARE_PROVIDER_SITE_OTHER): Payer: Medicare Other | Admitting: Family Medicine

## 2019-07-17 ENCOUNTER — Encounter: Payer: Self-pay | Admitting: Family Medicine

## 2019-07-17 VITALS — BP 120/70 | HR 60 | Ht 69.0 in | Wt 199.0 lb

## 2019-07-17 DIAGNOSIS — I2510 Atherosclerotic heart disease of native coronary artery without angina pectoris: Secondary | ICD-10-CM

## 2019-07-17 DIAGNOSIS — Z09 Encounter for follow-up examination after completed treatment for conditions other than malignant neoplasm: Secondary | ICD-10-CM | POA: Diagnosis not present

## 2019-07-17 DIAGNOSIS — R740 Nonspecific elevation of levels of transaminase and lactic acid dehydrogenase [LDH]: Secondary | ICD-10-CM

## 2019-07-17 DIAGNOSIS — D62 Acute posthemorrhagic anemia: Secondary | ICD-10-CM

## 2019-07-17 DIAGNOSIS — E782 Mixed hyperlipidemia: Secondary | ICD-10-CM

## 2019-07-17 DIAGNOSIS — Z23 Encounter for immunization: Secondary | ICD-10-CM

## 2019-07-17 DIAGNOSIS — R7989 Other specified abnormal findings of blood chemistry: Secondary | ICD-10-CM

## 2019-07-17 DIAGNOSIS — K922 Gastrointestinal hemorrhage, unspecified: Secondary | ICD-10-CM | POA: Diagnosis not present

## 2019-07-17 DIAGNOSIS — R7401 Elevation of levels of liver transaminase levels: Secondary | ICD-10-CM

## 2019-07-17 MED ORDER — ROSUVASTATIN CALCIUM 5 MG PO TABS: 5.0000 mg | ORAL_TABLET | Freq: Every day | ORAL | 1 refills | Status: DC
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
## 2019-07-18 LAB — CREATININE, SERUM
Creatinine, Ser: 1.4 mg/dL — ABNORMAL HIGH (ref 0.76–1.27)
GFR calc Af Amer: 57 mL/min/{1.73_m2} — ABNORMAL LOW (ref >59)
GFR calc non Af Amer: 49 mL/min/{1.73_m2} — ABNORMAL LOW (ref >59)

## 2019-07-18 LAB — AST: AST: 32 IU/L (ref 0–40)

## 2019-07-18 LAB — HEMOGLOBIN: Hemoglobin: 11.5 g/dL — ABNORMAL LOW (ref 13.0–17.7)

## 2019-07-21 ENCOUNTER — Other Ambulatory Visit: Payer: Self-pay | Admitting: Family Medicine

## 2019-07-21 DIAGNOSIS — I10 Essential (primary) hypertension: Secondary | ICD-10-CM

## 2019-08-03 ENCOUNTER — Other Ambulatory Visit: Payer: Self-pay

## 2019-08-03 DIAGNOSIS — K219 Gastro-esophageal reflux disease without esophagitis: Secondary | ICD-10-CM

## 2019-08-03 MED ORDER — PANTOPRAZOLE SODIUM 40 MG PO TBEC: 40.0000 mg | DELAYED_RELEASE_TABLET | Freq: Two times a day (BID) | ORAL | 0 refills | Status: DC

## 2019-08-04 DIAGNOSIS — E782 Mixed hyperlipidemia: Secondary | ICD-10-CM | POA: Diagnosis not present

## 2019-08-04 DIAGNOSIS — I63321 Cerebral infarction due to thrombosis of right anterior cerebral artery: Secondary | ICD-10-CM | POA: Diagnosis not present

## 2019-08-04 DIAGNOSIS — N183 Chronic kidney disease, stage 3 (moderate): Secondary | ICD-10-CM | POA: Diagnosis not present

## 2019-08-04 DIAGNOSIS — I10 Essential (primary) hypertension: Secondary | ICD-10-CM | POA: Diagnosis not present

## 2019-08-04 DIAGNOSIS — I2510 Atherosclerotic heart disease of native coronary artery without angina pectoris: Secondary | ICD-10-CM | POA: Diagnosis not present

## 2019-08-17 ENCOUNTER — Other Ambulatory Visit: Payer: Self-pay

## 2019-08-17 MED ORDER — ROSUVASTATIN CALCIUM 5 MG PO TABS: 5.0000 mg | ORAL_TABLET | Freq: Every day | ORAL | 0 refills | Status: DC

## 2019-08-24 ENCOUNTER — Ambulatory Visit: Payer: Medicare Other | Admitting: Gastroenterology

## 2019-08-24 DIAGNOSIS — R200 Anesthesia of skin: Secondary | ICD-10-CM | POA: Diagnosis not present

## 2019-08-24 DIAGNOSIS — R531 Weakness: Secondary | ICD-10-CM | POA: Diagnosis not present

## 2019-09-22 ENCOUNTER — Other Ambulatory Visit: Payer: Self-pay

## 2019-09-22 ENCOUNTER — Ambulatory Visit (INDEPENDENT_AMBULATORY_CARE_PROVIDER_SITE_OTHER): Payer: Medicare Other | Admitting: Gastroenterology

## 2019-09-22 VITALS — BP 107/59 | HR 74 | Temp 98.2°F | Ht 69.0 in | Wt 209.2 lb

## 2019-09-22 DIAGNOSIS — K289 Gastrojejunal ulcer, unspecified as acute or chronic, without hemorrhage or perforation: Secondary | ICD-10-CM

## 2019-09-22 DIAGNOSIS — Z8719 Personal history of other diseases of the digestive system: Secondary | ICD-10-CM

## 2019-09-22 DIAGNOSIS — I2510 Atherosclerotic heart disease of native coronary artery without angina pectoris: Secondary | ICD-10-CM | POA: Diagnosis not present

## 2019-09-22 NOTE — Progress Notes (Signed)
Jonathon Bellows MD, MRCP(U.K) [address]  [place]  [place], [place] [address]  Main: [phone]  Fax: [phone]   Primary Care Physician: Juline Patch, MD  Primary Gastroenterologist:  Dr. Harland German follow-up for GI bleed  HPI: Luis Strong is a 73 y.o. male   Summary of history :  He was admitted on 07/09/2019 with hematemesis.  I was consulted to see him.  He had been on Plavix.  For a stroke.  Admission he had an elevated BUN/creatinine ratio with a hemoglobin of 14.8 g.  He underwent an EGD on 07/12/2019, found to have a cratered ulcer at the GE junction likely Mallory-Weiss tear.  Subsequently discharged.  Interval history   07/12/2019-09/22/2019  Doing well since discharge.  Brown stools.  Taking Protonix.  Plavix has been stopped.  On aspirin.  No abdominal pain  Current Outpatient Medications  Medication Sig Dispense Refill  . cetirizine (ZYRTEC) 10 MG tablet Take 10 mg by mouth as needed for allergies.    . citalopram (CELEXA) 20 MG tablet TAKE 1 TABLET DAILY 90 tablet 0  . Flaxseed, Linseed, (FLAXSEED OIL) 1200 MG CAPS Take by mouth daily.    . Omega-3 Fatty Acids (OMEGA-3 FISH OIL PO) Take 1,040 mg by mouth daily.    . pantoprazole (PROTONIX) 40 MG tablet Take 1 tablet (40 mg total) by mouth 2 (two) times daily before a meal. 180 tablet 0  . PLANT STANOL ESTER PO Take 900 mg by mouth daily.    . rosuvastatin (CRESTOR) 5 MG tablet Take 1 tablet (5 mg total) by mouth daily. 90 tablet 0  . TOPROL XL 25 MG 24 hr tablet TAKE 1 TABLET AT BEDTIME 90 tablet 1   No current facility-administered medications for this visit.     Allergies as of 09/22/2019 - Review Complete 07/17/2019  Allergen Reaction Noted  . Capsaicin Itching and Rash 11/15/2015    ROS:  General: Negative for anorexia, weight loss, fever, chills, fatigue, weakness. ENT: Negative for hoarseness, difficulty swallowing , nasal congestion. CV: Negative for chest pain, angina,  palpitations, dyspnea on exertion, peripheral edema.  Respiratory: Negative for dyspnea at rest, dyspnea on exertion, cough, sputum, wheezing.  GI: See history of present illness. GU:  Negative for dysuria, hematuria, urinary incontinence, urinary frequency, nocturnal urination.  Endo: Negative for unusual weight change.    Physical Examination:   There were no vitals taken for this visit.  General: Well-nourished, well-developed in no acute distress.  Eyes: No icterus. Conjunctivae pink. Mouth: Oropharyngeal mucosa moist and pink , no lesions erythema or exudate. Lungs: Clear to auscultation bilaterally. Non-labored. Heart: Regular rate and rhythm, no murmurs rubs or gallops.  Abdomen: Bowel sounds are normal, nontender, nondistended, no hepatosplenomegaly or masses, no abdominal bruits or hernia , no rebound or guarding.   Extremities: No lower extremity edema. No clubbing or deformities. Neuro: Alert and oriented x 3.  Grossly intact. Skin: Warm and dry, no jaundice.   Psych: Alert and cooperative, normal mood and affect.   Imaging Studies: No results found.  Assessment and Plan:   Luis Strong is a 73 y.o. y/o male admitted with an upper GI bleed in 07/09/2019 and an EGD was found to have an ulcer at the GE junction likely a Mallory-Weiss tear.  Plan 1.  CBC 2.  Repeat EGD to check for healing of the ulcer at the GE junction.     I have discussed alternative options, risks &  benefits,  which include, but are not limited to, bleeding, infection, perforation,respiratory complication & drug reaction.  The patient agrees with this plan & written consent will be obtained.       Dr Jonathon Bellows  MD,MRCP [facility]) Follow up in as needed

## 2019-09-23 LAB — CBC
Hematocrit: 38.5 % (ref 37.5–51.0)
Hemoglobin: 12.3 g/dL — ABNORMAL LOW (ref 13.0–17.7)
MCH: 25.3 pg — ABNORMAL LOW (ref 26.6–33.0)
MCHC: 31.9 g/dL (ref 31.5–35.7)
MCV: 79 fL (ref 79–97)
Platelets: 196 10*3/uL (ref 150–450)
RBC: 4.87 x10E6/uL (ref 4.14–5.80)
RDW: 14.1 % (ref 11.6–15.4)
WBC: 7.2 10*3/uL (ref 3.4–10.8)

## 2019-09-28 ENCOUNTER — Other Ambulatory Visit: Payer: Self-pay

## 2019-09-28 ENCOUNTER — Other Ambulatory Visit
Admission: RE | Admit: 2019-09-28 | Discharge: 2019-09-28 | Disposition: A | Discharge status: Home / Self Care | Payer: Medicare Other | Source: Ambulatory Visit | Attending: Gastroenterology | Admitting: Gastroenterology

## 2019-09-28 DIAGNOSIS — Z20828 Contact with and (suspected) exposure to other viral communicable diseases: Secondary | ICD-10-CM | POA: Diagnosis not present

## 2019-09-28 DIAGNOSIS — Z01812 Encounter for preprocedural laboratory examination: Secondary | ICD-10-CM | POA: Diagnosis not present

## 2019-09-28 LAB — SARS CORONAVIRUS 2 (TAT 6-24 HRS): SARS Coronavirus 2: NEGATIVE

## 2019-09-29 ENCOUNTER — Encounter: Payer: Self-pay | Admitting: Gastroenterology

## 2019-09-29 ENCOUNTER — Other Ambulatory Visit: Payer: Self-pay | Admitting: Family Medicine

## 2019-10-01 ENCOUNTER — Ambulatory Visit: Payer: Medicare Other | Admitting: Anesthesiology

## 2019-10-01 ENCOUNTER — Other Ambulatory Visit: Payer: Self-pay

## 2019-10-01 ENCOUNTER — Encounter
Admission: RE | Disposition: A | Discharge status: Home / Self Care | Payer: Self-pay | Source: Home / Self Care | Attending: Gastroenterology

## 2019-10-01 ENCOUNTER — Encounter: Payer: Self-pay | Admitting: Anesthesiology

## 2019-10-01 ENCOUNTER — Ambulatory Visit
Admission: RE | Admit: 2019-10-01 | Discharge: 2019-10-01 | Disposition: A | Discharge status: Home / Self Care | Payer: Medicare Other | Attending: Gastroenterology | Admitting: Gastroenterology

## 2019-10-01 DIAGNOSIS — Z79899 Other long term (current) drug therapy: Secondary | ICD-10-CM | POA: Diagnosis not present

## 2019-10-01 DIAGNOSIS — Z7982 Long term (current) use of aspirin: Secondary | ICD-10-CM | POA: Insufficient documentation

## 2019-10-01 DIAGNOSIS — E782 Mixed hyperlipidemia: Secondary | ICD-10-CM | POA: Insufficient documentation

## 2019-10-01 DIAGNOSIS — I2510 Atherosclerotic heart disease of native coronary artery without angina pectoris: Secondary | ICD-10-CM | POA: Insufficient documentation

## 2019-10-01 DIAGNOSIS — E785 Hyperlipidemia, unspecified: Secondary | ICD-10-CM | POA: Insufficient documentation

## 2019-10-01 DIAGNOSIS — F329 Major depressive disorder, single episode, unspecified: Secondary | ICD-10-CM | POA: Diagnosis not present

## 2019-10-01 DIAGNOSIS — N183 Chronic kidney disease, stage 3 unspecified: Secondary | ICD-10-CM | POA: Diagnosis not present

## 2019-10-01 DIAGNOSIS — I10 Essential (primary) hypertension: Secondary | ICD-10-CM | POA: Diagnosis not present

## 2019-10-01 DIAGNOSIS — K2210 Ulcer of esophagus without bleeding: Secondary | ICD-10-CM | POA: Diagnosis not present

## 2019-10-01 DIAGNOSIS — K219 Gastro-esophageal reflux disease without esophagitis: Secondary | ICD-10-CM | POA: Insufficient documentation

## 2019-10-01 DIAGNOSIS — Z8719 Personal history of other diseases of the digestive system: Secondary | ICD-10-CM | POA: Diagnosis not present

## 2019-10-01 DIAGNOSIS — K289 Gastrojejunal ulcer, unspecified as acute or chronic, without hemorrhage or perforation: Secondary | ICD-10-CM | POA: Diagnosis not present

## 2019-10-01 DIAGNOSIS — I129 Hypertensive chronic kidney disease with stage 1 through stage 4 chronic kidney disease, or unspecified chronic kidney disease: Secondary | ICD-10-CM | POA: Diagnosis not present

## 2019-10-01 HISTORY — PX: ESOPHAGOGASTRODUODENOSCOPY (EGD) WITH PROPOFOL: SHX5813

## 2019-10-01 SURGERY — ESOPHAGOGASTRODUODENOSCOPY (EGD) WITH PROPOFOL
Anesthesia: General

## 2019-10-01 MED ORDER — GLYCOPYRROLATE 0.2 MG/ML IJ SOLN
INTRAMUSCULAR | Status: DC | PRN
  Administered 2019-10-01: 0.2 mg via INTRAVENOUS

## 2019-10-01 MED ORDER — LIDOCAINE HCL (CARDIAC) PF 100 MG/5ML IV SOSY
PREFILLED_SYRINGE | INTRAVENOUS | Status: DC | PRN
  Administered 2019-10-01: 60 mg via INTRAVENOUS

## 2019-10-01 MED ORDER — PROPOFOL 10 MG/ML IV BOLUS
INTRAVENOUS | Status: DC | PRN
  Administered 2019-10-01: 70 mg via INTRAVENOUS

## 2019-10-01 MED ORDER — PROPOFOL 500 MG/50ML IV EMUL
INTRAVENOUS | Status: DC | PRN
  Administered 2019-10-01: 140 ug/kg/min via INTRAVENOUS

## 2019-10-01 MED ORDER — SODIUM CHLORIDE 0.9 % IV SOLN
INTRAVENOUS | Status: DC
  Administered 2019-10-01: 09:00:00 via INTRAVENOUS

## 2019-10-01 NOTE — Anesthesia Post-op Follow-up Note (Signed)
Anesthesia QCDR form completed.        

## 2019-10-01 NOTE — Op Note (Signed)
[facility] Patient Name: Luis Strong Procedure Date: 10/01/2019 8:43 AM MRN: [id number] Account #: [id number] Date of Birth: October 03, 1946 Admit Type: Outpatient Age: 73 Room: [facility] ENDO ROOM 4 Gender: Male Note Status: Finalized Procedure:             Upper GI endoscopy Indications:           Follow-up of esophageal ulcer Providers:             Jonathon Bellows MD, MD Referring MD:          Juline Patch, MD (Referring MD) Medicines:             Monitored Anesthesia Care Complications:         No immediate complications. Procedure:             Pre-Anesthesia Assessment:                        - Prior to the procedure, a History and Physical was                         performed, and patient medications, allergies and                         sensitivities were reviewed. The patient's tolerance                         of previous anesthesia was reviewed.                        - The risks and benefits of the procedure and the                         sedation options and risks were discussed with the                         patient. All questions were answered and informed                         consent was obtained.                        - ASA Grade Assessment: II - A patient with mild                         systemic disease.                        After obtaining informed consent, the endoscope was                         passed under direct vision. Throughout the procedure,                         the patient's blood pressure, pulse, and oxygen                         saturations were monitored continuously. The Endoscope                         was introduced through the  mouth, and advanced to the                         third part of duodenum. The upper GI endoscopy was                         accomplished with ease. The patient tolerated the                         procedure well. Findings:      The esophagus was normal.      The stomach  was normal.      The examined duodenum was normal. Impression:            - Normal esophagus.                        - Normal stomach.                        - Normal examined duodenum.                        - No specimens collected. Recommendation:        - Discharge patient to home (with escort).                        - Resume previous diet.                        - Continue present medications. Procedure Code(s):     --- Professional ---                        [phone], Esophagogastroduodenoscopy, flexible,                         transoral; diagnostic, including collection of                         specimen(s) by brushing or washing, when performed                         (separate procedure) Diagnosis Code(s):     --- Professional ---                        K22.10, Ulcer of esophagus without bleeding CPT copyright 2019 American Medical Association. All rights reserved. The codes documented in this report are preliminary and upon coder review may  be revised to meet current compliance requirements. Jonathon Bellows, MD Jonathon Bellows MD, MD 10/01/2019 8:53:44 AM This report has been signed electronically. Number of Addenda: 0 Note Initiated On: 10/01/2019 8:43 AM Estimated Blood Loss:  Estimated blood loss: none.      [facility]
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
## 2019-10-01 NOTE — Transfer of Care (Signed)
Immediate Anesthesia Transfer of Care Note  Patient: Luis Strong  Procedure(s) Performed: ESOPHAGOGASTRODUODENOSCOPY (EGD) WITH PROPOFOL (N/A )  Patient Location: PACU  Anesthesia Type:General  Level of Consciousness: sedated  Airway & Oxygen Therapy: Patient Spontanous Breathing and Patient connected to nasal cannula oxygen  Post-op Assessment: Report given to RN and Post -op Vital signs reviewed and stable  Post vital signs: Reviewed and stable  Last Vitals:  Vitals Value Taken Time  BP 110/71 10/01/19 0855  Temp 36.4 C 10/01/19 0855  Pulse 91 10/01/19 0855  Resp 22 10/01/19 0855  SpO2 91 % 10/01/19 0855  Vitals shown include unvalidated device data.  Last Pain:  Vitals:   10/01/19 0854  TempSrc: Temporal  PainSc: Asleep         Complications: No apparent anesthesia complications

## 2019-10-01 NOTE — Anesthesia Preprocedure Evaluation (Signed)
Anesthesia Evaluation  Patient identified by MRN, date of birth, ID band Patient awake    Reviewed: Allergy & Precautions, NPO status , Patient's Chart, lab work & pertinent test results, reviewed documented beta blocker date and time   History of Anesthesia Complications History of anesthetic complications: Headache after stent 2015.  Airway Mallampati: III  TM Distance: >3 FB Neck ROM: Full    Dental no notable dental hx.    Pulmonary sleep apnea ,    Pulmonary exam normal breath sounds clear to auscultation       Cardiovascular hypertension, + CAD (s/p LAD stent 2015)  Normal cardiovascular exam Rhythm:Regular Rate:Normal     Neuro/Psych PSYCHIATRIC DISORDERS Depression negative neurological ROS     GI/Hepatic Neg liver ROS, GERD  ,  Endo/Other  negative endocrine ROS  Renal/GU Renal InsufficiencyRenal disease     Musculoskeletal negative musculoskeletal ROS (+)   Abdominal Normal abdominal exam  (+)  Abdomen: soft.    Peds negative pediatric ROS (+)  Hematology negative hematology ROS (+)   Anesthesia Other Findings Past Medical History: 11/15/2015: Abnormal cardiovascular stress test 11/16/2014: Arteriosclerosis of coronary artery     Comment:  Overview:  Sp pci stetn of lad 2015  11/16/2014: Benign essential HTN 2001: Cancer ([place])     Comment:  skin cancer on face 11/03/2014: Chest pain 11/15/2015: Combined fat and carbohydrate induced hyperlipemia [id number]: Complication of anesthesia     Comment:  severe head ache with cardiac stent placement. Refered               to neurologist. No date: Depression No date: GERD (gastroesophageal reflux disease) No date: Hyperlipidemia No date: Hypertension No date: Kidney stones     Comment:  x 20 years 04/20/2014: MI (mitral incompetence)  Reproductive/Obstetrics                             Anesthesia Physical  Anesthesia  Plan  ASA: III  Anesthesia Plan: General   Post-op Pain Management:    Induction: Intravenous  PONV Risk Score and Plan: Propofol infusion  Airway Management Planned: Nasal Cannula  Additional Equipment:   Intra-op Plan:   Post-operative Plan:   Informed Consent: I have reviewed the patients History and Physical, chart, labs and discussed the procedure including the risks, benefits and alternatives for the proposed anesthesia with the patient or authorized representative who has indicated his/her understanding and acceptance.       Plan Discussed with: CRNA, Surgeon and Anesthesiologist  Anesthesia Plan Comments:         Anesthesia Quick Evaluation

## 2019-10-02 ENCOUNTER — Encounter: Payer: Self-pay | Admitting: Gastroenterology

## 2019-10-05 NOTE — Anesthesia Postprocedure Evaluation (Signed)
Anesthesia Post Note  Patient: Luis Strong  Procedure(s) Performed: ESOPHAGOGASTRODUODENOSCOPY (EGD) WITH PROPOFOL (N/A )  Patient location during evaluation: Endoscopy Anesthesia Type: General Level of consciousness: awake and alert and oriented Pain management: pain level controlled Vital Signs Assessment: post-procedure vital signs reviewed and stable Respiratory status: spontaneous breathing Cardiovascular status: blood pressure returned to baseline Anesthetic complications: no     Last Vitals:  Vitals:   10/01/19 0914 10/01/19 0924  BP:  116/66  Pulse:    Resp: 17   Temp:    SpO2:      Last Pain:  Vitals:   10/02/19 0758  TempSrc:   PainSc: 0-No pain                 Nicolemarie Wooley

## 2019-10-16 ENCOUNTER — Other Ambulatory Visit: Payer: Self-pay | Admitting: Family Medicine

## 2019-10-16 DIAGNOSIS — K219 Gastro-esophageal reflux disease without esophagitis: Secondary | ICD-10-CM

## 2019-11-05 ENCOUNTER — Other Ambulatory Visit: Payer: Self-pay

## 2019-11-05 MED ORDER — ROSUVASTATIN CALCIUM 10 MG PO TABS: 10.0000 mg | ORAL_TABLET | Freq: Every day | ORAL | Status: DC

## 2019-12-01 DIAGNOSIS — E782 Mixed hyperlipidemia: Secondary | ICD-10-CM | POA: Diagnosis not present

## 2019-12-01 DIAGNOSIS — I10 Essential (primary) hypertension: Secondary | ICD-10-CM | POA: Diagnosis not present

## 2019-12-01 DIAGNOSIS — I2510 Atherosclerotic heart disease of native coronary artery without angina pectoris: Secondary | ICD-10-CM | POA: Diagnosis not present

## 2019-12-01 DIAGNOSIS — I340 Nonrheumatic mitral (valve) insufficiency: Secondary | ICD-10-CM | POA: Diagnosis not present

## 2019-12-21 ENCOUNTER — Other Ambulatory Visit: Payer: Self-pay

## 2019-12-21 ENCOUNTER — Ambulatory Visit (INDEPENDENT_AMBULATORY_CARE_PROVIDER_SITE_OTHER): Payer: Medicare Other

## 2019-12-21 VITALS — BP 122/78 | HR 67 | Resp 16 | Ht 69.0 in | Wt 213.4 lb

## 2019-12-21 DIAGNOSIS — Z0000 Encounter for general adult medical examination without abnormal findings: Secondary | ICD-10-CM
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
## 2019-12-21 NOTE — Patient Instructions (Signed)
Luis Strong , Thank you for taking time to come for your Medicare Wellness Visit. I appreciate your ongoing commitment to your health goals. Please review the following plan we discussed and let me know if I can assist you in the future.   Screening recommendations/referrals: Colonoscopy: done 12/05/17 Recommended yearly ophthalmology/optometry visit for glaucoma screening and checkup Recommended yearly dental visit for hygiene and checkup  Vaccinations: Influenza vaccine: done 07/17/19 Pneumococcal vaccine: done 07/02/17 Tdap vaccine: due Shingles vaccine: Shingrix completed     Advanced directives: Please bring a copy of your health care power of attorney and living will to the office at your convenience.  Conditions/risks identified: Recommend healthy eating and physical activity for desired weight loss  Next appointment: Please follow up in one year for your Medicare Annual Wellness visit.    Preventive Care 33 Years and Older, Male Preventive care refers to lifestyle choices and visits with your health care provider that can promote health and wellness. What does preventive care include?  A yearly physical exam. This is also called an annual well check.  Dental exams once or twice a year.  Routine eye exams. Ask your health care provider how often you should have your eyes checked.  Personal lifestyle choices, including:  Daily care of your teeth and gums.  Regular physical activity.  Eating a healthy diet.  Avoiding tobacco and drug use.  Limiting alcohol use.  Practicing safe sex.  Taking low doses of aspirin every day.  Taking vitamin and mineral supplements as recommended by your health care provider. What happens during an annual well check? The services and screenings done by your health care provider during your annual well check will depend on your age, overall health, lifestyle risk factors, and family history of disease. Counseling  Your health care  provider may ask you questions about your:  Alcohol use.  Tobacco use.  Drug use.  Emotional well-being.  Home and relationship well-being.  Sexual activity.  Eating habits.  History of falls.  Memory and ability to understand (cognition).  Work and work Statistician. Screening  You may have the following tests or measurements:  Height, weight, and BMI.  Blood pressure.  Lipid and cholesterol levels. These may be checked every 5 years, or more frequently if you are over 62 years old.  Skin check.  Lung cancer screening. You may have this screening every year starting at age 23 if you have a 30-pack-year history of smoking and currently smoke or have quit within the past 15 years.  Fecal occult blood test (FOBT) of the stool. You may have this test every year starting at age 60.  Flexible sigmoidoscopy or colonoscopy. You may have a sigmoidoscopy every 5 years or a colonoscopy every 10 years starting at age 15.  Prostate cancer screening. Recommendations will vary depending on your family history and other risks.  Hepatitis C blood test.  Hepatitis B blood test.  Sexually transmitted disease (STD) testing.  Diabetes screening. This is done by checking your blood sugar (glucose) after you have not eaten for a while (fasting). You may have this done every 1-3 years.  Abdominal aortic aneurysm (AAA) screening. You may need this if you are a current or former smoker.  Osteoporosis. You may be screened starting at age 51 if you are at high risk. Talk with your health care provider about your test results, treatment options, and if necessary, the need for more tests. Vaccines  Your health care provider may recommend certain vaccines,  such as:  Influenza vaccine. This is recommended every year.  Tetanus, diphtheria, and acellular pertussis (Tdap, Td) vaccine. You may need a Td booster every 10 years.  Zoster vaccine. You may need this after age 41.  Pneumococcal  13-valent conjugate (PCV13) vaccine. One dose is recommended after age 14.  Pneumococcal polysaccharide (PPSV23) vaccine. One dose is recommended after age 11. Talk to your health care provider about which screenings and vaccines you need and how often you need them. This information is not intended to replace advice given to you by your health care provider. Make sure you discuss any questions you have with your health care provider. Document Released: 12/02/2015 Document Revised: 07/25/2016 Document Reviewed: 09/06/2015 Elsevier Interactive Patient Education  2017 [place] Prevention in the Home Falls can cause injuries. They can happen to people of all ages. There are many things you can do to make your home safe and to help prevent falls. What can I do on the outside of my home?  Regularly fix the edges of walkways and driveways and fix any cracks.  Remove anything that might make you trip as you walk through a door, such as a raised step or threshold.  Trim any bushes or trees on the path to your home.  Use bright outdoor lighting.  Clear any walking paths of anything that might make someone trip, such as rocks or tools.  Regularly check to see if handrails are loose or broken. Make sure that both sides of any steps have handrails.  Any raised decks and porches should have guardrails on the edges.  Have any leaves, snow, or ice cleared regularly.  Use sand or salt on walking paths during winter.  Clean up any spills in your garage right away. This includes oil or grease spills. What can I do in the bathroom?  Use night lights.  Install grab bars by the toilet and in the tub and shower. Do not use towel bars as grab bars.  Use non-skid mats or decals in the tub or shower.  If you need to sit down in the shower, use a plastic, non-slip stool.  Keep the floor dry. Clean up any water that spills on the floor as soon as it happens.  Remove soap buildup in the  tub or shower regularly.  Attach bath mats securely with double-sided non-slip rug tape.  Do not have throw rugs and other things on the floor that can make you trip. What can I do in the bedroom?  Use night lights.  Make sure that you have a light by your bed that is easy to reach.  Do not use any sheets or blankets that are too big for your bed. They should not hang down onto the floor.  Have a firm chair that has side arms. You can use this for support while you get dressed.  Do not have throw rugs and other things on the floor that can make you trip. What can I do in the kitchen?  Clean up any spills right away.  Avoid walking on wet floors.  Keep items that you use a lot in easy-to-reach places.  If you need to reach something above you, use a strong step stool that has a grab bar.  Keep electrical cords out of the way.  Do not use floor polish or wax that makes floors slippery. If you must use wax, use non-skid floor wax.  Do not have throw rugs and other things on  the floor that can make you trip. What can I do with my stairs?  Do not leave any items on the stairs.  Make sure that there are handrails on both sides of the stairs and use them. Fix handrails that are broken or loose. Make sure that handrails are as long as the stairways.  Check any carpeting to make sure that it is firmly attached to the stairs. Fix any carpet that is loose or worn.  Avoid having throw rugs at the top or bottom of the stairs. If you do have throw rugs, attach them to the floor with carpet tape.  Make sure that you have a light switch at the top of the stairs and the bottom of the stairs. If you do not have them, ask someone to add them for you. What else can I do to help prevent falls?  Wear shoes that:  Do not have high heels.  Have rubber bottoms.  Are comfortable and fit you well.  Are closed at the toe. Do not wear sandals.  If you use a stepladder:  Make sure that it  is fully opened. Do not climb a closed stepladder.  Make sure that both sides of the stepladder are locked into place.  Ask someone to hold it for you, if possible.  Clearly mark and make sure that you can see:  Any grab bars or handrails.  First and last steps.  Where the edge of each step is.  Use tools that help you move around (mobility aids) if they are needed. These include:  Canes.  Walkers.  Scooters.  Crutches.  Turn on the lights when you go into a dark area. Replace any light bulbs as soon as they burn out.  Set up your furniture so you have a clear path. Avoid moving your furniture around.  If any of your floors are uneven, fix them.  If there are any pets around you, be aware of where they are.  Review your medicines with your doctor. Some medicines can make you feel dizzy. This can increase your chance of falling. Ask your doctor what other things that you can do to help prevent falls. This information is not intended to replace advice given to you by your health care provider. Make sure you discuss any questions you have with your health care provider. Document Released: 09/01/2009 Document Revised: 04/12/2016 Document Reviewed: 12/10/2014 Elsevier Interactive Patient Education  2017 [employer].

## 2019-12-22 ENCOUNTER — Other Ambulatory Visit: Payer: Self-pay | Admitting: Family Medicine

## 2019-12-22 DIAGNOSIS — I10 Essential (primary) hypertension: Secondary | ICD-10-CM

## 2019-12-29 ENCOUNTER — Encounter: Payer: Self-pay | Admitting: Family Medicine

## 2019-12-29 ENCOUNTER — Ambulatory Visit (INDEPENDENT_AMBULATORY_CARE_PROVIDER_SITE_OTHER): Payer: Medicare Other | Admitting: Family Medicine

## 2019-12-29 ENCOUNTER — Other Ambulatory Visit: Payer: Self-pay

## 2019-12-29 VITALS — BP 120/80 | HR 64 | Ht 69.0 in | Wt 210.0 lb

## 2019-12-29 DIAGNOSIS — I10 Essential (primary) hypertension: Secondary | ICD-10-CM | POA: Diagnosis not present

## 2019-12-29 DIAGNOSIS — K219 Gastro-esophageal reflux disease without esophagitis: Secondary | ICD-10-CM | POA: Diagnosis not present

## 2019-12-29 DIAGNOSIS — E781 Pure hyperglyceridemia: Secondary | ICD-10-CM

## 2019-12-29 DIAGNOSIS — F4323 Adjustment disorder with mixed anxiety and depressed mood: Secondary | ICD-10-CM

## 2019-12-29 MED ORDER — CITALOPRAM HYDROBROMIDE 40 MG PO TABS: 40.0000 mg | ORAL_TABLET | Freq: Every day | ORAL | 1 refills | Status: DC

## 2019-12-29 MED ORDER — ROSUVASTATIN CALCIUM 10 MG PO TABS: 10.0000 mg | ORAL_TABLET | Freq: Every day | ORAL | 1 refills | Status: DC

## 2019-12-29 MED ORDER — METOPROLOL SUCCINATE ER 25 MG PO TB24: 25.0000 mg | ORAL_TABLET | Freq: Every day | ORAL | 1 refills | Status: DC

## 2019-12-29 MED ORDER — PANTOPRAZOLE SODIUM 40 MG PO TBEC: DELAYED_RELEASE_TABLET | ORAL | 1 refills | Status: DC
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
## 2019-12-30 LAB — COMPREHENSIVE METABOLIC PANEL
ALT: 27 IU/L (ref 0–44)
AST: 30 IU/L (ref 0–40)
Albumin/Globulin Ratio: 1.9 (ref 1.2–2.2)
Albumin: 4.6 g/dL (ref 3.7–4.7)
Alkaline Phosphatase: 50 IU/L (ref 39–117)
BUN/Creatinine Ratio: 10 (ref 10–24)
BUN: 18 mg/dL (ref 8–27)
Bilirubin Total: 1.3 mg/dL — ABNORMAL HIGH (ref 0.0–1.2)
CO2: 15 mmol/L — ABNORMAL LOW (ref 20–29)
Calcium: 9.9 mg/dL (ref 8.6–10.2)
Chloride: 104 mmol/L (ref 96–106)
Creatinine, Ser: 1.8 mg/dL — ABNORMAL HIGH (ref 0.76–1.27)
GFR calc Af Amer: 42 mL/min/{1.73_m2} — ABNORMAL LOW (ref >59)
GFR calc non Af Amer: 36 mL/min/{1.73_m2} — ABNORMAL LOW (ref >59)
Globulin, Total: 2.4 g/dL (ref 1.5–4.5)
Glucose: 88 mg/dL (ref 65–99)
Potassium: 4.6 mmol/L (ref 3.5–5.2)
Sodium: 139 mmol/L (ref 134–144)
Total Protein: 7 g/dL (ref 6.0–8.5)

## 2019-12-30 LAB — LIPID PANEL WITH LDL/HDL RATIO
Cholesterol, Total: 160 mg/dL (ref 100–199)
HDL: 40 mg/dL (ref >39)
LDL Chol Calc (NIH): 90 mg/dL (ref 0–99)
LDL/HDL Ratio: 2.3 ratio (ref 0.0–3.6)
Triglycerides: 173 mg/dL — ABNORMAL HIGH (ref 0–149)
VLDL Cholesterol Cal: 30 mg/dL (ref 5–40)

## 2019-12-31 ENCOUNTER — Other Ambulatory Visit: Payer: Self-pay

## 2019-12-31 DIAGNOSIS — R7989 Other specified abnormal findings of blood chemistry: Secondary | ICD-10-CM

## 2019-12-31 NOTE — Progress Notes (Unsigned)
Ref to neph placed

## 2020-02-04 ENCOUNTER — Other Ambulatory Visit: Payer: Self-pay | Admitting: Nephrology

## 2020-02-04 DIAGNOSIS — I10 Essential (primary) hypertension: Secondary | ICD-10-CM | POA: Diagnosis not present

## 2020-02-04 DIAGNOSIS — N1832 Chronic kidney disease, stage 3b: Secondary | ICD-10-CM | POA: Diagnosis not present

## 2020-02-09 ENCOUNTER — Ambulatory Visit
Admission: RE | Admit: 2020-02-09 | Discharge: 2020-02-09 | Disposition: A | Discharge status: Home / Self Care | Payer: Medicare Other | Source: Ambulatory Visit | Attending: Nephrology | Admitting: Nephrology

## 2020-02-09 ENCOUNTER — Other Ambulatory Visit: Payer: Self-pay

## 2020-02-09 DIAGNOSIS — N1832 Chronic kidney disease, stage 3b: Secondary | ICD-10-CM | POA: Diagnosis not present

## 2020-02-09 DIAGNOSIS — N200 Calculus of kidney: Secondary | ICD-10-CM | POA: Diagnosis not present

## 2020-02-09 DIAGNOSIS — N183 Chronic kidney disease, stage 3 unspecified: Secondary | ICD-10-CM | POA: Diagnosis not present

## 2020-03-03 DIAGNOSIS — N1832 Chronic kidney disease, stage 3b: Secondary | ICD-10-CM | POA: Diagnosis not present

## 2020-03-03 DIAGNOSIS — N281 Cyst of kidney, acquired: Secondary | ICD-10-CM | POA: Diagnosis not present

## 2020-03-03 DIAGNOSIS — I10 Essential (primary) hypertension: Secondary | ICD-10-CM | POA: Diagnosis not present

## 2020-03-14 ENCOUNTER — Telehealth: Payer: Self-pay | Admitting: Family Medicine

## 2020-03-14 NOTE — Telephone Encounter (Signed)
-   Legrand Como with [employer] called in to confirm if Cardio vascular risk assessment form has been received? He would like an update on return of form   CB: [phone]

## 2020-03-15 NOTE — Telephone Encounter (Signed)
Pt. Not interested

## 2020-04-20 DIAGNOSIS — L578 Other skin changes due to chronic exposure to nonionizing radiation: Secondary | ICD-10-CM | POA: Diagnosis not present

## 2020-04-20 DIAGNOSIS — L570 Actinic keratosis: Secondary | ICD-10-CM | POA: Diagnosis not present

## 2020-04-20 DIAGNOSIS — Z872 Personal history of diseases of the skin and subcutaneous tissue: Secondary | ICD-10-CM | POA: Diagnosis not present

## 2020-04-20 DIAGNOSIS — Z85828 Personal history of other malignant neoplasm of skin: Secondary | ICD-10-CM | POA: Diagnosis not present

## 2020-04-20 DIAGNOSIS — L853 Xerosis cutis: Secondary | ICD-10-CM | POA: Diagnosis not present

## 2020-05-15 ENCOUNTER — Other Ambulatory Visit: Payer: Self-pay | Admitting: Family Medicine

## 2020-05-24 ENCOUNTER — Other Ambulatory Visit: Payer: Self-pay | Admitting: Family Medicine

## 2020-05-24 DIAGNOSIS — F4323 Adjustment disorder with mixed anxiety and depressed mood: Secondary | ICD-10-CM

## 2020-06-01 DIAGNOSIS — I340 Nonrheumatic mitral (valve) insufficiency: Secondary | ICD-10-CM | POA: Diagnosis not present

## 2020-06-01 DIAGNOSIS — E782 Mixed hyperlipidemia: Secondary | ICD-10-CM | POA: Diagnosis not present

## 2020-06-01 DIAGNOSIS — I2510 Atherosclerotic heart disease of native coronary artery without angina pectoris: Secondary | ICD-10-CM | POA: Diagnosis not present

## 2020-06-01 DIAGNOSIS — I63321 Cerebral infarction due to thrombosis of right anterior cerebral artery: Secondary | ICD-10-CM | POA: Diagnosis not present

## 2020-06-01 DIAGNOSIS — N1831 Chronic kidney disease, stage 3a: Secondary | ICD-10-CM | POA: Diagnosis not present

## 2020-06-01 DIAGNOSIS — I10 Essential (primary) hypertension: Secondary | ICD-10-CM | POA: Diagnosis not present

## 2020-07-07 DIAGNOSIS — I10 Essential (primary) hypertension: Secondary | ICD-10-CM | POA: Diagnosis not present

## 2020-07-07 DIAGNOSIS — N1832 Chronic kidney disease, stage 3b: Secondary | ICD-10-CM | POA: Diagnosis not present

## 2020-07-07 DIAGNOSIS — N281 Cyst of kidney, acquired: Secondary | ICD-10-CM | POA: Diagnosis not present

## 2020-07-22 ENCOUNTER — Other Ambulatory Visit: Payer: Self-pay

## 2020-07-22 ENCOUNTER — Encounter: Payer: Self-pay | Admitting: Urology

## 2020-07-22 ENCOUNTER — Ambulatory Visit (INDEPENDENT_AMBULATORY_CARE_PROVIDER_SITE_OTHER): Payer: Medicare Other | Admitting: Urology

## 2020-07-22 VITALS — BP 148/80 | HR 60 | Ht 69.0 in | Wt 210.6 lb

## 2020-07-22 DIAGNOSIS — N200 Calculus of kidney: Secondary | ICD-10-CM | POA: Diagnosis not present

## 2020-07-22 DIAGNOSIS — N281 Cyst of kidney, acquired: Secondary | ICD-10-CM
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
## 2020-07-22 NOTE — Patient Instructions (Signed)

## 2020-07-29 ENCOUNTER — Other Ambulatory Visit: Payer: Self-pay | Admitting: Family Medicine

## 2020-07-29 DIAGNOSIS — I10 Essential (primary) hypertension: Secondary | ICD-10-CM

## 2020-07-29 DIAGNOSIS — K219 Gastro-esophageal reflux disease without esophagitis: Secondary | ICD-10-CM

## 2020-07-29 NOTE — Telephone Encounter (Signed)
Requested Prescriptions  Pending Prescriptions Disp Refills   metoprolol succinate (TOPROL-XL) 25 MG 24 hr tablet [Pharmacy Med Name: METOPROLOL SUCCINATE ER TABS 25MG ] 30 tablet 0    Sig: TAKE 1 TABLET AT BEDTIME (KEEP APPOINTMENT IN [place])     Cardiovascular:  Beta Blockers Failed - 07/29/2020  1:18 PM      Failed - Last BP in normal range    BP Readings from Last 1 Encounters:  07/22/20 (!) 148/80         Failed - Valid encounter within last 6 months    Recent Outpatient Visits          7 months ago Essential hypertension   [place], Deanna C, MD   1 year ago Hospital discharge follow-up   [facility] Juline Patch, MD   1 year ago TIA (transient ischemic attack)   [facility] Juline Patch, MD   2 years ago Hospital discharge follow-up   [facility] Juline Patch, MD   2 years ago Annual physical exam   [facility] Juline Patch, MD             Passed - Last Heart Rate in normal range    Pulse Readings from Last 1 Encounters:  07/22/20 60          pantoprazole (PROTONIX) 40 MG tablet [Pharmacy Med Name: PANTOPRAZOLE SODIUM DR TABS 40MG ] 180 tablet 1    Sig: TAKE 1 TABLET TWICE A DAY BEFORE MEALS     Gastroenterology: Proton Pump Inhibitors Passed - 07/29/2020  1:18 PM      Passed - Valid encounter within last 12 months    Recent Outpatient Visits          7 months ago Essential hypertension   [facility] Juline Patch, MD   1 year ago Hospital discharge follow-up   [facility] Juline Patch, MD   1 year ago TIA (transient ischemic attack)   [facility] Juline Patch, MD   2 years ago Hospital discharge follow-up   [facility] Juline Patch, MD   2 years ago Annual physical exam   [facility] Juline Patch, MD             called and left VM for patient to schedule 6 month check up. Gave courtesy refill metoprolol #[phone]  refills

## 2020-08-01 ENCOUNTER — Encounter: Payer: Self-pay | Admitting: Family Medicine

## 2020-08-01 ENCOUNTER — Other Ambulatory Visit: Payer: Self-pay

## 2020-08-01 ENCOUNTER — Ambulatory Visit (INDEPENDENT_AMBULATORY_CARE_PROVIDER_SITE_OTHER): Payer: Medicare Other | Admitting: Family Medicine

## 2020-08-01 ENCOUNTER — Telehealth: Payer: Self-pay | Admitting: Family Medicine

## 2020-08-01 VITALS — BP 120/70 | HR 80 | Ht 69.0 in | Wt 205.0 lb

## 2020-08-01 DIAGNOSIS — K219 Gastro-esophageal reflux disease without esophagitis: Secondary | ICD-10-CM | POA: Diagnosis not present

## 2020-08-01 DIAGNOSIS — I10 Essential (primary) hypertension: Secondary | ICD-10-CM

## 2020-08-01 DIAGNOSIS — E781 Pure hyperglyceridemia: Secondary | ICD-10-CM

## 2020-08-01 DIAGNOSIS — R7401 Elevation of levels of liver transaminase levels: Secondary | ICD-10-CM | POA: Diagnosis not present

## 2020-08-01 DIAGNOSIS — Z23 Encounter for immunization: Secondary | ICD-10-CM

## 2020-08-01 DIAGNOSIS — F4323 Adjustment disorder with mixed anxiety and depressed mood: Secondary | ICD-10-CM

## 2020-08-01 DIAGNOSIS — E782 Mixed hyperlipidemia: Secondary | ICD-10-CM | POA: Diagnosis not present

## 2020-08-01 MED ORDER — ROSUVASTATIN CALCIUM 10 MG PO TABS: 10.0000 mg | ORAL_TABLET | Freq: Every day | ORAL | 1 refills | Status: DC

## 2020-08-01 MED ORDER — METOPROLOL SUCCINATE ER 25 MG PO TB24: ORAL_TABLET | ORAL | 1 refills | Status: DC

## 2020-08-01 MED ORDER — PANTOPRAZOLE SODIUM 40 MG PO TBEC: DELAYED_RELEASE_TABLET | ORAL | 1 refills | Status: DC

## 2020-08-01 MED ORDER — CITALOPRAM HYDROBROMIDE 40 MG PO TABS: 40.0000 mg | ORAL_TABLET | Freq: Every day | ORAL | 1 refills | Status: DC

## 2020-08-01 NOTE — Telephone Encounter (Signed)
Called pt told him that we were unaware of the change but we do see it in his chart. Told pt that we will call the pharmacy and to get everything correct. Pt verbalized understanding.  KP
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
## 2020-08-01 NOTE — Telephone Encounter (Signed)
Patient would like Baxter Flattery to call him back regarding his Crestor medication.  Patient stated that his cardiologist increased the MG to 20 and his script is for 10 MG.  Please advise and call patient to change.  CB# [phone]

## 2020-08-02 LAB — COMPREHENSIVE METABOLIC PANEL
ALT: 28 IU/L (ref 0–44)
AST: 33 IU/L (ref 0–40)
Albumin/Globulin Ratio: 2.1 (ref 1.2–2.2)
Albumin: 4.8 g/dL — ABNORMAL HIGH (ref 3.7–4.7)
Alkaline Phosphatase: 51 IU/L (ref 44–121)
BUN/Creatinine Ratio: 9 — ABNORMAL LOW (ref 10–24)
BUN: 13 mg/dL (ref 8–27)
Bilirubin Total: 0.9 mg/dL (ref 0.0–1.2)
CO2: 22 mmol/L (ref 20–29)
Calcium: 9.9 mg/dL (ref 8.6–10.2)
Chloride: 103 mmol/L (ref 96–106)
Creatinine, Ser: 1.43 mg/dL — ABNORMAL HIGH (ref 0.76–1.27)
GFR calc Af Amer: 55 mL/min/{1.73_m2} — ABNORMAL LOW (ref >59)
GFR calc non Af Amer: 48 mL/min/{1.73_m2} — ABNORMAL LOW (ref >59)
Globulin, Total: 2.3 g/dL (ref 1.5–4.5)
Glucose: 89 mg/dL (ref 65–99)
Potassium: 5.2 mmol/L (ref 3.5–5.2)
Sodium: 140 mmol/L (ref 134–144)
Total Protein: 7.1 g/dL (ref 6.0–8.5)

## 2020-08-02 LAB — HEPATIC FUNCTION PANEL (6): Bilirubin, Direct: 0.25 mg/dL (ref 0.00–0.40)

## 2020-08-02 LAB — TRIGLYCERIDES: Triglycerides: 165 mg/dL — ABNORMAL HIGH (ref 0–149)

## 2020-08-24 DIAGNOSIS — R200 Anesthesia of skin: Secondary | ICD-10-CM | POA: Diagnosis not present

## 2020-08-24 DIAGNOSIS — R202 Paresthesia of skin: Secondary | ICD-10-CM | POA: Diagnosis not present

## 2020-08-24 DIAGNOSIS — Z8673 Personal history of transient ischemic attack (TIA), and cerebral infarction without residual deficits: Secondary | ICD-10-CM | POA: Diagnosis not present

## 2020-08-24 DIAGNOSIS — R531 Weakness: Secondary | ICD-10-CM | POA: Diagnosis not present

## 2020-11-07 DIAGNOSIS — I10 Essential (primary) hypertension: Secondary | ICD-10-CM | POA: Diagnosis not present

## 2020-11-07 DIAGNOSIS — N1832 Chronic kidney disease, stage 3b: Secondary | ICD-10-CM | POA: Diagnosis not present

## 2020-11-07 DIAGNOSIS — N281 Cyst of kidney, acquired: Secondary | ICD-10-CM | POA: Diagnosis not present

## 2020-12-21 ENCOUNTER — Ambulatory Visit (INDEPENDENT_AMBULATORY_CARE_PROVIDER_SITE_OTHER): Payer: Medicare Other

## 2020-12-21 DIAGNOSIS — Z0000 Encounter for general adult medical examination without abnormal findings: Secondary | ICD-10-CM
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
## 2020-12-21 NOTE — Patient Instructions (Signed)
Luis Strong , Thank you for taking time to come for your Medicare Wellness Visit. I appreciate your ongoing commitment to your health goals. Please review the following plan we discussed and let me know if I can assist you in the future.   Screening recommendations/referrals: Colonoscopy: done 11/25/17. Repeat in 2024.  Recommended yearly ophthalmology/optometry visit for glaucoma screening and checkup Recommended yearly dental visit for hygiene and checkup  Vaccinations: Influenza vaccine: done 08/01/20 Pneumococcal vaccine: done 07/02/17 Tdap vaccine: due Shingles vaccine: done 07/09/17 & 10/18/17   Covid-19: done 01/16/20, 02/13/20 & 09/09/20  Advanced directives: Please bring a copy of your health care power of attorney and living will to the office at your convenience.  Conditions/risks identified: Keep up the great work!  Next appointment: Follow up in one year for your annual wellness visit.   Preventive Care 76 Years and Older, Male Preventive care refers to lifestyle choices and visits with your health care provider that can promote health and wellness. What does preventive care include?  A yearly physical exam. This is also called an annual well check.  Dental exams once or twice a year.  Routine eye exams. Ask your health care provider how often you should have your eyes checked.  Personal lifestyle choices, including:  Daily care of your teeth and gums.  Regular physical activity.  Eating a healthy diet.  Avoiding tobacco and drug use.  Limiting alcohol use.  Practicing safe sex.  Taking low doses of aspirin every day.  Taking vitamin and mineral supplements as recommended by your health care provider. What happens during an annual well check? The services and screenings done by your health care provider during your annual well check will depend on your age, overall health, lifestyle risk factors, and family history of disease. Counseling  Your health care  provider may ask you questions about your:  Alcohol use.  Tobacco use.  Drug use.  Emotional well-being.  Home and relationship well-being.  Sexual activity.  Eating habits.  History of falls.  Memory and ability to understand (cognition).  Work and work Statistician. Screening  You may have the following tests or measurements:  Height, weight, and BMI.  Blood pressure.  Lipid and cholesterol levels. These may be checked every 5 years, or more frequently if you are over 76 years old.  Skin check.  Lung cancer screening. You may have this screening every year starting at age 76 if you have a 30-pack-year history of smoking and currently smoke or have quit within the past 15 years.  Fecal occult blood test (FOBT) of the stool. You may have this test every year starting at age 76.  Flexible sigmoidoscopy or colonoscopy. You may have a sigmoidoscopy every 5 years or a colonoscopy every 10 years starting at age 76.  Prostate cancer screening. Recommendations will vary depending on your family history and other risks.  Hepatitis C blood test.  Hepatitis B blood test.  Sexually transmitted disease (STD) testing.  Diabetes screening. This is done by checking your blood sugar (glucose) after you have not eaten for a while (fasting). You may have this done every 1-3 years.  Abdominal aortic aneurysm (AAA) screening. You may need this if you are a current or former smoker.  Osteoporosis. You may be screened starting at age 76 if you are at high risk. Talk with your health care provider about your test results, treatment options, and if necessary, the need for more tests. Vaccines  Your health care provider may  recommend certain vaccines, such as:  Influenza vaccine. This is recommended every year.  Tetanus, diphtheria, and acellular pertussis (Tdap, Td) vaccine. You may need a Td booster every 10 years.  Zoster vaccine. You may need this after age 33.  Pneumococcal  13-valent conjugate (PCV13) vaccine. One dose is recommended after age 33.  Pneumococcal polysaccharide (PPSV23) vaccine. One dose is recommended after age 72. Talk to your health care provider about which screenings and vaccines you need and how often you need them. This information is not intended to replace advice given to you by your health care provider. Make sure you discuss any questions you have with your health care provider. Document Released: 12/02/2015 Document Revised: 07/25/2016 Document Reviewed: 09/06/2015 Elsevier Interactive Patient Education  2017 [place] Prevention in the Home Falls can cause injuries. They can happen to people of all ages. There are many things you can do to make your home safe and to help prevent falls. What can I do on the outside of my home?  Regularly fix the edges of walkways and driveways and fix any cracks.  Remove anything that might make you trip as you walk through a door, such as a raised step or threshold.  Trim any bushes or trees on the path to your home.  Use bright outdoor lighting.  Clear any walking paths of anything that might make someone trip, such as rocks or tools.  Regularly check to see if handrails are loose or broken. Make sure that both sides of any steps have handrails.  Any raised decks and porches should have guardrails on the edges.  Have any leaves, snow, or ice cleared regularly.  Use sand or salt on walking paths during winter.  Clean up any spills in your garage right away. This includes oil or grease spills. What can I do in the bathroom?  Use night lights.  Install grab bars by the toilet and in the tub and shower. Do not use towel bars as grab bars.  Use non-skid mats or decals in the tub or shower.  If you need to sit down in the shower, use a plastic, non-slip stool.  Keep the floor dry. Clean up any water that spills on the floor as soon as it happens.  Remove soap buildup in the  tub or shower regularly.  Attach bath mats securely with double-sided non-slip rug tape.  Do not have throw rugs and other things on the floor that can make you trip. What can I do in the bedroom?  Use night lights.  Make sure that you have a light by your bed that is easy to reach.  Do not use any sheets or blankets that are too big for your bed. They should not hang down onto the floor.  Have a firm chair that has side arms. You can use this for support while you get dressed.  Do not have throw rugs and other things on the floor that can make you trip. What can I do in the kitchen?  Clean up any spills right away.  Avoid walking on wet floors.  Keep items that you use a lot in easy-to-reach places.  If you need to reach something above you, use a strong step stool that has a grab bar.  Keep electrical cords out of the way.  Do not use floor polish or wax that makes floors slippery. If you must use wax, use non-skid floor wax.  Do not have throw rugs and  other things on the floor that can make you trip. What can I do with my stairs?  Do not leave any items on the stairs.  Make sure that there are handrails on both sides of the stairs and use them. Fix handrails that are broken or loose. Make sure that handrails are as long as the stairways.  Check any carpeting to make sure that it is firmly attached to the stairs. Fix any carpet that is loose or worn.  Avoid having throw rugs at the top or bottom of the stairs. If you do have throw rugs, attach them to the floor with carpet tape.  Make sure that you have a light switch at the top of the stairs and the bottom of the stairs. If you do not have them, ask someone to add them for you. What else can I do to help prevent falls?  Wear shoes that:  Do not have high heels.  Have rubber bottoms.  Are comfortable and fit you well.  Are closed at the toe. Do not wear sandals.  If you use a stepladder:  Make sure that it  is fully opened. Do not climb a closed stepladder.  Make sure that both sides of the stepladder are locked into place.  Ask someone to hold it for you, if possible.  Clearly mark and make sure that you can see:  Any grab bars or handrails.  First and last steps.  Where the edge of each step is.  Use tools that help you move around (mobility aids) if they are needed. These include:  Canes.  Walkers.  Scooters.  Crutches.  Turn on the lights when you go into a dark area. Replace any light bulbs as soon as they burn out.  Set up your furniture so you have a clear path. Avoid moving your furniture around.  If any of your floors are uneven, fix them.  If there are any pets around you, be aware of where they are.  Review your medicines with your doctor. Some medicines can make you feel dizzy. This can increase your chance of falling. Ask your doctor what other things that you can do to help prevent falls. This information is not intended to replace advice given to you by your health care provider. Make sure you discuss any questions you have with your health care provider. Document Released: 09/01/2009 Document Revised: 04/12/2016 Document Reviewed: 12/10/2014 Elsevier Interactive Patient Education  2017 [employer].

## 2020-12-27 ENCOUNTER — Other Ambulatory Visit: Payer: Self-pay | Admitting: Family Medicine

## 2020-12-27 DIAGNOSIS — K219 Gastro-esophageal reflux disease without esophagitis: Secondary | ICD-10-CM

## 2021-01-16 ENCOUNTER — Other Ambulatory Visit: Payer: Self-pay

## 2021-01-16 ENCOUNTER — Ambulatory Visit
Admission: RE | Admit: 2021-01-16 | Discharge: 2021-01-16 | Disposition: A | Discharge status: Home / Self Care | Payer: Medicare Other | Source: Ambulatory Visit | Attending: Urology | Admitting: Urology

## 2021-01-16 DIAGNOSIS — N281 Cyst of kidney, acquired: Secondary | ICD-10-CM | POA: Insufficient documentation

## 2021-01-16 DIAGNOSIS — N200 Calculus of kidney: Secondary | ICD-10-CM | POA: Diagnosis not present

## 2021-01-16 DIAGNOSIS — N1330 Unspecified hydronephrosis: Secondary | ICD-10-CM | POA: Diagnosis not present

## 2021-01-18 ENCOUNTER — Ambulatory Visit: Payer: Medicare Other | Admitting: Family Medicine

## 2021-01-19 ENCOUNTER — Other Ambulatory Visit: Payer: Self-pay | Admitting: *Deleted

## 2021-01-19 ENCOUNTER — Other Ambulatory Visit: Payer: Self-pay | Admitting: Family Medicine

## 2021-01-19 DIAGNOSIS — I10 Essential (primary) hypertension: Secondary | ICD-10-CM

## 2021-01-19 DIAGNOSIS — N200 Calculus of kidney: Secondary | ICD-10-CM
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
## 2021-01-20 ENCOUNTER — Ambulatory Visit: Payer: Self-pay | Admitting: Urology

## 2021-01-20 ENCOUNTER — Ambulatory Visit
Admission: RE | Admit: 2021-01-20 | Discharge: 2021-01-20 | Disposition: A | Discharge status: Home / Self Care | Payer: Medicare Other | Attending: Urology | Admitting: Urology

## 2021-01-20 ENCOUNTER — Ambulatory Visit
Admission: RE | Admit: 2021-01-20 | Discharge: 2021-01-20 | Disposition: A | Discharge status: Home / Self Care | Payer: Medicare Other | Source: Ambulatory Visit | Attending: Urology | Admitting: Urology

## 2021-01-20 DIAGNOSIS — N200 Calculus of kidney: Secondary | ICD-10-CM | POA: Diagnosis not present

## 2021-01-20 DIAGNOSIS — I878 Other specified disorders of veins: Secondary | ICD-10-CM | POA: Diagnosis not present

## 2021-01-20 DIAGNOSIS — Z9049 Acquired absence of other specified parts of digestive tract: Secondary | ICD-10-CM | POA: Diagnosis not present
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
## 2021-01-23 ENCOUNTER — Ambulatory Visit (INDEPENDENT_AMBULATORY_CARE_PROVIDER_SITE_OTHER): Payer: Medicare Other | Admitting: Urology

## 2021-01-23 ENCOUNTER — Other Ambulatory Visit: Payer: Self-pay | Admitting: *Deleted

## 2021-01-23 ENCOUNTER — Other Ambulatory Visit
Admission: RE | Admit: 2021-01-23 | Discharge: 2021-01-23 | Disposition: A | Discharge status: Home / Self Care | Payer: Medicare Other | Attending: Urology | Admitting: Urology

## 2021-01-23 ENCOUNTER — Encounter: Payer: Self-pay | Admitting: Urology

## 2021-01-23 ENCOUNTER — Other Ambulatory Visit: Payer: Self-pay

## 2021-01-23 VITALS — BP 143/74 | HR 65 | Ht 69.0 in | Wt 204.0 lb

## 2021-01-23 DIAGNOSIS — N200 Calculus of kidney: Secondary | ICD-10-CM | POA: Insufficient documentation

## 2021-01-23 DIAGNOSIS — N132 Hydronephrosis with renal and ureteral calculous obstruction: Secondary | ICD-10-CM | POA: Diagnosis not present

## 2021-01-23 DIAGNOSIS — N281 Cyst of kidney, acquired: Secondary | ICD-10-CM | POA: Diagnosis not present

## 2021-01-23 DIAGNOSIS — N139 Obstructive and reflux uropathy, unspecified: Secondary | ICD-10-CM | POA: Diagnosis not present

## 2021-01-23 LAB — URINALYSIS, COMPLETE (UACMP) WITH MICROSCOPIC
Bilirubin Urine: NEGATIVE
Glucose, UA: NEGATIVE mg/dL
Hgb urine dipstick: NEGATIVE
Ketones, ur: NEGATIVE mg/dL
Leukocytes,Ua: NEGATIVE
Nitrite: NEGATIVE
Protein, ur: NEGATIVE mg/dL
Specific Gravity, Urine: 1.02 (ref 1.005–1.030)
pH: 5.5 (ref 5.0–8.0)

## 2021-01-23 MED ORDER — TAMSULOSIN HCL 0.4 MG PO CAPS: 0.4000 mg | ORAL_CAPSULE | Freq: Every day | ORAL | 3 refills | Status: DC

## 2021-01-23 MED ORDER — OXYCODONE-ACETAMINOPHEN 10-325 MG PO TABS: 1.0000 | ORAL_TABLET | ORAL | 0 refills | Status: DC | PRN

## 2021-01-23 MED ORDER — SULFAMETHOXAZOLE-TRIMETHOPRIM 800-160 MG PO TABS: 1.0000 | ORAL_TABLET | Freq: Two times a day (BID) | ORAL | 0 refills | Status: DC

## 2021-01-24 ENCOUNTER — Encounter: Payer: Self-pay | Admitting: Family Medicine

## 2021-01-24 ENCOUNTER — Ambulatory Visit (INDEPENDENT_AMBULATORY_CARE_PROVIDER_SITE_OTHER): Payer: Medicare Other | Admitting: Family Medicine

## 2021-01-24 VITALS — BP 120/78 | HR 64 | Ht 69.0 in | Wt 209.0 lb

## 2021-01-24 DIAGNOSIS — F4323 Adjustment disorder with mixed anxiety and depressed mood: Secondary | ICD-10-CM | POA: Diagnosis not present

## 2021-01-24 DIAGNOSIS — K219 Gastro-esophageal reflux disease without esophagitis: Secondary | ICD-10-CM

## 2021-01-24 DIAGNOSIS — I10 Essential (primary) hypertension: Secondary | ICD-10-CM

## 2021-01-24 LAB — URINE CULTURE: Culture: <10000 — AB

## 2021-01-24 MED ORDER — METOPROLOL SUCCINATE ER 25 MG PO TB24: 25.0000 mg | ORAL_TABLET | Freq: Every day | ORAL | 1 refills | Status: DC

## 2021-01-24 MED ORDER — CITALOPRAM HYDROBROMIDE 40 MG PO TABS: 40.0000 mg | ORAL_TABLET | Freq: Every day | ORAL | 1 refills | Status: DC

## 2021-01-24 MED ORDER — PANTOPRAZOLE SODIUM 40 MG PO TBEC: DELAYED_RELEASE_TABLET | ORAL | 1 refills | Status: DC
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
## 2021-01-25 ENCOUNTER — Other Ambulatory Visit: Payer: Self-pay | Admitting: Radiology

## 2021-01-25 DIAGNOSIS — N132 Hydronephrosis with renal and ureteral calculous obstruction: Secondary | ICD-10-CM

## 2021-01-25 DIAGNOSIS — N200 Calculus of kidney: Secondary | ICD-10-CM

## 2021-01-25 LAB — RENAL FUNCTION PANEL
Albumin: 4.8 g/dL — ABNORMAL HIGH (ref 3.7–4.7)
BUN/Creatinine Ratio: 9 — ABNORMAL LOW (ref 10–24)
BUN: 13 mg/dL (ref 8–27)
CO2: 19 mmol/L — ABNORMAL LOW (ref 20–29)
Calcium: 9.5 mg/dL (ref 8.6–10.2)
Chloride: 103 mmol/L (ref 96–106)
Creatinine, Ser: 1.51 mg/dL — ABNORMAL HIGH (ref 0.76–1.27)
Glucose: 96 mg/dL (ref 65–99)
Phosphorus: 3.5 mg/dL (ref 2.8–4.1)
Potassium: 4.8 mmol/L (ref 3.5–5.2)
Sodium: 138 mmol/L (ref 134–144)
eGFR: 48 mL/min/{1.73_m2} — ABNORMAL LOW (ref >59)

## 2021-01-26 ENCOUNTER — Encounter
Admission: RE | Admit: 2021-01-26 | Discharge: 2021-01-26 | Disposition: A | Discharge status: Home / Self Care | Payer: Medicare Other | Source: Ambulatory Visit | Attending: Urology | Admitting: Urology

## 2021-01-26 ENCOUNTER — Other Ambulatory Visit: Payer: Self-pay

## 2021-01-26 HISTORY — DX: Personal history of urinary calculi: Z87.442

## 2021-01-26 HISTORY — DX: Sleep apnea, unspecified: G47.30

## 2021-01-26 NOTE — Patient Instructions (Signed)
Your procedure is scheduled on: Friday 02/03/21.  Report to THE FIRST FLOOR REGISTRATION DESK IN THE MEDICAL MALL ON THE MORNING OF SURGERY FIRST, THEN YOU WILL CHECK IN AT THE SURGERY INFORMATION DESK LOCATED OUTSIDE THE SAME DAY SURGERY DEPARTMENT LOCATED ON 2ND FLOOR MEDICAL MALL ENTRANCE.  To find out your arrival time please call [phone] between 1PM - 3PM on Thursday 02/02/21.   Remember: Instructions that are not followed completely may result in serious medical risk, up to and including death, or upon the discretion of your surgeon and anesthesiologist your surgery may need to be rescheduled.     __X__ 1. Do not eat food after midnight the night before your procedure.                 No gum chewing or hard candies. You may drink clear liquids up to 2 hours                 before you are scheduled to arrive for your surgery- DO NOT drink clear                 liquids within 2 hours of the start of your surgery.                 Clear Liquids include:  water, apple juice without pulp, clear carbohydrate                 drink such as Clearfast or Gatorade, Black Coffee or Tea (Do not add                 milk or creamer to coffee or tea).  __X__2.  On the morning of surgery brush your teeth with toothpaste and water, you may rinse your mouth with mouthwash if you wish.  Do not swallow any toothpaste or mouthwash.    __X__ 3.  No Alcohol for 24 hours before or after surgery.  __X__ 4.  Do Not Smoke or use e-cigarettes For 24 Hours Prior to Your Surgery.                 Do not use any chewable tobacco products for at least 6 hours prior to                 surgery.  __X__5.  Notify your doctor if there is any change in your medical condition      (cold, fever, infections).      Do NOT wear jewelry, make-up, hairpins, clips or nail polish. Do NOT wear lotions, powders, or perfumes.  Do NOT shave 48 hours prior to surgery. Men may shave face and neck. Do NOT bring valuables to the  hospital.     [facility] is not responsible for any belongings or valuables.   Contacts, dentures/partials or body piercings may not be worn into surgery. Bring a case for your contacts, glasses or hearing aids, a denture cup will be supplied.    Patients discharged the day of surgery will not be allowed to drive home.     __X__ Take these medicines the morning of surgery with A SIP OF WATER:     1. citalopram (CELEXA)   2. pantoprazole (PROTONIX)      __X__ Stop Blood Thinners: Aspirin now.    __X__ Stop Anti-inflammatories 7 days before surgery such as Advil, Ibuprofen, Motrin, BC or Goodies Powder, Naprosyn, Naproxen, Aleve, Aspirin, Meloxicam. May take Tylenol or Percocet if needed for pain or discomfort.  __X__Do not start taking any new herbal supplements or vitamins prior to your procedure.  __X__ Stop the following herbal supplements or vitamins:  Flaxseed, Linseed, (FLAXSEED OIL)  Omega-3 Fatty Acids (OMEGA-3 FISH OIL PO)  PLANT STANOL    Wear comfortable clothing (specific to your surgery type) to the hospital.  Plan for stool softeners for home use; pain medications have a tendency to cause constipation. You can also help prevent constipation by eating foods high in fiber such as fruits and vegetables and drinking plenty of fluids as your diet allows.  After surgery, you can prevent lung complications by doing breathing exercises.Take deep breaths and cough every 1-2 hours. Your doctor may order a device called an Incentive Spirometer to help you take deep breaths.  Please call the [facility] Department at [phone] if you have any questions about these instructions.

## 2021-01-27 ENCOUNTER — Encounter
Admission: RE | Admit: 2021-01-27 | Discharge: 2021-01-27 | Disposition: A | Discharge status: Home / Self Care | Payer: Medicare Other | Source: Ambulatory Visit | Attending: Urology | Admitting: Urology

## 2021-01-27 DIAGNOSIS — Z01818 Encounter for other preprocedural examination: Secondary | ICD-10-CM | POA: Insufficient documentation

## 2021-01-27 DIAGNOSIS — I10 Essential (primary) hypertension: Secondary | ICD-10-CM | POA: Diagnosis not present

## 2021-01-27 DIAGNOSIS — Z01810 Encounter for preprocedural cardiovascular examination: Secondary | ICD-10-CM | POA: Diagnosis not present

## 2021-01-27 LAB — CBC
HCT: 44.7 % (ref 39.0–52.0)
Hemoglobin: 14.9 g/dL (ref 13.0–17.0)
MCH: 29.2 pg (ref 26.0–34.0)
MCHC: 33.3 g/dL (ref 30.0–36.0)
MCV: 87.5 fL (ref 80.0–100.0)
Platelets: 123 10*3/uL — ABNORMAL LOW (ref 150–400)
RBC: 5.11 MIL/uL (ref 4.22–5.81)
RDW: 13.4 % (ref 11.5–15.5)
WBC: 6.8 10*3/uL (ref 4.0–10.5)
nRBC: 0 % (ref 0.0–0.2)

## 2021-01-30 ENCOUNTER — Other Ambulatory Visit: Payer: Self-pay | Admitting: *Deleted

## 2021-01-30 MED ORDER — SULFAMETHOXAZOLE-TRIMETHOPRIM 800-160 MG PO TABS: 1.0000 | ORAL_TABLET | Freq: Two times a day (BID) | ORAL | 0 refills | Status: DC

## 2021-02-01 ENCOUNTER — Other Ambulatory Visit
Admission: RE | Admit: 2021-02-01 | Discharge: 2021-02-01 | Disposition: A | Discharge status: Home / Self Care | Payer: Medicare Other | Source: Ambulatory Visit | Attending: Urology | Admitting: Urology

## 2021-02-01 ENCOUNTER — Other Ambulatory Visit: Payer: Self-pay

## 2021-02-01 DIAGNOSIS — Z01812 Encounter for preprocedural laboratory examination: Secondary | ICD-10-CM | POA: Insufficient documentation

## 2021-02-01 DIAGNOSIS — Z20822 Contact with and (suspected) exposure to covid-19: Secondary | ICD-10-CM | POA: Diagnosis not present

## 2021-02-01 LAB — SARS CORONAVIRUS 2 (TAT 6-24 HRS): SARS Coronavirus 2: NEGATIVE

## 2021-02-03 ENCOUNTER — Ambulatory Visit: Payer: Medicare Other | Admitting: Anesthesiology

## 2021-02-03 ENCOUNTER — Telehealth: Payer: Self-pay | Admitting: Urology

## 2021-02-03 ENCOUNTER — Ambulatory Visit: Payer: Medicare Other

## 2021-02-03 ENCOUNTER — Encounter
Admission: RE | Disposition: A | Discharge status: Home / Self Care | Payer: Self-pay | Source: Home / Self Care | Attending: Urology

## 2021-02-03 ENCOUNTER — Encounter: Payer: Self-pay | Admitting: Urology

## 2021-02-03 ENCOUNTER — Ambulatory Visit
Admission: RE | Admit: 2021-02-03 | Discharge: 2021-02-03 | Disposition: A | Discharge status: Home / Self Care | Payer: Medicare Other | Attending: Urology | Admitting: Urology

## 2021-02-03 DIAGNOSIS — N281 Cyst of kidney, acquired: Secondary | ICD-10-CM | POA: Insufficient documentation

## 2021-02-03 DIAGNOSIS — I10 Essential (primary) hypertension: Secondary | ICD-10-CM | POA: Diagnosis not present

## 2021-02-03 DIAGNOSIS — N200 Calculus of kidney: Secondary | ICD-10-CM

## 2021-02-03 DIAGNOSIS — K219 Gastro-esophageal reflux disease without esophagitis: Secondary | ICD-10-CM | POA: Diagnosis not present

## 2021-02-03 DIAGNOSIS — K922 Gastrointestinal hemorrhage, unspecified: Secondary | ICD-10-CM | POA: Diagnosis not present

## 2021-02-03 DIAGNOSIS — I129 Hypertensive chronic kidney disease with stage 1 through stage 4 chronic kidney disease, or unspecified chronic kidney disease: Secondary | ICD-10-CM | POA: Diagnosis not present

## 2021-02-03 DIAGNOSIS — Z955 Presence of coronary angioplasty implant and graft: Secondary | ICD-10-CM | POA: Insufficient documentation

## 2021-02-03 DIAGNOSIS — Z79899 Other long term (current) drug therapy: Secondary | ICD-10-CM | POA: Diagnosis not present

## 2021-02-03 DIAGNOSIS — Z85828 Personal history of other malignant neoplasm of skin: Secondary | ICD-10-CM | POA: Diagnosis not present

## 2021-02-03 DIAGNOSIS — K279 Peptic ulcer, site unspecified, unspecified as acute or chronic, without hemorrhage or perforation: Secondary | ICD-10-CM | POA: Diagnosis not present

## 2021-02-03 DIAGNOSIS — N183 Chronic kidney disease, stage 3 unspecified: Secondary | ICD-10-CM | POA: Diagnosis not present

## 2021-02-03 DIAGNOSIS — N201 Calculus of ureter: Secondary | ICD-10-CM | POA: Diagnosis not present

## 2021-02-03 DIAGNOSIS — N132 Hydronephrosis with renal and ureteral calculous obstruction: Secondary | ICD-10-CM | POA: Diagnosis not present

## 2021-02-03 HISTORY — PX: CYSTOSCOPY/URETEROSCOPY/HOLMIUM LASER/STENT PLACEMENT: SHX6546

## 2021-02-03 SURGERY — CYSTOSCOPY/URETEROSCOPY/HOLMIUM LASER/STENT PLACEMENT
Anesthesia: General | Site: Ureter | Laterality: Left

## 2021-02-03 MED ORDER — ROCURONIUM BROMIDE 10 MG/ML (PF) SYRINGE
PREFILLED_SYRINGE | INTRAVENOUS | Status: AC
  Filled 2021-02-03: qty 10

## 2021-02-03 MED ORDER — SUCCINYLCHOLINE CHLORIDE 20 MG/ML IJ SOLN
INTRAMUSCULAR | Status: DC | PRN
  Administered 2021-02-03: 100 mg via INTRAVENOUS

## 2021-02-03 MED ORDER — LIDOCAINE HCL (CARDIAC) PF 100 MG/5ML IV SOSY
PREFILLED_SYRINGE | INTRAVENOUS | Status: DC | PRN
  Administered 2021-02-03: 80 mg via INTRAVENOUS

## 2021-02-03 MED ORDER — GLYCOPYRROLATE 0.2 MG/ML IJ SOLN
INTRAMUSCULAR | Status: DC | PRN
  Administered 2021-02-03: .2 mg via INTRAVENOUS

## 2021-02-03 MED ORDER — LACTATED RINGERS IV SOLN: INTRAVENOUS | Status: DC

## 2021-02-03 MED ORDER — FENTANYL CITRATE (PF) 100 MCG/2ML IJ SOLN
INTRAMUSCULAR | Status: DC | PRN
  Administered 2021-02-03: 100 ug via INTRAVENOUS

## 2021-02-03 MED ORDER — FENTANYL CITRATE (PF) 100 MCG/2ML IJ SOLN
25.0000 ug | INTRAMUSCULAR | Status: DC | PRN
Start: 2021-02-03 — End: 2021-02-03

## 2021-02-03 MED ORDER — ONDANSETRON HCL 4 MG/2ML IJ SOLN
INTRAMUSCULAR | Status: DC | PRN
  Administered 2021-02-03: 4 mg via INTRAVENOUS

## 2021-02-03 MED ORDER — CEFAZOLIN SODIUM-DEXTROSE 2-4 GM/100ML-% IV SOLN
INTRAVENOUS | Status: AC
  Filled 2021-02-03: qty 100

## 2021-02-03 MED ORDER — SUGAMMADEX SODIUM 200 MG/2ML IV SOLN
INTRAVENOUS | Status: DC | PRN
  Administered 2021-02-03: 200 mg via INTRAVENOUS

## 2021-02-03 MED ORDER — IOPAMIDOL (ISOVUE-200) INJECTION 41%
INTRAVENOUS | Status: DC | PRN
  Administered 2021-02-03: 20 mL via INTRAVENOUS

## 2021-02-03 MED ORDER — EPHEDRINE SULFATE 50 MG/ML IJ SOLN
INTRAMUSCULAR | Status: DC | PRN
  Administered 2021-02-03 (×2): 10 mg via INTRAVENOUS

## 2021-02-03 MED ORDER — CEFAZOLIN SODIUM-DEXTROSE 2-4 GM/100ML-% IV SOLN
2.0000 g | INTRAVENOUS | Status: AC
  Administered 2021-02-03: 2 g via INTRAVENOUS

## 2021-02-03 MED ORDER — ACETAMINOPHEN 10 MG/ML IV SOLN
INTRAVENOUS | Status: DC | PRN
  Administered 2021-02-03: 1000 mg via INTRAVENOUS

## 2021-02-03 MED ORDER — ORAL CARE MOUTH RINSE: 15.0000 mL | Freq: Once | OROMUCOSAL | Status: AC

## 2021-02-03 MED ORDER — OXYCODONE HCL 5 MG/5ML PO SOLN
5.0000 mg | Freq: Once | ORAL | Status: DC | PRN
Start: 2021-02-03 — End: 2021-02-03

## 2021-02-03 MED ORDER — OXYBUTYNIN CHLORIDE 5 MG PO TABS: ORAL_TABLET | ORAL | 0 refills | Status: DC

## 2021-02-03 MED ORDER — FENTANYL CITRATE (PF) 100 MCG/2ML IJ SOLN
INTRAMUSCULAR | Status: AC
  Filled 2021-02-03: qty 2

## 2021-02-03 MED ORDER — ONDANSETRON HCL 4 MG/2ML IJ SOLN: 4.0000 mg | Freq: Once | INTRAMUSCULAR | Status: DC | PRN

## 2021-02-03 MED ORDER — CHLORHEXIDINE GLUCONATE 0.12 % MT SOLN
OROMUCOSAL | Status: AC
  Filled 2021-02-03: qty 15

## 2021-02-03 MED ORDER — PROPOFOL 10 MG/ML IV BOLUS
INTRAVENOUS | Status: AC
  Filled 2021-02-03: qty 20

## 2021-02-03 MED ORDER — LIDOCAINE HCL (PF) 2 % IJ SOLN
INTRAMUSCULAR | Status: AC
  Filled 2021-02-03: qty 5

## 2021-02-03 MED ORDER — ROCURONIUM BROMIDE 100 MG/10ML IV SOLN
INTRAVENOUS | Status: DC | PRN
  Administered 2021-02-03: 15 mg via INTRAVENOUS
  Administered 2021-02-03: 40 mg via INTRAVENOUS
  Administered 2021-02-03: 5 mg via INTRAVENOUS

## 2021-02-03 MED ORDER — OXYCODONE HCL 5 MG PO TABS: 5.0000 mg | ORAL_TABLET | Freq: Once | ORAL | Status: DC | PRN

## 2021-02-03 MED ORDER — ONDANSETRON HCL 4 MG/2ML IJ SOLN
INTRAMUSCULAR | Status: AC
  Filled 2021-02-03: qty 2

## 2021-02-03 MED ORDER — CHLORHEXIDINE GLUCONATE 0.12 % MT SOLN
15.0000 mL | Freq: Once | OROMUCOSAL | Status: AC
  Administered 2021-02-03: 15 mL via OROMUCOSAL

## 2021-02-03 MED ORDER — PHENYLEPHRINE HCL (PRESSORS) 10 MG/ML IV SOLN
INTRAVENOUS | Status: DC | PRN
  Administered 2021-02-03: 100 ug via INTRAVENOUS
  Administered 2021-02-03 (×2): 200 ug via INTRAVENOUS
  Administered 2021-02-03: 100 ug via INTRAVENOUS

## 2021-02-03 MED ORDER — PROPOFOL 10 MG/ML IV BOLUS
INTRAVENOUS | Status: DC | PRN
  Administered 2021-02-03: 170 mg via INTRAVENOUS

## 2021-02-03 SURGICAL SUPPLY — 33 items
BAG DRAIN CYSTO-URO LG1000N (MISCELLANEOUS) ×2 IMPLANT
BASKET ZERO TIP 1.9FR (BASKET) ×2 IMPLANT
BRUSH SCRUB EZ 1% IODOPHOR (MISCELLANEOUS) ×2 IMPLANT
BSKT STON RTRVL ZERO TP 1.9FR (BASKET) ×1
CATH URET FLEX-TIP 2 LUMEN 10F (CATHETERS) IMPLANT
CATH URETL 5X70 OPEN END (CATHETERS) IMPLANT
CNTNR SPEC 2.5X3XGRAD LEK (MISCELLANEOUS) ×1
CONT SPEC 4OZ STER OR WHT (MISCELLANEOUS) ×1
CONT SPEC 4OZ STRL OR WHT (MISCELLANEOUS) ×1
CONTAINER SPEC 2.5X3XGRAD LEK (MISCELLANEOUS) ×1 IMPLANT
DRAPE UTILITY 15X26 TOWEL STRL (DRAPES) ×2 IMPLANT
GLOVE SURG UNDER POLY LF SZ7.5 (GLOVE) ×4 IMPLANT
GOWN STRL REUS W/ TWL LRG LVL3 (GOWN DISPOSABLE) ×1 IMPLANT
GOWN STRL REUS W/ TWL XL LVL3 (GOWN DISPOSABLE) ×1 IMPLANT
GOWN STRL REUS W/TWL LRG LVL3 (GOWN DISPOSABLE) ×2
GOWN STRL REUS W/TWL XL LVL3 (GOWN DISPOSABLE) ×2
GUIDEWIRE STR DUAL SENSOR (WIRE) ×2 IMPLANT
INFUSOR MANOMETER BAG 3000ML (MISCELLANEOUS) ×2 IMPLANT
KIT TURNOVER CYSTO (KITS) ×2 IMPLANT
MANIFOLD NEPTUNE II (INSTRUMENTS) IMPLANT
PACK CYSTO AR (MISCELLANEOUS) ×2 IMPLANT
SET CYSTO W/LG BORE CLAMP LF (SET/KITS/TRAYS/PACK) ×2 IMPLANT
SHEATH URETERAL 12FR 45CM (SHEATH) ×2 IMPLANT
SHEATH URETERAL 12FRX35CM (MISCELLANEOUS) IMPLANT
SOL .9 NS 3000ML IRR  AL (IV SOLUTION) ×1
SOL .9 NS 3000ML IRR AL (IV SOLUTION) ×1
SOL .9 NS 3000ML IRR UROMATIC (IV SOLUTION) ×1 IMPLANT
STENT URET 6FRX24 CONTOUR (STENTS) ×2 IMPLANT
STENT URET 6FRX26 CONTOUR (STENTS) IMPLANT
SURGILUBE 2OZ TUBE FLIPTOP (MISCELLANEOUS) ×2 IMPLANT
TRACTIP FLEXIVA PULSE ID 200 (Laser) ×4 IMPLANT
VALVE UROSEAL ADJ ENDO (VALVE) ×2 IMPLANT
WATER STERILE IRR 1000ML POUR (IV SOLUTION) ×2 IMPLANT

## 2021-02-03 NOTE — Telephone Encounter (Signed)
-----   Message from Abbie Sons, MD sent at 02/03/2021 10:01 AM EDT ----- Regarding: Cystoscopy with stent removal Please schedule cystoscopy with stent removal ~ 1 week

## 2021-02-03 NOTE — Anesthesia Procedure Notes (Signed)
Procedure Name: Intubation Date/Time: 02/03/2021 7:40 AM Performed by: Allean Found, CRNA Pre-anesthesia Checklist: Patient identified, Patient being monitored, Timeout performed, Emergency Drugs available and Suction available Patient Re-evaluated:Patient Re-evaluated prior to induction Oxygen Delivery Method: Circle system utilized Preoxygenation: Pre-oxygenation with 100% oxygen Induction Type: IV induction Ventilation: Mask ventilation without difficulty Laryngoscope Size: McGraph and 4 Grade View: Grade II Tube type: Oral Tube size: 7.5 mm Number of attempts: 1 Airway Equipment and Method: Stylet Placement Confirmation: ETT inserted through vocal cords under direct vision,  positive ETCO2 and breath sounds checked- equal and bilateral Secured at: 21 cm Tube secured with: Tape Dental Injury: Teeth and Oropharynx as per pre-operative assessment

## 2021-02-03 NOTE — Op Note (Signed)
Preoperative diagnosis: Left nephrolithiasis   Postoperative diagnosis: Left nephrolithiasis  Procedure:  1. Cystoscopy 2. Left ureteroscopy and stone removal 3. Ureteroscopic laser lithotripsy 4. Left ureteral stent placement (6FR/24 cm) 5. Left retrograde pyelography with interpretation  Surgeon: Nicki Reaper C. Stoioff, M.D.  Anesthesia: General  Complications: None  Intraoperative findings:  1.  Cystoscopy-mild tightness distal urethra which did not preclude cystoscope passage; mild lateral lobe enlargement with moderate bladder neck elevation and a small median lobe; bladder mucosa without erythema, solid or papillary lesions; UOs orthotopic with clear efflux  2.  Ureteroscopy-ureter normal in appearance without calculi or mucosal abnormalities.  The previous identified left proximal ureteral calculus was not present and on fluoroscopy had migrated back to a midpole calyx.  Based on ureteroscopy and fluoroscopy findings the following stones were treated:  8 mm midpole calculus-dusted w/laser  7 mm midpole calculus-dusted w/laser   6 mm lower pole calculus-dusted w/laser   6 mm upper pole calculus-dusted w/laser   3 mm lower pole calculus-basket extraction  3 mm midpole calculi x2-basket extraction  4 mm midpole calculus-basket extraction  3 mm upper pole calculus-basket extr  2 mm upper pole calculus-basket extr  EBL: Minimal  Specimens: 1. Calculus fragments for analysis   Indication: Luis Strong is a 75 y.o. with a history of recurrent stone disease.  Recent renal ultrasound showed mild left hydronephrosis with a 8 mm proximal ureteral calculus on KUB.  Multiple renal calculi were also present on ultrasound and KUB. After reviewing the management options for treatment, the patient elected to proceed with the above surgical procedure(s). We have discussed the potential benefits and risks of the procedure, side effects of the proposed treatment, the likelihood of  the patient achieving the goals of the procedure, and any potential problems that might occur during the procedure or recuperation. Informed consent has been obtained.  Description of procedure:  The patient was taken to the operating room and general anesthesia was induced.  The patient was placed in the dorsal lithotomy position, prepped and draped in the usual sterile fashion, and preoperative antibiotics were administered. A preoperative time-out was performed.   A 21 French cystoscope was lubricated and passed per urethra. The cystoscope was advanced proximally under direct vision with findings as described above.  A 0.038 Sensor wire was advanced through the cystoscope and into the left ureteral orifice up to the renal pelvis under fluoroscopic guidance.  The previously noted proximal ureteral calculus was not seen on fluoroscopy.  A 4.5 French semirigid ureteroscope was then passed per urethra and the left ureteral orifice was easily engaged and advanced to the upper proximal ureter with findings as described above  A second Sensor wire was placed to the ureteroscope and the semirigid ureteroscope was removed.  A 12/14 French ureteral access sheath was placed over the working wire under fluoroscopic guidance without difficulty.  A single channel digital flexible ureteroscope was placed through the access sheath and advanced into the renal pelvis without difficulty. Pyeloscopy was performed with findings as described above  A 242 micron holmium laser fiber was placed through the ureteroscope and the larger calculi were treated as above at settings of 0.2J/20Hz  and increased to 0.2J/40hz .  Fragments from the larger calculi measuring >1 mm were removed with a 1.9 [place] nitinol basket.  Once the larger stones were treated the smaller calculi were removed with the basket as described above.    Retrograde pyelogram was performed through the ureteroscope with findings as described above.  Each  calyx was sequentially examined under fluoroscopic guidance and no significant size fragments were identified.  The ureteral access sheath and ureteroscope were removed in tandem and the ureter showed no evidence of injury or perforation.  A 6 FR/24 CM Contour stent was placed under fluoroscopic guidance.  The wire was then removed with an adequate stent curl noted in the upper pole calyx as well as in the bladder.  The bladder was then emptied and the procedure ended.  The patient appeared to tolerate the procedure well and without complications.  After anesthetic reversal the patient was transported to the PACU in stable condition.   Plan:  Follow-up appointment 1 week for cystoscopy with stent removal  John Giovanni, MD

## 2021-02-03 NOTE — Telephone Encounter (Signed)
APP MADE PATIENT IS AWARE  Luis Strong

## 2021-02-03 NOTE — Discharge Instructions (Signed)
DISCHARGE INSTRUCTIONS FOR KIDNEY STONE/URETERAL STENT   MEDICATIONS:  1. Resume all your other meds from home.  2.  AZO (over-the-counter) can help with the burning/stinging when you urinate. 3.  Oxybutynin is for bladder/stent irritation, Rx was sent to your pharmacy.  ACTIVITY:  1. May resume regular activities in 24 hours. 2. No driving while on narcotic pain medications  3. Drink plenty of water  4. Continue to walk at home - you can still get blood clots when you are at home, so keep active, but don't over do it.  5. May return to work/school tomorrow or when you feel ready    SIGNS/SYMPTOMS TO CALL:  Please call us if you have a fever greater than 101.5, uncontrolled nausea/vomiting, uncontrolled pain, dizziness, unable to urinate, excessively bloody urine, chest pain, shortness of breath, leg swelling, leg pain, or any other concerns or questions.   Common postoperative symptoms include urinary frequency, urgency, bladder spasm and blood in the urine  You can reach [place] at [phone].   FOLLOW-UP:  1. You will be contacted by our office for an appointment for stent removal in approximately 1 week  [place]   1) The drugs that you were given will stay in your system until tomorrow so for the next 24 hours you should not:  A) Drive an automobile B) Make any legal decisions C) Drink any alcoholic beverage   2) You may resume regular meals tomorrow.  Today it is better to start with liquids and gradually work up to solid foods.  You may eat anything you prefer, but it is better to start with liquids, then soup and crackers, and gradually work up to solid foods.   3) Please notify your doctor immediately if you have any unusual bleeding, trouble breathing, redness and pain at the surgery site, drainage, fever, or pain not relieved by medication.    4) Additional Instructions:        Please contact your physician with any  problems or Same Day Surgery at [phone], Monday through Friday 6 am to 4 pm, or [place] at [facility] number at [phone].

## 2021-02-03 NOTE — Transfer of Care (Signed)
Immediate Anesthesia Transfer of Care Note  Patient: Luis Strong  Procedure(s) Performed: CYSTOSCOPY/URETEROSCOPY/HOLMIUM LASER/STENT PLACEMENT (Left Ureter)  Patient Location: PACU  Anesthesia Type:General  Level of Consciousness: sedated  Airway & Oxygen Therapy: Patient Spontanous Breathing and Patient connected to face mask oxygen  Post-op Assessment: Report given to RN and Post -op Vital signs reviewed and stable  Post vital signs: Reviewed and stable  Last Vitals:  Vitals Value Taken Time  BP 104/57 02/03/21 0938  Temp    Pulse 74 02/03/21 0939  Resp 17 02/03/21 0939  SpO2 97 % 02/03/21 0939  Vitals shown include unvalidated device data.  Last Pain:  Vitals:   02/03/21 0610  PainSc: 0-No pain         Complications: No complications documented.

## 2021-02-03 NOTE — Interval H&P Note (Signed)
History and Physical Interval Note:  The procedure was discussed in detail including the small chance that the upper ureter cannot be accessed with ureteroscope and the stone may not be able to be treated.  He indicated all questions were answered and desires to proceed.  CV: RRR Lungs: Clear  02/03/2021 7:20 AM  Luis Strong  has presented today for surgery, with the diagnosis of left ureteral stone.  The various methods of treatment have been discussed with the patient and family. After consideration of risks, benefits and other options for treatment, the patient has consented to  Procedure(s): CYSTOSCOPY/URETEROSCOPY/HOLMIUM LASER/STENT PLACEMENT (Left) as a surgical intervention.  The patient's history has been reviewed, patient examined, no change in status, stable for surgery.  I have reviewed the patient's chart and labs.  Questions were answered to the patient's satisfaction.     Leonard
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
## 2021-02-05 NOTE — Anesthesia Postprocedure Evaluation (Signed)
Anesthesia Post Note  Patient: KARVER FADDEN  Procedure(s) Performed: CYSTOSCOPY/URETEROSCOPY/HOLMIUM LASER/STENT PLACEMENT (Left Ureter)  Patient location during evaluation: PACU Anesthesia Type: General Level of consciousness: awake and alert Pain management: pain level controlled Vital Signs Assessment: post-procedure vital signs reviewed and stable Respiratory status: spontaneous breathing, nonlabored ventilation and respiratory function stable Cardiovascular status: blood pressure returned to baseline and stable Postop Assessment: no apparent nausea or vomiting Anesthetic complications: no   No complications documented.   Last Vitals:  Vitals:   02/03/21 1005 02/03/21 1015  BP:  (!) 121/56  Pulse: 71 66  Resp: 14 18  Temp: 36.8 C (!) 36.3 C  SpO2: 98% 96%    Last Pain:  Vitals:   02/03/21 1015  TempSrc: Temporal  PainSc: 0-No pain                 Kathryn F Ramsdell

## 2021-02-09 LAB — CALCULI, WITH PHOTOGRAPH (CLINICAL LAB)
Calcium Oxalate Monohydrate: 100 %
Weight Calculi: 231 mg

## 2021-02-10 ENCOUNTER — Encounter: Payer: Self-pay | Admitting: Urology

## 2021-02-10 ENCOUNTER — Other Ambulatory Visit: Payer: Self-pay

## 2021-02-10 ENCOUNTER — Other Ambulatory Visit: Payer: Self-pay | Admitting: Urology

## 2021-02-10 ENCOUNTER — Ambulatory Visit (INDEPENDENT_AMBULATORY_CARE_PROVIDER_SITE_OTHER): Payer: Medicare Other | Admitting: Urology

## 2021-02-10 VITALS — BP 115/65 | HR 57 | Ht 69.0 in | Wt 204.8 lb

## 2021-02-10 DIAGNOSIS — N200 Calculus of kidney: Secondary | ICD-10-CM

## 2021-02-10 DIAGNOSIS — N132 Hydronephrosis with renal and ureteral calculous obstruction: Secondary | ICD-10-CM | POA: Diagnosis not present

## 2021-02-10 MED ORDER — LIDOCAINE HCL URETHRAL/MUCOSAL 2 % EX GEL
1.0000 "application " | Freq: Once | CUTANEOUS | Status: AC
  Administered 2021-02-10: 1 via URETHRAL

## 2021-02-10 NOTE — Patient Instructions (Signed)
Ureteral Stent Implantation, Care After This sheet gives you information about how to care for yourself after your procedure. Your health care provider may also give you more specific instructions. If you have problems or questions, contact your health care provider. What can I expect after the procedure? After the procedure, it is common to have:  Nausea.  Mild pain when you urinate. You may feel this pain in your lower back or lower abdomen. The pain should stop within a few minutes after you urinate. This may last for up to 1 week.  A small amount of blood in your urine for several days. Follow these instructions at home: Medicines  Take over-the-counter and prescription medicines only as told by your health care provider.  If you were prescribed an antibiotic medicine, take it as told by your health care provider. Do not stop taking the antibiotic even if you start to feel better.  Do not drive for 24 hours if you were given a sedative during your procedure.  Ask your health care provider if the medicine prescribed to you requires you to avoid driving or using heavy machinery. Activity  Rest as told by your health care provider.  Avoid sitting for a long time without moving. Get up to take short walks every 1-2 hours. This is important to improve blood flow and breathing. Ask for help if you feel weak or unsteady.  Return to your normal activities as told by your health care provider. Ask your health care provider what activities are safe for you. General instructions  Watch for any blood in your urine. Call your health care provider if the amount of blood in your urine increases.  If you have a catheter: ? Follow instructions from your health care provider about taking care of your catheter and collection bag. ? Do not take baths, swim, or use a hot tub until your health care provider approves. Ask your health care provider if you may take showers. You may only be allowed to  take sponge baths.  Drink enough fluid to keep your urine pale yellow.  Do not use any products that contain nicotine or tobacco, such as cigarettes, e-cigarettes, and chewing tobacco. These can delay healing after surgery. If you need help quitting, ask your health care provider.  Keep all follow-up visits as told by your health care provider. This is important.   Contact a health care provider if:  You have pain that gets worse or does not get better with medicine, especially pain when you urinate.  You have difficulty urinating.  You feel nauseous or you vomit repeatedly during a period of more than 2 days after the procedure. Get help right away if:  Your urine is dark red or has blood clots in it.  You are leaking urine (have incontinence).  The end of the stent comes out of your urethra.  You cannot urinate.  You have sudden, sharp, or severe pain in your abdomen or lower back.  You have a fever.  You have swelling or pain in your legs.  You have difficulty breathing. Summary  After the procedure, it is common to have mild pain when you urinate that goes away within a few minutes after you urinate. This may last for up to 1 week.  Watch for any blood in your urine. Call your health care provider if the amount of blood in your urine increases.  Take over-the-counter and prescription medicines only as told by your health care provider.  Drink enough fluid to keep your urine pale yellow. This information is not intended to replace advice given to you by your health care provider. Make sure you discuss any questions you have with your health care provider. Document Revised: 08/12/2018 Document Reviewed: 08/13/2018 Elsevier Patient Education  2021 [employer].

## 2021-02-10 NOTE — Progress Notes (Signed)
Indications: Patient is 75 y.o., who is s/p ureteroscopic removal of multiple left renal calculi on 02/03/2021.  He had no postoperative problems.  The patient is presenting today for stent removal.  Procedure:  Flexible Cystoscopy with stent removal (32992)  Timeout was performed and the correct patient, procedure and participants were identified.    Description:  The patient was prepped and draped in the usual sterile fashion. Flexible cystosopy was performed.  The stent was visualized, grasped, and removed intact without difficulty. The patient tolerated the procedure well.  A single dose of Bactrim DS was given.  Complications:  None  Plan:  Instructed to call for fever or flank pain post stent removal  Stone analysis 100% calcium oxalate monohydrate  Recommend metabolic evaluation through [facility] 6 months    John Giovanni, MD

## 2021-02-15 ENCOUNTER — Ambulatory Visit: Payer: Medicare Other | Attending: Family Medicine

## 2021-03-13 DIAGNOSIS — N1831 Chronic kidney disease, stage 3a: Secondary | ICD-10-CM | POA: Diagnosis not present

## 2021-03-13 DIAGNOSIS — N281 Cyst of kidney, acquired: Secondary | ICD-10-CM | POA: Diagnosis not present

## 2021-03-13 DIAGNOSIS — R809 Proteinuria, unspecified: Secondary | ICD-10-CM | POA: Diagnosis not present

## 2021-03-13 DIAGNOSIS — I10 Essential (primary) hypertension: Secondary | ICD-10-CM | POA: Diagnosis not present

## 2021-03-17 ENCOUNTER — Ambulatory Visit: Payer: Self-pay | Admitting: Urology

## 2021-04-06 ENCOUNTER — Other Ambulatory Visit: Payer: Self-pay

## 2021-04-06 ENCOUNTER — Encounter: Payer: Self-pay | Admitting: Urology

## 2021-04-06 ENCOUNTER — Ambulatory Visit (INDEPENDENT_AMBULATORY_CARE_PROVIDER_SITE_OTHER): Payer: Medicare Other | Admitting: Urology

## 2021-04-06 NOTE — Progress Notes (Signed)
Patient was scheduled for 24-hour urine results today however it does not look like the request was ever sent to [facility] and the study was not done.  The request was completed today and he will follow-up once he performs the 24 urine and has blood drawn.

## 2021-04-19 ENCOUNTER — Other Ambulatory Visit: Payer: Self-pay

## 2021-04-19 ENCOUNTER — Other Ambulatory Visit: Payer: Medicare Other

## 2021-04-19 DIAGNOSIS — N200 Calculus of kidney: Secondary | ICD-10-CM | POA: Diagnosis not present

## 2021-04-19 LAB — BASIC METABOLIC PANEL
CO2: 20 (ref 13–22)
Chloride: 104 (ref 99–108)
Creatinine: 1.5 — AB (ref 0.6–1.3)
Creatinine: 1.5 — AB (ref 0.6–1.3)
Potassium: 4.2 (ref 3.4–5.3)
Potassium: 4.2 (ref 3.4–5.3)
Sodium: 137 (ref 137–147)
Sodium: 137 (ref 137–147)

## 2021-04-19 LAB — COMPREHENSIVE METABOLIC PANEL
Calcium: 9.1 (ref 8.7–10.7)
Calcium: 9.1 (ref 8.7–10.7)
GFR calc non Af Amer: 49

## 2021-04-20 DIAGNOSIS — L578 Other skin changes due to chronic exposure to nonionizing radiation: Secondary | ICD-10-CM | POA: Diagnosis not present

## 2021-04-20 DIAGNOSIS — Z85828 Personal history of other malignant neoplasm of skin: Secondary | ICD-10-CM | POA: Diagnosis not present

## 2021-04-20 DIAGNOSIS — Z872 Personal history of diseases of the skin and subcutaneous tissue: Secondary | ICD-10-CM | POA: Diagnosis not present

## 2021-05-02 ENCOUNTER — Telehealth: Payer: Self-pay | Admitting: Urology

## 2021-05-02 ENCOUNTER — Other Ambulatory Visit: Payer: Self-pay | Admitting: Urology

## 2021-05-02 NOTE — Telephone Encounter (Signed)
Metabolic evaluation results were reviewed.  The only significant abnormality noted was a low urine volume at 1.5 L.  This increases his chance of stone formation.  It is recommended he increase his water intake to keep urine output ~ 2.5 L/day.  Typically drinking 3 L (approximately 3 quarts) of water per day in addition to his other fluids it is usually enough to produce this output.  His sodium level was also slightly elevated which can contribute to stone formation and would recommend decreasing dietary sodium.  Please schedule follow-up visit with [facility] prior and September 2022

## 2021-05-04 ENCOUNTER — Encounter: Payer: Self-pay | Admitting: *Deleted
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
## 2021-05-15 ENCOUNTER — Ambulatory Visit (INDEPENDENT_AMBULATORY_CARE_PROVIDER_SITE_OTHER): Payer: Medicare Other | Admitting: Internal Medicine

## 2021-05-15 VITALS — BP 131/72 | HR 59 | Resp 18 | Ht 69.0 in | Wt 207.2 lb

## 2021-05-15 DIAGNOSIS — I10 Essential (primary) hypertension: Secondary | ICD-10-CM

## 2021-05-15 DIAGNOSIS — Z9989 Dependence on other enabling machines and devices: Secondary | ICD-10-CM

## 2021-05-15 DIAGNOSIS — Z7189 Other specified counseling: Secondary | ICD-10-CM | POA: Diagnosis not present

## 2021-05-15 DIAGNOSIS — G4733 Obstructive sleep apnea (adult) (pediatric): Secondary | ICD-10-CM | POA: Diagnosis not present

## 2021-05-15 DIAGNOSIS — I63321 Cerebral infarction due to thrombosis of right anterior cerebral artery: Secondary | ICD-10-CM | POA: Diagnosis not present

## 2021-05-15 NOTE — Patient Instructions (Signed)

## 2021-05-26 DIAGNOSIS — Z20822 Contact with and (suspected) exposure to covid-19: Secondary | ICD-10-CM | POA: Diagnosis not present

## 2021-07-17 ENCOUNTER — Ambulatory Visit (INDEPENDENT_AMBULATORY_CARE_PROVIDER_SITE_OTHER): Payer: Medicare Other | Admitting: Family Medicine

## 2021-07-17 ENCOUNTER — Other Ambulatory Visit: Payer: Self-pay

## 2021-07-17 ENCOUNTER — Encounter: Payer: Self-pay | Admitting: Family Medicine

## 2021-07-17 VITALS — BP 120/70 | HR 78 | Ht 69.0 in | Wt 209.0 lb

## 2021-07-17 DIAGNOSIS — E782 Mixed hyperlipidemia: Secondary | ICD-10-CM

## 2021-07-17 DIAGNOSIS — R7401 Elevation of levels of liver transaminase levels: Secondary | ICD-10-CM | POA: Diagnosis not present

## 2021-07-17 DIAGNOSIS — K219 Gastro-esophageal reflux disease without esophagitis: Secondary | ICD-10-CM

## 2021-07-17 DIAGNOSIS — I10 Essential (primary) hypertension: Secondary | ICD-10-CM

## 2021-07-17 DIAGNOSIS — I63321 Cerebral infarction due to thrombosis of right anterior cerebral artery: Secondary | ICD-10-CM

## 2021-07-17 DIAGNOSIS — F4323 Adjustment disorder with mixed anxiety and depressed mood: Secondary | ICD-10-CM

## 2021-07-17 MED ORDER — METOPROLOL SUCCINATE ER 25 MG PO TB24
25.0000 mg | ORAL_TABLET | Freq: Every day | ORAL | 1 refills | Status: DC
Start: 2021-07-17 — End: 2022-02-27

## 2021-07-17 MED ORDER — ROSUVASTATIN CALCIUM 20 MG PO TABS: 20.0000 mg | ORAL_TABLET | Freq: Every day | ORAL | 1 refills | Status: DC

## 2021-07-17 MED ORDER — CITALOPRAM HYDROBROMIDE 40 MG PO TABS: 40.0000 mg | ORAL_TABLET | Freq: Every day | ORAL | 1 refills | Status: DC

## 2021-07-17 MED ORDER — PANTOPRAZOLE SODIUM 40 MG PO TBEC: DELAYED_RELEASE_TABLET | ORAL | 1 refills | Status: DC
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
## 2021-07-18 DIAGNOSIS — N1832 Chronic kidney disease, stage 3b: Secondary | ICD-10-CM | POA: Diagnosis not present

## 2021-07-18 DIAGNOSIS — I10 Essential (primary) hypertension: Secondary | ICD-10-CM | POA: Diagnosis not present

## 2021-07-18 LAB — LIPID PANEL WITH LDL/HDL RATIO
Cholesterol, Total: 139 mg/dL (ref 100–199)
HDL: 37 mg/dL — ABNORMAL LOW (ref >39)
LDL Chol Calc (NIH): 85 mg/dL (ref 0–99)
LDL/HDL Ratio: 2.3 ratio (ref 0.0–3.6)
Triglycerides: 91 mg/dL (ref 0–149)
VLDL Cholesterol Cal: 17 mg/dL (ref 5–40)

## 2021-07-18 LAB — HEPATIC FUNCTION PANEL
ALT: 23 IU/L (ref 0–44)
AST: 34 IU/L (ref 0–40)
Albumin: 4.6 g/dL (ref 3.7–4.7)
Alkaline Phosphatase: 46 IU/L (ref 44–121)
Bilirubin Total: 0.7 mg/dL (ref 0.0–1.2)
Bilirubin, Direct: 0.19 mg/dL (ref 0.00–0.40)
Total Protein: 6.8 g/dL (ref 6.0–8.5)

## 2021-07-18 LAB — GLUCOSE, RANDOM: Glucose: 89 mg/dL (ref 65–99)

## 2021-07-25 ENCOUNTER — Encounter: Payer: Self-pay | Admitting: *Deleted

## 2021-07-25 ENCOUNTER — Telehealth: Payer: Self-pay | Admitting: Urology

## 2021-07-25 NOTE — Telephone Encounter (Signed)
The only significant abnormality on 24 urine study was low urine volume at 1.52 L..  Recommend increasing water intake to keep urine output >2.5 L/day  Blood work showed no significant abnormalities  Follow-up as scheduled

## 2021-08-23 DIAGNOSIS — R202 Paresthesia of skin: Secondary | ICD-10-CM | POA: Diagnosis not present

## 2021-08-23 DIAGNOSIS — R200 Anesthesia of skin: Secondary | ICD-10-CM | POA: Diagnosis not present

## 2021-08-23 DIAGNOSIS — R531 Weakness: Secondary | ICD-10-CM | POA: Diagnosis not present

## 2021-08-23 DIAGNOSIS — Z8673 Personal history of transient ischemic attack (TIA), and cerebral infarction without residual deficits: Secondary | ICD-10-CM | POA: Diagnosis not present

## 2021-09-29 DIAGNOSIS — Z20828 Contact with and (suspected) exposure to other viral communicable diseases: Secondary | ICD-10-CM | POA: Diagnosis not present

## 2021-11-03 ENCOUNTER — Ambulatory Visit: Payer: Medicare Other | Admitting: Urology

## 2021-11-07 ENCOUNTER — Other Ambulatory Visit: Payer: Self-pay | Admitting: *Deleted

## 2021-11-07 DIAGNOSIS — N200 Calculus of kidney: Secondary | ICD-10-CM

## 2021-11-09 ENCOUNTER — Ambulatory Visit (INDEPENDENT_AMBULATORY_CARE_PROVIDER_SITE_OTHER): Payer: Medicare Other | Admitting: Urology

## 2021-11-09 ENCOUNTER — Ambulatory Visit
Admission: RE | Admit: 2021-11-09 | Discharge: 2021-11-09 | Disposition: A | Discharge status: Home / Self Care | Payer: Medicare Other | Attending: Urology | Admitting: Urology

## 2021-11-09 ENCOUNTER — Other Ambulatory Visit: Payer: Self-pay

## 2021-11-09 ENCOUNTER — Ambulatory Visit
Admission: RE | Admit: 2021-11-09 | Discharge: 2021-11-09 | Disposition: A | Discharge status: Home / Self Care | Payer: Medicare Other | Source: Ambulatory Visit | Attending: Urology | Admitting: Urology

## 2021-11-09 ENCOUNTER — Encounter: Payer: Self-pay | Admitting: Urology

## 2021-11-09 VITALS — BP 139/79 | HR 63 | Ht 69.0 in | Wt 215.0 lb

## 2021-11-09 DIAGNOSIS — N200 Calculus of kidney: Secondary | ICD-10-CM | POA: Diagnosis not present
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
## 2021-11-14 ENCOUNTER — Telehealth: Payer: Self-pay | Admitting: *Deleted

## 2021-11-14 DIAGNOSIS — N200 Calculus of kidney: Secondary | ICD-10-CM

## 2021-11-14 NOTE — Telephone Encounter (Signed)
-----   Message from Abbie Sons, MD sent at 11/10/2021  1:55 PM EST ----- On radiology review of his x-ray a possible right ureteral calculus was mentioned.  Recommend scheduling CT for further evaluation.  Let me know if he desires to proceed and will place order

## 2021-11-14 NOTE — Addendum Note (Signed)
Addended by: Abbie Sons on: 11/14/2021 02:44 PM   Modules accepted: Orders

## 2021-11-14 NOTE — Telephone Encounter (Signed)
Spoke with patient and he would like to proceed with CT scan

## 2021-11-14 NOTE — Telephone Encounter (Signed)
Order was entered and will call with results

## 2021-11-20 DIAGNOSIS — I10 Essential (primary) hypertension: Secondary | ICD-10-CM | POA: Diagnosis not present

## 2021-11-20 DIAGNOSIS — N1831 Chronic kidney disease, stage 3a: Secondary | ICD-10-CM | POA: Diagnosis not present

## 2021-11-21 ENCOUNTER — Other Ambulatory Visit: Payer: Self-pay

## 2021-11-21 ENCOUNTER — Ambulatory Visit
Admission: RE | Admit: 2021-11-21 | Discharge: 2021-11-21 | Disposition: A | Discharge status: Home / Self Care | Payer: Medicare Other | Source: Ambulatory Visit | Attending: Urology | Admitting: Urology

## 2021-11-21 DIAGNOSIS — N400 Enlarged prostate without lower urinary tract symptoms: Secondary | ICD-10-CM | POA: Diagnosis not present

## 2021-11-21 DIAGNOSIS — N200 Calculus of kidney: Secondary | ICD-10-CM | POA: Insufficient documentation

## 2021-11-21 DIAGNOSIS — K5730 Diverticulosis of large intestine without perforation or abscess without bleeding: Secondary | ICD-10-CM | POA: Diagnosis not present

## 2021-11-21 DIAGNOSIS — N202 Calculus of kidney with calculus of ureter: Secondary | ICD-10-CM | POA: Diagnosis not present

## 2021-11-24 ENCOUNTER — Encounter: Payer: Self-pay | Admitting: Urology

## 2021-11-27 ENCOUNTER — Other Ambulatory Visit: Payer: Self-pay | Admitting: Urology

## 2021-11-27 DIAGNOSIS — N200 Calculus of kidney: Secondary | ICD-10-CM

## 2021-11-27 DIAGNOSIS — N201 Calculus of ureter: Secondary | ICD-10-CM

## 2021-11-27 NOTE — Progress Notes (Signed)
Surgical Physician Order Form [facility] [place]  * Scheduling expectation :  Per patient preference  *Length of Case: 90 minutes  *Clearance needed: no  *Anticoagulation Instructions: N/A  *Aspirin Instructions: Hold Aspirin  *Post-op visit Date/Instructions:  1 week cysto stent removal  *Diagnosis: Right Nephrolithiasis, right ureteral calculus  *Procedure: right Ureteroscopy w/laser lithotripsy & stent placement (07371)   Additional orders: N/A  -Admit type: OUTpatient  -Anesthesia: General  -VTE Prophylaxis Standing Order SCDs       Other:   -Standing Lab Orders Per Anesthesia    Lab other: UA&Urine Culture  -Standing Test orders EKG/Chest x-ray per Anesthesia       Test other:   - Medications:  Ancef 2gm IV  -Other orders:  N/A

## 2021-12-01 ENCOUNTER — Other Ambulatory Visit
Admission: RE | Admit: 2021-12-01 | Discharge: 2021-12-01 | Disposition: A | Discharge status: Home / Self Care | Payer: Medicare Other | Attending: Urology | Admitting: Urology

## 2021-12-01 ENCOUNTER — Other Ambulatory Visit: Payer: Self-pay

## 2021-12-01 ENCOUNTER — Telehealth: Payer: Self-pay

## 2021-12-01 DIAGNOSIS — N200 Calculus of kidney: Secondary | ICD-10-CM | POA: Insufficient documentation

## 2021-12-01 LAB — URINALYSIS, COMPLETE (UACMP) WITH MICROSCOPIC
Bilirubin Urine: NEGATIVE
Glucose, UA: NEGATIVE mg/dL
Hgb urine dipstick: NEGATIVE
Ketones, ur: NEGATIVE mg/dL
Leukocytes,Ua: NEGATIVE
Nitrite: NEGATIVE
Protein, ur: NEGATIVE mg/dL
Specific Gravity, Urine: 1.01 (ref 1.005–1.030)
pH: 6.5 (ref 5.0–8.0)

## 2021-12-01 NOTE — Telephone Encounter (Signed)
I spoke with Luis Strong. We have discussed possible surgery dates and Tuesday 12/05/2021 was agreed upon by all parties. Patient given information about surgery date, what to expect pre-operatively and post operatively.   We discussed that a Pre-Admission Testing office will be calling to set up the pre-op visit that will take place prior to surgery, and that these appointments are typically done over the phone with a Pre-Admissions RN.   Informed patient that our office will communicate any additional care to be provided after surgery. Patients questions or concerns were discussed during our call. Advised to call our office should there be any additional information, questions or concerns that arise. Patient verbalized understanding.

## 2021-12-01 NOTE — Progress Notes (Signed)
[facility] Posting Form   Surgery Date/Time: Date: 12/05/2021  Surgeon: Dr. John Giovanni, MD  Surgery Location: Day Surgery  Inpt ( No  )   Outpt (Yes)   Obs ( No  )   Diagnosis: N20.0, N20.1 Right Nephrolithiasis, Right Ureteral Stone  -CPT: [id number]  Surgery: Right Ureteroscopy with laser lithotripsy and stent placment  Stop Anticoagulations: HOLD ASA  Cardiac/Medical/Pulmonary Clearance needed: no  *Orders entered into EPIC  Date: 12/01/21   *Case booked in EPIC  Date: 12/01/21  *Notified pt of Surgery: Date: 12/01/21  PRE-OP UA & CX: Yes, obtained at [facility] today.   *Placed into Prior Authorization Work Que Date: 12/01/21   Assistant/laser/rep:No

## 2021-12-03 LAB — URINE CULTURE: Culture: NO GROWTH

## 2021-12-04 MED ORDER — CHLORHEXIDINE GLUCONATE 0.12 % MT SOLN: 15.0000 mL | Freq: Once | OROMUCOSAL | Status: AC

## 2021-12-04 MED ORDER — LACTATED RINGERS IV SOLN: INTRAVENOUS | Status: DC

## 2021-12-04 MED ORDER — ORAL CARE MOUTH RINSE: 15.0000 mL | Freq: Once | OROMUCOSAL | Status: AC

## 2021-12-04 MED ORDER — CEFAZOLIN SODIUM-DEXTROSE 2-4 GM/100ML-% IV SOLN
2.0000 g | INTRAVENOUS | Status: AC
  Administered 2021-12-05: 2 g via INTRAVENOUS

## 2021-12-05 ENCOUNTER — Other Ambulatory Visit: Payer: Self-pay

## 2021-12-05 ENCOUNTER — Ambulatory Visit: Payer: Medicare Other

## 2021-12-05 ENCOUNTER — Ambulatory Visit: Payer: Medicare Other | Admitting: Certified Registered"

## 2021-12-05 ENCOUNTER — Encounter
Admission: RE | Disposition: A | Discharge status: Home / Self Care | Payer: Self-pay | Source: Ambulatory Visit | Attending: Urology

## 2021-12-05 ENCOUNTER — Ambulatory Visit
Admission: RE | Admit: 2021-12-05 | Discharge: 2021-12-05 | Disposition: A | Discharge status: Home / Self Care | Payer: Medicare Other | Source: Ambulatory Visit | Attending: Urology | Admitting: Urology

## 2021-12-05 ENCOUNTER — Encounter: Payer: Self-pay | Admitting: Urology

## 2021-12-05 DIAGNOSIS — N202 Calculus of kidney with calculus of ureter: Secondary | ICD-10-CM | POA: Insufficient documentation

## 2021-12-05 DIAGNOSIS — K219 Gastro-esophageal reflux disease without esophagitis: Secondary | ICD-10-CM | POA: Diagnosis not present

## 2021-12-05 DIAGNOSIS — I2510 Atherosclerotic heart disease of native coronary artery without angina pectoris: Secondary | ICD-10-CM | POA: Diagnosis not present

## 2021-12-05 DIAGNOSIS — Z8673 Personal history of transient ischemic attack (TIA), and cerebral infarction without residual deficits: Secondary | ICD-10-CM | POA: Insufficient documentation

## 2021-12-05 DIAGNOSIS — N200 Calculus of kidney: Secondary | ICD-10-CM

## 2021-12-05 DIAGNOSIS — G4730 Sleep apnea, unspecified: Secondary | ICD-10-CM | POA: Diagnosis not present

## 2021-12-05 DIAGNOSIS — Z9049 Acquired absence of other specified parts of digestive tract: Secondary | ICD-10-CM | POA: Insufficient documentation

## 2021-12-05 DIAGNOSIS — Z955 Presence of coronary angioplasty implant and graft: Secondary | ICD-10-CM | POA: Insufficient documentation

## 2021-12-05 DIAGNOSIS — I129 Hypertensive chronic kidney disease with stage 1 through stage 4 chronic kidney disease, or unspecified chronic kidney disease: Secondary | ICD-10-CM | POA: Diagnosis not present

## 2021-12-05 DIAGNOSIS — N189 Chronic kidney disease, unspecified: Secondary | ICD-10-CM | POA: Diagnosis not present

## 2021-12-05 DIAGNOSIS — G4733 Obstructive sleep apnea (adult) (pediatric): Secondary | ICD-10-CM | POA: Diagnosis not present

## 2021-12-05 DIAGNOSIS — N201 Calculus of ureter: Secondary | ICD-10-CM

## 2021-12-05 HISTORY — PX: CYSTOSCOPY/URETEROSCOPY/HOLMIUM LASER/STENT PLACEMENT: SHX6546

## 2021-12-05 SURGERY — CYSTOSCOPY/URETEROSCOPY/HOLMIUM LASER/STENT PLACEMENT
Anesthesia: General | Laterality: Right

## 2021-12-05 MED ORDER — EPHEDRINE SULFATE 50 MG/ML IJ SOLN
INTRAMUSCULAR | Status: DC | PRN
  Administered 2021-12-05: 10 mg via INTRAVENOUS

## 2021-12-05 MED ORDER — ONDANSETRON HCL 4 MG/2ML IJ SOLN
INTRAMUSCULAR | Status: AC
  Filled 2021-12-05: qty 2

## 2021-12-05 MED ORDER — PROMETHAZINE HCL 25 MG/ML IJ SOLN: 6.2500 mg | INTRAMUSCULAR | Status: DC | PRN

## 2021-12-05 MED ORDER — PROPOFOL 10 MG/ML IV BOLUS
INTRAVENOUS | Status: DC | PRN
  Administered 2021-12-05: 50 mg via INTRAVENOUS
  Administered 2021-12-05: 150 mg via INTRAVENOUS

## 2021-12-05 MED ORDER — DEXAMETHASONE SODIUM PHOSPHATE 10 MG/ML IJ SOLN
INTRAMUSCULAR | Status: DC | PRN
  Administered 2021-12-05: 5 mg via INTRAVENOUS

## 2021-12-05 MED ORDER — CEFAZOLIN SODIUM-DEXTROSE 2-4 GM/100ML-% IV SOLN
INTRAVENOUS | Status: AC
  Filled 2021-12-05: qty 100

## 2021-12-05 MED ORDER — TROSPIUM CHLORIDE 20 MG PO TABS: 20.0000 mg | ORAL_TABLET | Freq: Two times a day (BID) | ORAL | 0 refills | Status: DC | PRN

## 2021-12-05 MED ORDER — PROPOFOL 10 MG/ML IV BOLUS
INTRAVENOUS | Status: AC
  Filled 2021-12-05: qty 40

## 2021-12-05 MED ORDER — LIDOCAINE HCL (PF) 2 % IJ SOLN
INTRAMUSCULAR | Status: AC
  Filled 2021-12-05: qty 5

## 2021-12-05 MED ORDER — DEXAMETHASONE SODIUM PHOSPHATE 10 MG/ML IJ SOLN
INTRAMUSCULAR | Status: AC
  Filled 2021-12-05: qty 1

## 2021-12-05 MED ORDER — SODIUM CHLORIDE 0.9 % IR SOLN
Status: DC | PRN
  Administered 2021-12-05: 1500 mL

## 2021-12-05 MED ORDER — FENTANYL CITRATE (PF) 100 MCG/2ML IJ SOLN
INTRAMUSCULAR | Status: DC | PRN
Start: 2021-12-05 — End: 2021-12-05
  Administered 2021-12-05: 25 ug via INTRAVENOUS
  Administered 2021-12-05: 50 ug via INTRAVENOUS
  Administered 2021-12-05: 25 ug via INTRAVENOUS

## 2021-12-05 MED ORDER — IOHEXOL 180 MG/ML  SOLN
INTRAMUSCULAR | Status: DC | PRN
  Administered 2021-12-05: 20 mL

## 2021-12-05 MED ORDER — FENTANYL CITRATE (PF) 100 MCG/2ML IJ SOLN
INTRAMUSCULAR | Status: AC
  Filled 2021-12-05: qty 2

## 2021-12-05 MED ORDER — CHLORHEXIDINE GLUCONATE 0.12 % MT SOLN
OROMUCOSAL | Status: AC
  Administered 2021-12-05: 15 mL via OROMUCOSAL
  Filled 2021-12-05: qty 15

## 2021-12-05 MED ORDER — TAMSULOSIN HCL 0.4 MG PO CAPS: 0.4000 mg | ORAL_CAPSULE | Freq: Every day | ORAL | 0 refills | Status: DC

## 2021-12-05 MED ORDER — ACETAMINOPHEN 10 MG/ML IV SOLN: 1000.0000 mg | Freq: Once | INTRAVENOUS | Status: DC | PRN

## 2021-12-05 MED ORDER — ONDANSETRON HCL 4 MG/2ML IJ SOLN
INTRAMUSCULAR | Status: DC | PRN
  Administered 2021-12-05: 4 mg via INTRAVENOUS

## 2021-12-05 MED ORDER — OXYCODONE HCL 5 MG PO TABS
5.0000 mg | ORAL_TABLET | Freq: Once | ORAL | Status: AC | PRN
  Administered 2021-12-05: 5 mg via ORAL

## 2021-12-05 MED ORDER — PHENYLEPHRINE HCL (PRESSORS) 10 MG/ML IV SOLN
INTRAVENOUS | Status: DC | PRN
  Administered 2021-12-05: 50 ug via INTRAVENOUS

## 2021-12-05 MED ORDER — OXYCODONE HCL 5 MG/5ML PO SOLN: 5.0000 mg | Freq: Once | ORAL | Status: AC | PRN

## 2021-12-05 MED ORDER — ROCURONIUM BROMIDE 100 MG/10ML IV SOLN
INTRAVENOUS | Status: DC | PRN
  Administered 2021-12-05: 60 mg via INTRAVENOUS

## 2021-12-05 MED ORDER — SUGAMMADEX SODIUM 200 MG/2ML IV SOLN
INTRAVENOUS | Status: DC | PRN
  Administered 2021-12-05: 200 mg via INTRAVENOUS

## 2021-12-05 MED ORDER — OXYCODONE HCL 5 MG PO TABS
ORAL_TABLET | ORAL | Status: AC
  Filled 2021-12-05: qty 1

## 2021-12-05 MED ORDER — EPHEDRINE 5 MG/ML INJ
INTRAVENOUS | Status: AC
  Filled 2021-12-05: qty 5

## 2021-12-05 MED ORDER — LIDOCAINE HCL (CARDIAC) PF 100 MG/5ML IV SOSY
PREFILLED_SYRINGE | INTRAVENOUS | Status: DC | PRN
  Administered 2021-12-05: 80 mg via INTRAVENOUS

## 2021-12-05 MED ORDER — FENTANYL CITRATE (PF) 100 MCG/2ML IJ SOLN: 25.0000 ug | INTRAMUSCULAR | Status: DC | PRN

## 2021-12-05 MED ORDER — ROCURONIUM BROMIDE 10 MG/ML (PF) SYRINGE
PREFILLED_SYRINGE | INTRAVENOUS | Status: AC
  Filled 2021-12-05: qty 10

## 2021-12-05 SURGICAL SUPPLY — 32 items
BAG DRAIN CYSTO-URO LG1000N (MISCELLANEOUS) ×2 IMPLANT
BASKET LASER NITINOL 1.9FR (BASKET) IMPLANT
BASKET ZERO TIP 1.9FR (BASKET) ×1 IMPLANT
BRUSH SCRUB EZ 1% IODOPHOR (MISCELLANEOUS) ×2 IMPLANT
BSKT STON RTRVL 120 1.9FR (BASKET)
BSKT STON RTRVL ZERO TP 1.9FR (BASKET) ×1
CATH URET FLEX-TIP 2 LUMEN 10F (CATHETERS) IMPLANT
CATH URETL OPEN END 6X70 (CATHETERS) IMPLANT
CNTNR SPEC 2.5X3XGRAD LEK (MISCELLANEOUS)
CONT SPEC 4OZ STER OR WHT (MISCELLANEOUS)
CONT SPEC 4OZ STRL OR WHT (MISCELLANEOUS)
CONTAINER SPEC 2.5X3XGRAD LEK (MISCELLANEOUS) IMPLANT
DRAPE UTILITY 15X26 TOWEL STRL (DRAPES) ×2 IMPLANT
GAUZE 4X4 16PLY ~~LOC~~+RFID DBL (SPONGE) ×4 IMPLANT
GLOVE SURG UNDER POLY LF SZ7.5 (GLOVE) ×2 IMPLANT
GOWN STRL REUS W/ TWL LRG LVL3 (GOWN DISPOSABLE) ×1 IMPLANT
GOWN STRL REUS W/ TWL XL LVL3 (GOWN DISPOSABLE) ×1 IMPLANT
GOWN STRL REUS W/TWL LRG LVL3 (GOWN DISPOSABLE) ×2
GOWN STRL REUS W/TWL XL LVL3 (GOWN DISPOSABLE) ×2
GUIDEWIRE STR DUAL SENSOR (WIRE) ×2 IMPLANT
INFUSOR MANOMETER BAG 3000ML (MISCELLANEOUS) ×2 IMPLANT
IV NS IRRIG 3000ML ARTHROMATIC (IV SOLUTION) ×2 IMPLANT
KIT TURNOVER CYSTO (KITS) ×2 IMPLANT
PACK CYSTO AR (MISCELLANEOUS) ×2 IMPLANT
SET CYSTO W/LG BORE CLAMP LF (SET/KITS/TRAYS/PACK) ×2 IMPLANT
SHEATH URETERAL 12FRX35CM (MISCELLANEOUS) IMPLANT
STENT URET 6FRX24 CONTOUR (STENTS) ×1 IMPLANT
STENT URET 6FRX26 CONTOUR (STENTS) IMPLANT
SURGILUBE 2OZ TUBE FLIPTOP (MISCELLANEOUS) ×2 IMPLANT
TRACTIP FLEXIVA PULSE ID 200 (Laser) ×2 IMPLANT
VALVE UROSEAL ADJ ENDO (VALVE) IMPLANT
WATER STERILE IRR 500ML POUR (IV SOLUTION) ×2 IMPLANT

## 2021-12-05 NOTE — Op Note (Signed)
Preoperative diagnosis:  Right proximal ureteral calculus Right nephrolithiasis  Postoperative diagnosis:  Same  Procedure: Right ureteroscopy with stone removal Ureteroscopic laser lithotripsy Right ureteral stent placement 7F/24 cm Right retrograde pyelogram with interpretation Intraoperative fluoroscopy <30 minutes  Surgeon: Abbie Sons, MD  Anesthesia: General  Complications: None  Intraoperative findings:  Cystoscopy-tight anterior urethra which did not preclude passage of 21 French cystoscope.  Mild lateral lobe enlargement.  Moderate elevation of bladder neck with small median lobe.  Bladder mucosa normal in appearance without erythema, solid or papillary lesions.  UOs normal-appearing bilaterally Ureteropyeloscopy-no ureteral mucosal abnormalities.  Multiple calyceal calculi as described below.  Multiple Randall's plaques noted with intra papillary calculi.  The following calculi were treated/removed 5 mm calculus in the lower proximal ureter 5 mm upper calyceal calculus; removed with basket 3 mm upper calyceal calculus; removed with a basket 10 mm midpole calyceal calculus; dusted with laser 10 mm lower calyceal calculus; dusted with laser   EBL: Minimal  Specimens: None  Indication: Luis Strong is a 76 y.o. patient recurrent nephrolithiasis.  He underwent left ureteroscopy May 2022 after a proximal ureteral calculus and the majority of his left-sided calculi were cleared.  On recent follow-up he was noted to have migration of a 5 mm calculus to the proximal ureter and elected ureteroscopic removal with treatment of his renal calculi.  After reviewing the management options for treatment, he elected to proceed with the above surgical procedure(s). We have discussed the potential benefits and risks of the procedure, side effects of the proposed treatment, the likelihood of the patient achieving the goals of the procedure, and any potential problems that might occur  during the procedure or recuperation. Informed consent has been obtained.  Description of procedure:  The patient was taken to the operating room and general anesthesia was induced.  The patient was placed in the dorsal lithotomy position, prepped and draped in the usual sterile fashion, and preoperative antibiotics were administered. A preoperative time-out was performed.   A 21 French cystoscope was lubricated, inserted per urethra and advanced proximally under direct vision with findings as described above.  A 0.038 Sensor wire was placed through the cystoscope and into the right ureteral orifice and advanced to the renal pelvis under fluoroscopic guidance without difficulty.  A 4.5 French semirigid ureteroscope was then passed per urethra.  The scope was advanced into the right UO without difficulty and advanced proximally to the ureteral calculus as described above.  A 242 m holmium laser fiber was then placed through the scope.  The calculus was initially dusted at a setting of 0.2J/40 Hz.  Due to retropulsion it was further fragmented at 0.8J/10 Hz in the fragments removed with a 1.9 French 0 tip nitinol basket.  The ureteroscope was advanced to the proximal ureter and no additional calculi were seen.  A second sensor wire was placed and the ureteroscope was removed.  A 12/14 French ureteral access sheath was advanced over the working wire without difficulty to the level of the UPJ.  A single channel digital flexible ureteroscope was then placed to the access sheath and advanced into the renal pelvis.  Pyeloscopy was performed with findings as described above.  Smaller calculi were placed in the basket and removed without difficulty as described above. The 2 larger calculi were dusted at a setting of 0.3J/80 Hz.  Retrograde pyelogram was performed with ureteroscope.  Each calyx was sequentially examined and no fragments >1 mm were identified.  The ureteral access  sheath and ureteroscope  were removed in tandem and the ureter showed no evidence of injury/perforation.  A 2F/24 cm Contour ureteral stent was then placed under fluoroscopic guidance.  The proximal curl was within an upper pole calyx and the distal curl was well positioned.  The bladder was emptied with cystoscope sheath.  He was transported to the PACU in stable condition after anesthetic reversal.  Plan: Office follow-up 1 week for cystoscopy with stent removal   John Giovanni, MD

## 2021-12-05 NOTE — Discharge Instructions (Addendum)
DISCHARGE INSTRUCTIONS FOR KIDNEY STONE/URETERAL STENT   MEDICATIONS:  1. Resume all your other meds from home.  2.  AZO (over-the-counter) can help with the burning/stinging when you urinate. 3.  Trospium and tamsulosin are for bladder/stent irritation.  Rxs were sent to your pharmacy.  ACTIVITY:  1. May resume regular activities in 24 hours. 2. No driving while on narcotic pain medications  3. Drink plenty of water  4. Continue to walk at home - you can still get blood clots when you are at home, so keep active, but don't over do it.  5. May return to work/school tomorrow or when you feel ready    SIGNS/SYMPTOMS TO CALL:  Common postoperative symptoms include urinary frequency, urgency, bladder spasm and blood in the urine  Please call us if you have a fever greater than 101.5, uncontrolled nausea/vomiting, uncontrolled pain, dizziness, unable to urinate, excessively bloody urine, chest pain, shortness of breath, leg swelling, leg pain, or any other concerns or questions.   You can reach [place] at [phone].   FOLLOW-UP:  1. You we will be contacted for an appointment for stent removal and in approximately 1 week   [place]   The drugs that you were given will stay in your system until tomorrow so for the next 24 hours you should not:  Drive an automobile Make any legal decisions Drink any alcoholic beverage   You may resume regular meals tomorrow.  Today it is better to start with liquids and gradually work up to solid foods.  You may eat anything you prefer, but it is better to start with liquids, then soup and crackers, and gradually work up to solid foods.   Please notify your doctor immediately if you have any unusual bleeding, trouble breathing, redness and pain at the surgery site, drainage, fever, or pain not relieved by medication.    Additional Instructions: Please contact your physician with any problems or Same Day Surgery  at [phone], Monday through Friday 6 am to 4 pm, or [place] at [facility] number at [phone].

## 2021-12-05 NOTE — Interval H&P Note (Signed)
History and Physical Interval Note:  Followed for nonobstructing renal calculi.  On recent office visit noted to have a calcification in the vicinity ureter which on CT was nonobstructing proximal ureteral calculus.  Options of trial of passage, ureteroscopy and shockwave lithotripsy were discussed.  He has elected ureteroscopy with an attempt to clear out his right-sided stones in addition to the ureteral calculus.  The procedure was discussed in detail including potential risks of bleeding, infection and ureteral injury.  All questions were answered and he desires to proceed.  CV: RRR Lungs: Clear   12/05/2021 12:37 PM  Luis Strong  has presented today for surgery, with the diagnosis of Right Nephrolithiasis, Right Ureteral Stone.  The various methods of treatment have been discussed with the patient and family. After consideration of risks, benefits and other options for treatment, the patient has consented to  Procedure(s): CYSTOSCOPY/URETEROSCOPY/HOLMIUM LASER/STENT PLACEMENT (Right) as a surgical intervention.  The patient's history has been reviewed, patient examined, no change in status, stable for surgery.  I have reviewed the patient's chart and labs.  Questions were answered to the patient's satisfaction.     [name]

## 2021-12-05 NOTE — Transfer of Care (Signed)
Immediate Anesthesia Transfer of Care Note  Patient: Luis Strong  Procedure(s) Performed: CYSTOSCOPY/URETEROSCOPY/HOLMIUM LASER/STENT PLACEMENT (Right)  Patient Location: PACU  Anesthesia Type:General  Level of Consciousness: awake and alert   Airway & Oxygen Therapy: Patient Spontanous Breathing and Patient connected to face mask oxygen  Post-op Assessment: Report given to RN, Post -op Vital signs reviewed and stable and Patient moving all extremities X 4  Post vital signs: Reviewed and stable  Last Vitals:  Vitals Value Taken Time  BP 132/75 12/05/21 1413  Temp    Pulse 74 12/05/21 1415  Resp 33 12/05/21 1415  SpO2 100 % 12/05/21 1415  Vitals shown include unvalidated device data.  Last Pain:  Vitals:   12/05/21 1120  TempSrc: Temporal  PainSc: 0-No pain         Complications: No notable events documented.

## 2021-12-05 NOTE — Anesthesia Procedure Notes (Signed)
Procedure Name: Intubation Date/Time: 12/05/2021 12:54 PM Performed by: Bea Graff, RN Pre-anesthesia Checklist: Patient identified, Emergency Drugs available, Suction available and Patient being monitored Patient Re-evaluated:Patient Re-evaluated prior to induction Oxygen Delivery Method: Circle system utilized Preoxygenation: Pre-oxygenation with 100% oxygen Induction Type: IV induction Ventilation: Oral airway inserted - appropriate to patient size and Two handed mask ventilation required Laryngoscope Size: McGraph and 4 Grade View: Grade II Tube type: Oral Tube size: 7.5 mm Number of attempts: 1 Airway Equipment and Method: Stylet and Oral airway Placement Confirmation: ETT inserted through vocal cords under direct vision, positive ETCO2 and breath sounds checked- equal and bilateral Secured at: 23 cm Tube secured with: Tape Dental Injury: Teeth and Oropharynx as per pre-operative assessment

## 2021-12-05 NOTE — Progress Notes (Signed)
°   12/05/21 1230  Clinical Encounter Type  Visited With Patient  Visit Type Initial;Pre-op  Spiritual Encounters  Spiritual Needs Prayer   Chaplain engaged in conversation, presence and prayer with the patient prior to procedure.
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
## 2021-12-06 ENCOUNTER — Encounter: Payer: Self-pay | Admitting: Urology

## 2021-12-06 ENCOUNTER — Other Ambulatory Visit: Payer: Self-pay | Admitting: Family Medicine

## 2021-12-06 DIAGNOSIS — K219 Gastro-esophageal reflux disease without esophagitis: Secondary | ICD-10-CM

## 2021-12-06 NOTE — Telephone Encounter (Signed)
Requested Prescriptions  Pending Prescriptions Disp Refills   pantoprazole (PROTONIX) 40 MG tablet [Pharmacy Med Name: PANTOPRAZOLE SODIUM DR TABS 40MG ] 180 tablet 3    Sig: TAKE 1 TABLET TWICE A DAY BEFORE MEALS     Gastroenterology: Proton Pump Inhibitors Passed - 12/06/2021  8:03 PM      Passed - Valid encounter within last 12 months    Recent Outpatient Visits          4 months ago Essential hypertension   [place], Deanna C, MD   10 months ago Essential hypertension   [facility] Juline Patch, MD   1 year ago Need for immunization against influenza   [place], Deanna C, MD   1 year ago Essential hypertension   [place], Deanna C, MD   2 years ago Hospital discharge follow-up   [place], Deanna C, MD      Future Appointments            In 1 month Juline Patch, MD [facility], [place]   In 11 months [place], Ronda Fairly, MD [place]

## 2021-12-06 NOTE — Anesthesia Postprocedure Evaluation (Signed)
Anesthesia Post Note  Patient: Luis Strong  Procedure(s) Performed: CYSTOSCOPY/URETEROSCOPY/HOLMIUM LASER/STENT PLACEMENT (Right)  Patient location during evaluation: PACU Anesthesia Type: General Level of consciousness: combative Pain management: pain level controlled Vital Signs Assessment: post-procedure vital signs reviewed and stable Respiratory status: spontaneous breathing, nonlabored ventilation and respiratory function stable Cardiovascular status: blood pressure returned to baseline and stable Postop Assessment: no apparent nausea or vomiting Anesthetic complications: no   No notable events documented.   Last Vitals:  Vitals:   12/05/21 1445 12/05/21 1507  BP: 122/70 130/71  Pulse: 68 69  Resp:  14  Temp: (!) 36.3 C (!) 36.3 C  SpO2: 97% 99%    Last Pain:  Vitals:   12/05/21 1507  TempSrc: Temporal  PainSc: 0-No pain                 [place] Ouch

## 2021-12-11 ENCOUNTER — Inpatient Hospital Stay
Admission: EM | Admit: 2021-12-11 | Discharge: 2021-12-22 | DRG: 698 | Disposition: A | Discharge status: Home Health | Payer: Medicare Other | Attending: Student in an Organized Health Care Education/Training Program | Admitting: Student in an Organized Health Care Education/Training Program

## 2021-12-11 ENCOUNTER — Emergency Department: Payer: Medicare Other

## 2021-12-11 ENCOUNTER — Other Ambulatory Visit: Payer: Self-pay

## 2021-12-11 DIAGNOSIS — B952 Enterococcus as the cause of diseases classified elsewhere: Secondary | ICD-10-CM | POA: Diagnosis not present

## 2021-12-11 DIAGNOSIS — R0682 Tachypnea, not elsewhere classified: Secondary | ICD-10-CM | POA: Diagnosis not present

## 2021-12-11 DIAGNOSIS — R0603 Acute respiratory distress: Secondary | ICD-10-CM | POA: Diagnosis not present

## 2021-12-11 DIAGNOSIS — Z8711 Personal history of peptic ulcer disease: Secondary | ICD-10-CM | POA: Diagnosis not present

## 2021-12-11 DIAGNOSIS — E871 Hypo-osmolality and hyponatremia: Secondary | ICD-10-CM | POA: Diagnosis present

## 2021-12-11 DIAGNOSIS — R443 Hallucinations, unspecified: Secondary | ICD-10-CM | POA: Diagnosis not present

## 2021-12-11 DIAGNOSIS — A419 Sepsis, unspecified organism: Secondary | ICD-10-CM | POA: Diagnosis not present

## 2021-12-11 DIAGNOSIS — N390 Urinary tract infection, site not specified: Secondary | ICD-10-CM | POA: Diagnosis not present

## 2021-12-11 DIAGNOSIS — F32A Depression, unspecified: Secondary | ICD-10-CM | POA: Diagnosis present

## 2021-12-11 DIAGNOSIS — W19XXXA Unspecified fall, initial encounter: Secondary | ICD-10-CM | POA: Diagnosis present

## 2021-12-11 DIAGNOSIS — D6959 Other secondary thrombocytopenia: Secondary | ICD-10-CM | POA: Diagnosis present

## 2021-12-11 DIAGNOSIS — Z960 Presence of urogenital implants: Secondary | ICD-10-CM | POA: Diagnosis not present

## 2021-12-11 DIAGNOSIS — R7881 Bacteremia: Secondary | ICD-10-CM | POA: Diagnosis not present

## 2021-12-11 DIAGNOSIS — Z91018 Allergy to other foods: Secondary | ICD-10-CM

## 2021-12-11 DIAGNOSIS — I4891 Unspecified atrial fibrillation: Secondary | ICD-10-CM | POA: Diagnosis not present

## 2021-12-11 DIAGNOSIS — A4181 Sepsis due to Enterococcus: Secondary | ICD-10-CM | POA: Diagnosis present

## 2021-12-11 DIAGNOSIS — R7401 Elevation of levels of liver transaminase levels: Secondary | ICD-10-CM

## 2021-12-11 DIAGNOSIS — Z6831 Body mass index (BMI) 31.0-31.9, adult: Secondary | ICD-10-CM

## 2021-12-11 DIAGNOSIS — R6520 Severe sepsis without septic shock: Secondary | ICD-10-CM | POA: Diagnosis not present

## 2021-12-11 DIAGNOSIS — Z79899 Other long term (current) drug therapy: Secondary | ICD-10-CM

## 2021-12-11 DIAGNOSIS — K7200 Acute and subacute hepatic failure without coma: Secondary | ICD-10-CM | POA: Diagnosis present

## 2021-12-11 DIAGNOSIS — E872 Acidosis, unspecified: Secondary | ICD-10-CM | POA: Diagnosis present

## 2021-12-11 DIAGNOSIS — N1832 Chronic kidney disease, stage 3b: Secondary | ICD-10-CM | POA: Diagnosis present

## 2021-12-11 DIAGNOSIS — E669 Obesity, unspecified: Secondary | ICD-10-CM | POA: Diagnosis present

## 2021-12-11 DIAGNOSIS — N281 Cyst of kidney, acquired: Secondary | ICD-10-CM | POA: Diagnosis not present

## 2021-12-11 DIAGNOSIS — E8729 Other acidosis: Secondary | ICD-10-CM | POA: Diagnosis not present

## 2021-12-11 DIAGNOSIS — Y732 Prosthetic and other implants, materials and accessory gastroenterology and urology devices associated with adverse incidents: Secondary | ICD-10-CM | POA: Diagnosis present

## 2021-12-11 DIAGNOSIS — R531 Weakness: Secondary | ICD-10-CM | POA: Diagnosis not present

## 2021-12-11 DIAGNOSIS — T796XXA Traumatic ischemia of muscle, initial encounter: Secondary | ICD-10-CM | POA: Diagnosis not present

## 2021-12-11 DIAGNOSIS — R059 Cough, unspecified: Secondary | ICD-10-CM | POA: Diagnosis not present

## 2021-12-11 DIAGNOSIS — I10 Essential (primary) hypertension: Secondary | ICD-10-CM

## 2021-12-11 DIAGNOSIS — M6282 Rhabdomyolysis: Secondary | ICD-10-CM | POA: Diagnosis present

## 2021-12-11 DIAGNOSIS — G4733 Obstructive sleep apnea (adult) (pediatric): Secondary | ICD-10-CM

## 2021-12-11 DIAGNOSIS — Z7982 Long term (current) use of aspirin: Secondary | ICD-10-CM

## 2021-12-11 DIAGNOSIS — R000 Tachycardia, unspecified: Secondary | ICD-10-CM | POA: Diagnosis not present

## 2021-12-11 DIAGNOSIS — R0689 Other abnormalities of breathing: Secondary | ICD-10-CM | POA: Diagnosis not present

## 2021-12-11 DIAGNOSIS — Z9989 Dependence on other enabling machines and devices: Secondary | ICD-10-CM | POA: Diagnosis not present

## 2021-12-11 DIAGNOSIS — R001 Bradycardia, unspecified: Secondary | ICD-10-CM | POA: Diagnosis not present

## 2021-12-11 DIAGNOSIS — I2510 Atherosclerotic heart disease of native coronary artery without angina pectoris: Secondary | ICD-10-CM | POA: Diagnosis present

## 2021-12-11 DIAGNOSIS — R748 Abnormal levels of other serum enzymes: Secondary | ICD-10-CM | POA: Diagnosis not present

## 2021-12-11 DIAGNOSIS — Z20822 Contact with and (suspected) exposure to covid-19: Secondary | ICD-10-CM | POA: Diagnosis present

## 2021-12-11 DIAGNOSIS — K219 Gastro-esophageal reflux disease without esophagitis: Secondary | ICD-10-CM | POA: Diagnosis present

## 2021-12-11 DIAGNOSIS — R7989 Other specified abnormal findings of blood chemistry: Secondary | ICD-10-CM

## 2021-12-11 DIAGNOSIS — E785 Hyperlipidemia, unspecified: Secondary | ICD-10-CM | POA: Diagnosis present

## 2021-12-11 DIAGNOSIS — N189 Chronic kidney disease, unspecified: Secondary | ICD-10-CM | POA: Diagnosis not present

## 2021-12-11 DIAGNOSIS — I129 Hypertensive chronic kidney disease with stage 1 through stage 4 chronic kidney disease, or unspecified chronic kidney disease: Secondary | ICD-10-CM | POA: Diagnosis present

## 2021-12-11 DIAGNOSIS — T83592A Infection and inflammatory reaction due to indwelling ureteral stent, initial encounter: Principal | ICD-10-CM | POA: Diagnosis present

## 2021-12-11 DIAGNOSIS — Z8719 Personal history of other diseases of the digestive system: Secondary | ICD-10-CM

## 2021-12-11 DIAGNOSIS — Z85828 Personal history of other malignant neoplasm of skin: Secondary | ICD-10-CM

## 2021-12-11 DIAGNOSIS — J90 Pleural effusion, not elsewhere classified: Secondary | ICD-10-CM | POA: Diagnosis not present

## 2021-12-11 DIAGNOSIS — Z955 Presence of coronary angioplasty implant and graft: Secondary | ICD-10-CM

## 2021-12-11 DIAGNOSIS — N170 Acute kidney failure with tubular necrosis: Secondary | ICD-10-CM | POA: Diagnosis present

## 2021-12-11 DIAGNOSIS — Z87442 Personal history of urinary calculi: Secondary | ICD-10-CM

## 2021-12-11 DIAGNOSIS — Z466 Encounter for fitting and adjustment of urinary device: Secondary | ICD-10-CM | POA: Diagnosis not present

## 2021-12-11 DIAGNOSIS — G9341 Metabolic encephalopathy: Secondary | ICD-10-CM | POA: Diagnosis present

## 2021-12-11 DIAGNOSIS — Z8673 Personal history of transient ischemic attack (TIA), and cerebral infarction without residual deficits: Secondary | ICD-10-CM

## 2021-12-11 DIAGNOSIS — Z9049 Acquired absence of other specified parts of digestive tract: Secondary | ICD-10-CM

## 2021-12-11 DIAGNOSIS — K5730 Diverticulosis of large intestine without perforation or abscess without bleeding: Secondary | ICD-10-CM | POA: Diagnosis not present

## 2021-12-11 DIAGNOSIS — N179 Acute kidney failure, unspecified: Secondary | ICD-10-CM

## 2021-12-11 DIAGNOSIS — R945 Abnormal results of liver function studies: Secondary | ICD-10-CM | POA: Diagnosis not present

## 2021-12-11 DIAGNOSIS — G9340 Encephalopathy, unspecified: Secondary | ICD-10-CM | POA: Diagnosis not present

## 2021-12-11 DIAGNOSIS — R202 Paresthesia of skin: Secondary | ICD-10-CM | POA: Diagnosis not present

## 2021-12-11 DIAGNOSIS — N200 Calculus of kidney: Secondary | ICD-10-CM | POA: Diagnosis not present

## 2021-12-11 LAB — COMPREHENSIVE METABOLIC PANEL
ALT: 230 U/L — ABNORMAL HIGH (ref 0–44)
AST: 815 U/L — ABNORMAL HIGH (ref 15–41)
Albumin: 3.8 g/dL (ref 3.5–5.0)
Alkaline Phosphatase: 55 U/L (ref 38–126)
Anion gap: 16 — ABNORMAL HIGH (ref 5–15)
BUN: 55 mg/dL — ABNORMAL HIGH (ref 8–23)
CO2: 17 mmol/L — ABNORMAL LOW (ref 22–32)
Calcium: 8.9 mg/dL (ref 8.9–10.3)
Chloride: 99 mmol/L (ref 98–111)
Creatinine, Ser: 3.64 mg/dL — ABNORMAL HIGH (ref 0.61–1.24)
GFR, Estimated: 17 mL/min — ABNORMAL LOW (ref >60)
Glucose, Bld: 153 mg/dL — ABNORMAL HIGH (ref 70–99)
Potassium: 3.9 mmol/L (ref 3.5–5.1)
Sodium: 132 mmol/L — ABNORMAL LOW (ref 135–145)
Total Bilirubin: 2 mg/dL — ABNORMAL HIGH (ref 0.3–1.2)
Total Protein: 7.3 g/dL (ref 6.5–8.1)

## 2021-12-11 LAB — CBC WITH DIFFERENTIAL/PLATELET
Abs Immature Granulocytes: 0.18 10*3/uL — ABNORMAL HIGH (ref 0.00–0.07)
Basophils Absolute: 0 10*3/uL (ref 0.0–0.1)
Basophils Relative: 0 %
Eosinophils Absolute: 0 10*3/uL (ref 0.0–0.5)
Eosinophils Relative: 0 %
HCT: 49.8 % (ref 39.0–52.0)
Hemoglobin: 16.7 g/dL (ref 13.0–17.0)
Immature Granulocytes: 1 %
Lymphocytes Relative: 3 %
Lymphs Abs: 0.3 10*3/uL — ABNORMAL LOW (ref 0.7–4.0)
MCH: 28.3 pg (ref 26.0–34.0)
MCHC: 33.5 g/dL (ref 30.0–36.0)
MCV: 84.4 fL (ref 80.0–100.0)
Monocytes Absolute: 0.8 10*3/uL (ref 0.1–1.0)
Monocytes Relative: 6 %
Neutro Abs: 12.5 10*3/uL — ABNORMAL HIGH (ref 1.7–7.7)
Neutrophils Relative %: 90 %
Platelets: 91 10*3/uL — ABNORMAL LOW (ref 150–400)
RBC: 5.9 MIL/uL — ABNORMAL HIGH (ref 4.22–5.81)
RDW: 14 % (ref 11.5–15.5)
Smear Review: NORMAL
WBC: 13.8 10*3/uL — ABNORMAL HIGH (ref 4.0–10.5)
nRBC: 0 % (ref 0.0–0.2)

## 2021-12-11 LAB — URINALYSIS, COMPLETE (UACMP) WITH MICROSCOPIC
RBC / HPF: >50 RBC/hpf — ABNORMAL HIGH (ref 0–5)
Specific Gravity, Urine: 1.019 (ref 1.005–1.030)
Squamous Epithelial / HPF: NONE SEEN (ref 0–5)
WBC, UA: >50 WBC/hpf — ABNORMAL HIGH (ref 0–5)

## 2021-12-11 LAB — LACTIC ACID, PLASMA: Lactic Acid, Venous: 2 mmol/L (ref 0.5–1.9)

## 2021-12-11 LAB — RESP PANEL BY RT-PCR (FLU A&B, COVID) ARPGX2
Influenza A by PCR: NEGATIVE
Influenza B by PCR: NEGATIVE
SARS Coronavirus 2 by RT PCR: NEGATIVE

## 2021-12-11 LAB — APTT: aPTT: 29 seconds (ref 24–36)

## 2021-12-11 LAB — PROTIME-INR
INR: 1.2 (ref 0.8–1.2)
Prothrombin Time: 15 seconds (ref 11.4–15.2)

## 2021-12-11 MED ORDER — LACTATED RINGERS IV BOLUS (SEPSIS)
1000.0000 mL | Freq: Once | INTRAVENOUS | Status: AC
  Administered 2021-12-11: 1000 mL via INTRAVENOUS

## 2021-12-11 MED ORDER — SODIUM CHLORIDE 0.9 % IV SOLN
2.0000 g | Freq: Once | INTRAVENOUS | Status: AC
  Administered 2021-12-11: 2 g via INTRAVENOUS
  Filled 2021-12-11: qty 2

## 2021-12-11 MED ORDER — LACTATED RINGERS IV SOLN: INTRAVENOUS | Status: DC

## 2021-12-11 NOTE — ED Provider Notes (Signed)
[facility] Provider Note    Event Date/Time   First MD Initiated Contact with Patient 12/11/21 2102     (approximate)   History   Weakness   HPI  Luis Strong is a 76 y.o. male who presents with weakness.  He states that he feels weak all over.  He recently had a procedure for kidney stone and felt fine the day following however shortly thereafter started to feel "really bad ".  He denies abdominal pain.  No flank pain.  No dysuria reported no vomiting.  No diarrhea     Physical Exam   Triage Vital Signs: ED Triage Vitals  Enc Vitals Group     BP 12/11/21 2102 (!) 154/91     Pulse Rate 12/11/21 2102 (!) 120     Resp 12/11/21 2102 (!) 25     Temp 12/11/21 2102 (!) 102.1 F (38.9 C)     Temp Source 12/11/21 2102 Oral     SpO2 12/11/21 2102 97 %     Weight 12/11/21 2106 95.3 kg (210 lb)     Height 12/11/21 2106 1.753 m (5\' 9" )     Head Circumference --      Peak Flow --      Pain Score 12/11/21 2105 2     Pain Loc --      Pain Edu? --      Excl. in [place]? --     Most recent vital signs: Vitals:   12/11/21 2102  BP: (!) 154/91  Pulse: (!) 120  Resp: (!) 25  Temp: (!) 102.1 F (38.9 C)  SpO2: 97%     General: Awake,  CV:  Good peripheral perfusion.  Tachycardia Resp:  Normal effort.  Clear to auscultation bilaterally Abd:  No distention.  No CVA tenderness Other:  No rash   ED Results / Procedures / Treatments   Labs (all labs ordered are listed, but only abnormal results are displayed) Labs Reviewed  COMPREHENSIVE METABOLIC PANEL - Abnormal; Notable for the following components:      Result Value   Sodium 132 (*)    CO2 17 (*)    Glucose, Bld 153 (*)    BUN 55 (*)    Creatinine, Ser 3.64 (*)    AST 815 (*)    ALT 230 (*)    Total Bilirubin 2.0 (*)    GFR, Estimated 17 (*)    Anion gap 16 (*)    All other components within normal limits  LACTIC ACID, PLASMA - Abnormal; Notable for the following components:   Lactic  Acid, Venous 2.0 (*)    All other components within normal limits  CULTURE, BLOOD (ROUTINE X 2)  CULTURE, BLOOD (ROUTINE X 2)  RESP PANEL BY RT-PCR (FLU A&B, COVID) ARPGX2  URINE CULTURE  PROTIME-INR  CBC WITH DIFFERENTIAL/PLATELET  URINALYSIS, ROUTINE W REFLEX MICROSCOPIC  APTT  URINALYSIS, COMPLETE (UACMP) WITH MICROSCOPIC  LACTIC ACID, PLASMA     EKG     RADIOLOGY Chest x-ray reviewed by me, no acute abnormality    PROCEDURES:  Critical Care performed: yes  CRITICAL CARE Performed by: Lavonia Drafts   Total critical care time: 30 minutes  Critical care time was exclusive of separately billable procedures and treating other patients.  Critical care was necessary to treat or prevent imminent or life-threatening deterioration.  Critical care was time spent personally by me on the following activities: development of treatment plan with patient and/or surrogate as well  as nursing, discussions with consultants, evaluation of patient's response to treatment, examination of patient, obtaining history from patient or surrogate, ordering and performing treatments and interventions, ordering and review of laboratory studies, ordering and review of radiographic studies, pulse oximetry and re-evaluation of patient's condition.   Procedures   MEDICATIONS ORDERED IN ED: Medications  lactated ringers infusion (has no administration in time range)  lactated ringers bolus 1,000 mL (1,000 mLs Intravenous New Bag/Given 12/11/21 2147)    And  lactated ringers bolus 1,000 mL (1,000 mLs Intravenous New Bag/Given 12/11/21 2150)    And  lactated ringers bolus 1,000 mL (has no administration in time range)  ceFEPIme (MAXIPIME) 2 g in sodium chloride 0.9 % 100 mL IVPB (has no administration in time range)     IMPRESSION / MDM / ASSESSMENT AND PLAN / ED COURSE  I reviewed the triage vital signs and the nursing notes.  Patient presents via EMS with tachycardia, fever of 102.1.  Given  recent ureteral instrumentation, concerning for urinary source of sepsis.  COVID or influenza also a possibility.  The patient has no abdominal pain  Code sepsis called and 30 mils per kilogram of IV fluids ordered.  Given presumed urinary source have ordered IV cefepime  Lab work demonstrates an acute on chronic kidney injury with BUN of 55 creatinine of 3.64, his baseline appears to be 1.6  Elevated AST ALT, no hypotension .  Bilirubin is mildly elevated as well, ?  Cholangitis, will send for ultrasound  Lactic acid of 2.0  Consulted with Dr. Erlene Quan of urology who recommends IV antibiotics and admission and they will see the patient in the morning as the patient has a stent so no acute urological intervention is necessary  CT renal stone study reviewed by me, no acute abnormality noted ----------------------------------------- 10:29 PM on 12/11/2021 -----------------------------------------  Pending urinalysis and right upper quadrant ultrasound            FINAL CLINICAL IMPRESSION(S) / ED DIAGNOSES   Final diagnoses:  Sepsis ([place])     Rx / DC Orders   ED Discharge Orders     None        Note:  This document was prepared using Dragon voice recognition software and may include unintentional dictation errors.   Lavonia Drafts, MD 12/11/21 2306

## 2021-12-11 NOTE — ED Notes (Signed)
Lactic 2.0, MD notified

## 2021-12-11 NOTE — Progress Notes (Signed)
PHARMACY -  BRIEF ANTIBIOTIC NOTE   Pharmacy has received consult(s) for cefepime from an ED provider.  The patient's profile has been reviewed for ht/wt/allergies/indication/available labs.    One time order(s) placed for cefepime 2 g  Further antibiotics/pharmacy consults should be ordered by admitting physician if indicated.                       Thank you,  Tawnya Crook, PharmD, BCPS Clinical Pharmacist 12/11/2021 9:34 PM

## 2021-12-11 NOTE — ED Triage Notes (Signed)
Pt c/o generalized weakness, numbness & tingling in bilateral legs that started after surgery. Pt reports he has had a fall since surgery. Pt had surgery for kidney stones a week ago.

## 2021-12-11 NOTE — Sepsis Progress Note (Signed)
Elink following for Sepsis Protocol 

## 2021-12-11 NOTE — ED Notes (Signed)
US at bedside

## 2021-12-11 NOTE — Progress Notes (Signed)
CODE SEPSIS - PHARMACY COMMUNICATION  **Broad Spectrum Antibiotics should be administered within 1 hour of Sepsis diagnosis**  Time Code Sepsis Called/Page Received: 2118  Antibiotics Ordered: cefepime  Time of 1st antibiotic administration: [place], PharmD, BCPS Clinical Pharmacist 12/11/2021 10:22 PM

## 2021-12-12 ENCOUNTER — Inpatient Hospital Stay: Payer: Medicare Other

## 2021-12-12 ENCOUNTER — Inpatient Hospital Stay (HOSPITAL_COMMUNITY)
Admit: 2021-12-12 | Discharge: 2021-12-12 | Disposition: A | Discharge status: Home / Self Care | Payer: Medicare Other | Attending: Student | Admitting: Student

## 2021-12-12 DIAGNOSIS — Z960 Presence of urogenital implants: Secondary | ICD-10-CM

## 2021-12-12 DIAGNOSIS — Z8711 Personal history of peptic ulcer disease: Secondary | ICD-10-CM | POA: Diagnosis not present

## 2021-12-12 DIAGNOSIS — I4891 Unspecified atrial fibrillation: Secondary | ICD-10-CM | POA: Diagnosis not present

## 2021-12-12 DIAGNOSIS — Z9049 Acquired absence of other specified parts of digestive tract: Secondary | ICD-10-CM | POA: Diagnosis not present

## 2021-12-12 DIAGNOSIS — G9341 Metabolic encephalopathy: Secondary | ICD-10-CM

## 2021-12-12 DIAGNOSIS — D6959 Other secondary thrombocytopenia: Secondary | ICD-10-CM | POA: Diagnosis present

## 2021-12-12 DIAGNOSIS — T796XXA Traumatic ischemia of muscle, initial encounter: Secondary | ICD-10-CM | POA: Diagnosis not present

## 2021-12-12 DIAGNOSIS — K7200 Acute and subacute hepatic failure without coma: Secondary | ICD-10-CM

## 2021-12-12 DIAGNOSIS — B952 Enterococcus as the cause of diseases classified elsewhere: Secondary | ICD-10-CM | POA: Diagnosis not present

## 2021-12-12 DIAGNOSIS — Z8719 Personal history of other diseases of the digestive system: Secondary | ICD-10-CM

## 2021-12-12 DIAGNOSIS — R7989 Other specified abnormal findings of blood chemistry: Secondary | ICD-10-CM

## 2021-12-12 DIAGNOSIS — Z9989 Dependence on other enabling machines and devices: Secondary | ICD-10-CM | POA: Diagnosis not present

## 2021-12-12 DIAGNOSIS — Z20822 Contact with and (suspected) exposure to covid-19: Secondary | ICD-10-CM | POA: Diagnosis present

## 2021-12-12 DIAGNOSIS — N179 Acute kidney failure, unspecified: Secondary | ICD-10-CM

## 2021-12-12 DIAGNOSIS — N189 Chronic kidney disease, unspecified: Secondary | ICD-10-CM

## 2021-12-12 DIAGNOSIS — A4181 Sepsis due to Enterococcus: Secondary | ICD-10-CM | POA: Diagnosis present

## 2021-12-12 DIAGNOSIS — R6520 Severe sepsis without septic shock: Secondary | ICD-10-CM | POA: Diagnosis present

## 2021-12-12 DIAGNOSIS — N170 Acute kidney failure with tubular necrosis: Secondary | ICD-10-CM | POA: Diagnosis present

## 2021-12-12 DIAGNOSIS — R0682 Tachypnea, not elsewhere classified: Secondary | ICD-10-CM | POA: Diagnosis present

## 2021-12-12 DIAGNOSIS — G4733 Obstructive sleep apnea (adult) (pediatric): Secondary | ICD-10-CM | POA: Diagnosis present

## 2021-12-12 DIAGNOSIS — I10 Essential (primary) hypertension: Secondary | ICD-10-CM | POA: Diagnosis not present

## 2021-12-12 DIAGNOSIS — R531 Weakness: Secondary | ICD-10-CM | POA: Diagnosis not present

## 2021-12-12 DIAGNOSIS — R7881 Bacteremia: Secondary | ICD-10-CM

## 2021-12-12 DIAGNOSIS — R059 Cough, unspecified: Secondary | ICD-10-CM | POA: Diagnosis not present

## 2021-12-12 DIAGNOSIS — R7401 Elevation of levels of liver transaminase levels: Secondary | ICD-10-CM | POA: Diagnosis not present

## 2021-12-12 DIAGNOSIS — R748 Abnormal levels of other serum enzymes: Secondary | ICD-10-CM

## 2021-12-12 DIAGNOSIS — T83592A Infection and inflammatory reaction due to indwelling ureteral stent, initial encounter: Secondary | ICD-10-CM | POA: Diagnosis present

## 2021-12-12 DIAGNOSIS — I129 Hypertensive chronic kidney disease with stage 1 through stage 4 chronic kidney disease, or unspecified chronic kidney disease: Secondary | ICD-10-CM | POA: Diagnosis present

## 2021-12-12 DIAGNOSIS — E8729 Other acidosis: Secondary | ICD-10-CM | POA: Diagnosis not present

## 2021-12-12 DIAGNOSIS — F32A Depression, unspecified: Secondary | ICD-10-CM | POA: Diagnosis present

## 2021-12-12 DIAGNOSIS — N390 Urinary tract infection, site not specified: Secondary | ICD-10-CM | POA: Diagnosis present

## 2021-12-12 DIAGNOSIS — R0603 Acute respiratory distress: Secondary | ICD-10-CM

## 2021-12-12 DIAGNOSIS — E669 Obesity, unspecified: Secondary | ICD-10-CM | POA: Diagnosis present

## 2021-12-12 DIAGNOSIS — Z85828 Personal history of other malignant neoplasm of skin: Secondary | ICD-10-CM | POA: Diagnosis not present

## 2021-12-12 DIAGNOSIS — Y92009 Unspecified place in unspecified non-institutional (private) residence as the place of occurrence of the external cause: Secondary | ICD-10-CM

## 2021-12-12 DIAGNOSIS — A419 Sepsis, unspecified organism: Secondary | ICD-10-CM

## 2021-12-12 DIAGNOSIS — N1832 Chronic kidney disease, stage 3b: Secondary | ICD-10-CM | POA: Diagnosis present

## 2021-12-12 DIAGNOSIS — E871 Hypo-osmolality and hyponatremia: Secondary | ICD-10-CM | POA: Diagnosis present

## 2021-12-12 DIAGNOSIS — Z91018 Allergy to other foods: Secondary | ICD-10-CM | POA: Diagnosis not present

## 2021-12-12 DIAGNOSIS — E872 Acidosis, unspecified: Secondary | ICD-10-CM | POA: Diagnosis present

## 2021-12-12 DIAGNOSIS — W19XXXA Unspecified fall, initial encounter: Secondary | ICD-10-CM

## 2021-12-12 DIAGNOSIS — M6282 Rhabdomyolysis: Secondary | ICD-10-CM | POA: Diagnosis present

## 2021-12-12 DIAGNOSIS — Y732 Prosthetic and other implants, materials and accessory gastroenterology and urology devices associated with adverse incidents: Secondary | ICD-10-CM | POA: Diagnosis present

## 2021-12-12 DIAGNOSIS — R443 Hallucinations, unspecified: Secondary | ICD-10-CM | POA: Diagnosis not present

## 2021-12-12 LAB — CBC
HCT: 43.7 % (ref 39.0–52.0)
Hemoglobin: 14.5 g/dL (ref 13.0–17.0)
MCH: 28.5 pg (ref 26.0–34.0)
MCHC: 33.2 g/dL (ref 30.0–36.0)
MCV: 85.9 fL (ref 80.0–100.0)
Platelets: 88 10*3/uL — ABNORMAL LOW (ref 150–400)
RBC: 5.09 MIL/uL (ref 4.22–5.81)
RDW: 14.1 % (ref 11.5–15.5)
WBC: 11.6 10*3/uL — ABNORMAL HIGH (ref 4.0–10.5)
nRBC: 0 % (ref 0.0–0.2)

## 2021-12-12 LAB — PROTIME-INR
INR: 1.2 (ref 0.8–1.2)
Prothrombin Time: 15.2 seconds (ref 11.4–15.2)

## 2021-12-12 LAB — BLOOD CULTURE ID PANEL (REFLEXED) - BCID2

## 2021-12-12 LAB — COMPREHENSIVE METABOLIC PANEL
ALT: 207 U/L — ABNORMAL HIGH (ref 0–44)
AST: 618 U/L — ABNORMAL HIGH (ref 15–41)
Albumin: 3.2 g/dL — ABNORMAL LOW (ref 3.5–5.0)
Alkaline Phosphatase: 41 U/L (ref 38–126)
Anion gap: 14 (ref 5–15)
BUN: 65 mg/dL — ABNORMAL HIGH (ref 8–23)
CO2: 19 mmol/L — ABNORMAL LOW (ref 22–32)
Calcium: 8.4 mg/dL — ABNORMAL LOW (ref 8.9–10.3)
Chloride: 102 mmol/L (ref 98–111)
Creatinine, Ser: 4.16 mg/dL — ABNORMAL HIGH (ref 0.61–1.24)
GFR, Estimated: 14 mL/min — ABNORMAL LOW (ref >60)
Glucose, Bld: 130 mg/dL — ABNORMAL HIGH (ref 70–99)
Potassium: 4.7 mmol/L (ref 3.5–5.1)
Sodium: 135 mmol/L (ref 135–145)
Total Bilirubin: 1.3 mg/dL — ABNORMAL HIGH (ref 0.3–1.2)
Total Protein: 6.4 g/dL — ABNORMAL LOW (ref 6.5–8.1)

## 2021-12-12 LAB — CORTISOL-AM, BLOOD: Cortisol - AM: 36.4 ug/dL — ABNORMAL HIGH (ref 6.7–22.6)

## 2021-12-12 LAB — AMMONIA: Ammonia: 28 umol/L (ref 9–35)

## 2021-12-12 LAB — BRAIN NATRIURETIC PEPTIDE: B Natriuretic Peptide: 208.5 pg/mL — ABNORMAL HIGH (ref 0.0–100.0)

## 2021-12-12 LAB — CK: Total CK: 31396 U/L — ABNORMAL HIGH (ref 49–397)

## 2021-12-12 LAB — LACTIC ACID, PLASMA
Lactic Acid, Venous: 2 mmol/L (ref 0.5–1.9)
Lactic Acid, Venous: 1.8 mmol/L (ref 0.5–1.9)

## 2021-12-12 LAB — MAGNESIUM: Magnesium: 2.4 mg/dL (ref 1.7–2.4)

## 2021-12-12 LAB — PROCALCITONIN: Procalcitonin: 7.92 ng/mL

## 2021-12-12 MED ORDER — SODIUM CHLORIDE 0.9 % IV SOLN: INTRAVENOUS | Status: DC

## 2021-12-12 MED ORDER — CARBOXYMETHYLCELLUL-GLYCERIN 0.5-0.9 % OP SOLN
1.0000 [drp] | Freq: Three times a day (TID) | OPHTHALMIC | Status: DC | PRN
  Filled 2021-12-12: qty 15

## 2021-12-12 MED ORDER — HYDROCODONE-ACETAMINOPHEN 5-325 MG PO TABS: 1.0000 | ORAL_TABLET | ORAL | Status: DC | PRN

## 2021-12-12 MED ORDER — DARIFENACIN HYDROBROMIDE ER 7.5 MG PO TB24
7.5000 mg | ORAL_TABLET | Freq: Every day | ORAL | Status: DC
  Administered 2021-12-12 – 2021-12-19 (×8): 7.5 mg via ORAL
  Filled 2021-12-12 (×9): qty 1

## 2021-12-12 MED ORDER — SODIUM CHLORIDE 0.9 % IV SOLN: 2.0000 g | INTRAVENOUS | Status: DC

## 2021-12-12 MED ORDER — SODIUM CHLORIDE 0.9 % IV SOLN
2.0000 g | Freq: Three times a day (TID) | INTRAVENOUS | Status: DC
  Administered 2021-12-12 – 2021-12-22 (×30): 2 g via INTRAVENOUS
  Filled 2021-12-12: qty 2
  Filled 2021-12-12 (×2): qty 2000
  Filled 2021-12-12: qty 2
  Filled 2021-12-12 (×2): qty 2000
  Filled 2021-12-12 (×3): qty 2
  Filled 2021-12-12: qty 2000
  Filled 2021-12-12: qty 2
  Filled 2021-12-12: qty 2000
  Filled 2021-12-12: qty 2
  Filled 2021-12-12 (×3): qty 2000
  Filled 2021-12-12: qty 2
  Filled 2021-12-12: qty 2000
  Filled 2021-12-12 (×3): qty 2
  Filled 2021-12-12 (×4): qty 2000
  Filled 2021-12-12 (×3): qty 2
  Filled 2021-12-12: qty 2000
  Filled 2021-12-12 (×2): qty 2
  Filled 2021-12-12: qty 2000
  Filled 2021-12-12: qty 2

## 2021-12-12 MED ORDER — DILTIAZEM LOAD VIA INFUSION: 15.0000 mg | Freq: Once | INTRAVENOUS | Status: DC

## 2021-12-12 MED ORDER — CITALOPRAM HYDROBROMIDE 20 MG PO TABS
40.0000 mg | ORAL_TABLET | Freq: Every day | ORAL | Status: DC
  Administered 2021-12-13 – 2021-12-22 (×10): 40 mg via ORAL
  Filled 2021-12-12 (×10): qty 2

## 2021-12-12 MED ORDER — ACETAMINOPHEN 650 MG RE SUPP: 650.0000 mg | Freq: Four times a day (QID) | RECTAL | Status: DC | PRN

## 2021-12-12 MED ORDER — METOPROLOL SUCCINATE ER 50 MG PO TB24: 25.0000 mg | ORAL_TABLET | Freq: Every day | ORAL | Status: DC

## 2021-12-12 MED ORDER — DILTIAZEM HCL 25 MG/5ML IV SOLN
15.0000 mg | Freq: Once | INTRAVENOUS | Status: AC
  Administered 2021-12-12: 13:00:00 15 mg via INTRAVENOUS
  Filled 2021-12-12: qty 5

## 2021-12-12 MED ORDER — ADULT MULTIVITAMIN W/MINERALS CH
1.0000 | ORAL_TABLET | Freq: Every day | ORAL | Status: DC
  Administered 2021-12-12 – 2021-12-22 (×11): 1 via ORAL
  Filled 2021-12-12 (×11): qty 1

## 2021-12-12 MED ORDER — ACETAMINOPHEN 500 MG PO TABS
1000.0000 mg | ORAL_TABLET | Freq: Once | ORAL | Status: AC
Start: 2021-12-12 — End: 2021-12-12
  Administered 2021-12-12: 1000 mg via ORAL
  Filled 2021-12-12: qty 2

## 2021-12-12 MED ORDER — DILTIAZEM HCL-DEXTROSE 125-5 MG/125ML-% IV SOLN (PREMIX)
5.0000 mg/h | INTRAVENOUS | Status: DC
  Administered 2021-12-12: 16:00:00 5 mg/h via INTRAVENOUS
  Administered 2021-12-13: 01:00:00 10 mg/h via INTRAVENOUS
  Filled 2021-12-12 (×2): qty 125

## 2021-12-12 MED ORDER — ENOXAPARIN SODIUM 30 MG/0.3ML IJ SOSY: 30.0000 mg | PREFILLED_SYRINGE | INTRAMUSCULAR | Status: DC

## 2021-12-12 MED ORDER — METOPROLOL TARTRATE 25 MG PO TABS
25.0000 mg | ORAL_TABLET | Freq: Two times a day (BID) | ORAL | Status: DC
  Administered 2021-12-12 – 2021-12-22 (×20): 25 mg via ORAL
  Filled 2021-12-12 (×20): qty 1

## 2021-12-12 MED ORDER — ACETAMINOPHEN 325 MG PO TABS
650.0000 mg | ORAL_TABLET | Freq: Four times a day (QID) | ORAL | Status: DC | PRN
  Administered 2021-12-16: 650 mg via ORAL
  Filled 2021-12-12: qty 2

## 2021-12-12 MED ORDER — LACTATED RINGERS IV SOLN: INTRAVENOUS | Status: DC

## 2021-12-12 MED ORDER — ROSUVASTATIN CALCIUM 20 MG PO TABS: 20.0000 mg | ORAL_TABLET | Freq: Every day | ORAL | Status: DC

## 2021-12-12 MED ORDER — PANTOPRAZOLE SODIUM 40 MG PO TBEC
40.0000 mg | DELAYED_RELEASE_TABLET | Freq: Every day | ORAL | Status: DC
  Administered 2021-12-12 – 2021-12-20 (×9): 40 mg via ORAL
  Filled 2021-12-12 (×9): qty 1

## 2021-12-12 MED ORDER — DILTIAZEM LOAD VIA INFUSION
15.0000 mg | Freq: Once | INTRAVENOUS | Status: AC
  Administered 2021-12-12: 16:00:00 15 mg via INTRAVENOUS
  Filled 2021-12-12: qty 15

## 2021-12-12 MED ORDER — ONDANSETRON HCL 4 MG/2ML IJ SOLN: 4.0000 mg | Freq: Four times a day (QID) | INTRAMUSCULAR | Status: DC | PRN

## 2021-12-12 MED ORDER — ONDANSETRON HCL 4 MG PO TABS: 4.0000 mg | ORAL_TABLET | Freq: Four times a day (QID) | ORAL | Status: DC | PRN

## 2021-12-12 MED ORDER — ASPIRIN EC 81 MG PO TBEC
81.0000 mg | DELAYED_RELEASE_TABLET | Freq: Every day | ORAL | Status: DC
  Administered 2021-12-13 – 2021-12-22 (×10): 81 mg via ORAL
  Filled 2021-12-12 (×11): qty 1

## 2021-12-12 NOTE — TOC Initial Note (Signed)
Transition of Care [facility]) - Initial/Assessment Note    Patient Details  Name: Luis Strong MRN: [id number] Date of Birth: 04-25-1946  Transition [facility] CM/SW Contact:    Shelbie Hutching, RN Phone Number: 12/12/2021, 12:56 PM  Clinical Narrative:                 Patient being admitted to the hospital with sepsis.  RNCM met with patient at the bedside.  Patient is from home where his son TJ lives with him in [place].  Patient is independent at home, he has a cane that he uses sometimes.  Patient is current with PCP, Dr. Ronnald Ramp in [facility] and his cardiologist is Dr. Nehemiah Massed at [facility].   PT is currently recommending [place] and patient agrees to having home health services set up at discharge.  Patient does not have a preference in agency and agrees with referral to [facility].  [facility]ne Cornea with [facility] accepted referral for RN, PT, and OT.  TOC will cont to follow.   Expected Discharge Plan: [place] Barriers to Discharge: Continued Medical Work up   Patient Goals and CMS Choice Patient states their goals for this hospitalization and ongoing recovery are:: To feel better and get back home CMS Medicare.gov Compare Post Acute Care list provided to:: Patient Choice offered to / list presented to : Patient  Expected Discharge Plan and Services Expected Discharge Plan: [place]   Discharge Planning Services: CM Consult Post Acute Care Choice: [place] arrangements for the past 2 months: Single Family Home                 DME Arranged: N/A DME Agency: NA       HH Arranged: RN, PT, OT [place] Agency: [facility] ([facility]) Date HH Agency Contacted: 12/12/21 Time [place]: 68 Representative spoke with at [place]: Corene Cornea  Prior Living Arrangements/Services Living arrangements for the past 2 months: [place] Lives with:: Adult Children Patient language and need for interpreter reviewed:: Yes Do  you feel safe going back to the place where you live?: Yes      Need for Family Participation in Patient Care: Yes (Comment) (sepsis) Care giver support system in place?: Yes (comment) (sons) Current home services: DME (cane) Criminal Activity/Legal Involvement Pertinent to Current Situation/Hospitalization: No - Comment as needed  Activities of Daily Living      Permission Sought/Granted Permission sought to share information with : Case Manager, Family Supports, Other (comment) Permission granted to share information with : Yes, Verbal Permission Granted  Share Information with NAME: TJ Follette  Permission granted to share info w AGENCY: [facility]  Permission granted to share info w Relationship: son  Permission granted to share info w Contact Information: [phone]  Emotional Assessment Appearance:: Appears stated age Attitude/Demeanor/Rapport: Engaged Affect (typically observed): Accepting Orientation: : Oriented to Self, Oriented to Place, Oriented to  Time, Oriented to Situation Alcohol / Substance Use: Not Applicable Psych Involvement: No (comment)  Admission diagnosis:  Severe sepsis ([place]) [A41.9, R65.20] Patient Active Problem List   Diagnosis Date Noted   Complicated UTI (urinary tract infection) 12/12/2021   Abnormal LFTs 12/12/2021   Acute kidney injury superimposed on CKD lllb ([place]) 12/12/2021   History of GI bleed 12/12/2021   History of peptic ulcer 12/12/2021   S/P cystoscopy with ureteral stent placement 12/12/2021   High anion gap metabolic acidosis 66/04/3015   OSA on CPAP 05/15/2021  CPAP use counseling 05/15/2021   Cerebrovascular accident (CVA) due to thrombosis of right anterior cerebral artery ([place]) 08/04/2019   Melena    Ulcer of esophagus without bleeding    GERD (gastroesophageal reflux disease) 07/09/2019   GI bleed 07/09/2019   GI bleeding 07/09/2019   Left sided numbness 01/05/2019   Left-sided weakness 01/05/2019   Adjustment  disorder with mixed anxiety and depressed mood 06/26/2018   Severe sepsis ([place]) 06/23/2018   Screening for colorectal cancer    Benign neoplasm of cecum    Benign neoplasm of descending colon    Stage 3 chronic kidney disease ([place]) 10/08/2016   Abnormal cardiovascular stress test 11/15/2015   Combined fat and carbohydrate induced hyperlipemia 11/15/2015   HTN (hypertension) 11/16/2014   Arteriosclerosis of coronary artery 11/16/2014   Chest pain 11/03/2014   MI (mitral incompetence) 04/20/2014   PCP:  Juline Patch, MD Pharmacy:   [facility], [address] [place] [address] Phone: [phone] Fax: [phone]  [facility] [place], [place] [facility] [address] AT [address] [place] [facility] [place] [address] Phone: [phone] Fax: [phone]     Social Determinants of Health (SDOH) Interventions    Readmission Risk Interventions No flowsheet data found.

## 2021-12-12 NOTE — ED Notes (Signed)
This RN went into this pt's room upon receiving ampicillin from pharmacy for this pt. 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 MD Cyndia Skeeters messaged at this time regarding pt's condition.
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
## 2021-12-12 NOTE — Evaluation (Signed)
Occupational Therapy Evaluation Patient Details Name: Luis Strong MRN: [id number] DOB: Nov 29, 1945 Today's Date: 12/12/2021   History of Present Illness Luis Strong is a 76 y.o. male with medical history significant for CKD 3B, HTN, CAD, CVA, OSA on CPAP, history of GI bleed 2020 with nonbleeding esophageal ulcer on EGD, nephrolithiasis s/p ureteroscopy with bilateral J stent placement on 1/17, who presents to the ED with generalized malaise and fever starting 2 days after his procedure.  He denies abdominal pain, nausea or vomiting and denies diarrhea.  Has no cough, shortness of breath or chest pain.   Clinical Impression   Pt seen for OT/PT co-evaluation this date. Upon arrival to room, pt awake and seated upright in ED stretcher shaking, diaphoretic, and reporting fatigue, however agreeable to evaluation, stating that he already feels better than yesterday. At baseline, pt is independent with all ADLs, IADLs, and functional mobility. Pt reports that he drives, goes to the gym every morning, and is a retired Company secretary. Pt currently presents with decreased activity tolerance and balance. This date, pt required MOD A to steady trunk as pt aligned hips with shoulders at EOB during supine>sit transfer. Once seated EOB, pt required SUPERVISION for seated UB dressing and MIN GUARD for donning socks while seated EOB. Of note, SpO2 desat 83% following seated LB dressing while on RA (able to increase to >90% following activity cessation and PLB; RN informed). Following seated rest break, pt required MIN A+2 for sit>stand transfer and MIN A+2 to take lateral steps at EOB. Pt would benefit from additional skilled OT services to maximize return to PLOF and minimize risk of future falls, injury, caregiver burden, and readmission. Anticipate pt will be able to discharge home with [facility] following continued medical management for infection and additional OT services.    Recommendations for follow up therapy  are one component of a multi-disciplinary discharge planning process, led by the attending physician.  Recommendations may be updated based on patient status, additional functional criteria and insurance authorization.   Follow Up Recommendations  Home health OT    Assistance Recommended at Discharge Frequent or constant Supervision/Assistance  Patient can return home with the following A little help with walking and/or transfers;A little help with bathing/dressing/bathroom    Functional Status Assessment  Patient has had a recent decline in their functional status and demonstrates the ability to make significant improvements in function in a reasonable and predictable amount of time.  Equipment Recommendations  BSC/3in1       Precautions / Restrictions Precautions Precautions: Fall Precaution Comments: patient shaky and weak Restrictions Weight Bearing Restrictions: No      Mobility Bed Mobility Overal bed mobility: Needs Assistance Bed Mobility: Supine to Sit, Sit to Supine     Supine to sit: Mod assist Sit to supine: Min assist   General bed mobility comments: Patient very shaky with mobility. Requires increased time and effort as well as mod assistance to align hips with shoulders during supine>sit    Transfers Overall transfer level: Needs assistance Equipment used: 2 person hand held assist Transfers: Sit to/from Stand Sit to Stand: Min assist, +2 physical assistance, From elevated surface           General transfer comment: Requires MIN A+2 to steadying when upright, no physical assistance for clearing hips from bed      Balance Overall balance assessment: Needs assistance Sitting-balance support: No upper extremity supported, Feet unsupported Sitting balance-Leahy Scale: Fair Sitting balance - Comments: Requires MIN GUARD for  donning socks via figure-4 position   Standing balance support: Bilateral upper extremity supported, During functional  activity Standing balance-Leahy Scale: Poor Standing balance comment: requires MIN A+2 via B UE support to take lateral steps at EOB                           ADL either performed or assessed with clinical judgement   ADL Overall ADL's : Needs assistance/impaired                 Upper Body Dressing : Supervision/safety;Sitting Upper Body Dressing Details (indicate cue type and reason): Able to don hospital gown, requiring only supervision for sitting EOB Lower Body Dressing: Min guard;Sitting/lateral leans Lower Body Dressing Details (indicate cue type and reason): To don socks via figure-4 position, requiring only MIN GUARD d/t decreased balance                     Vision Baseline Vision/History: 1 Wears glasses Ability to See in Adequate Light: 0 Adequate Patient Visual Report: No change from baseline              Pertinent Vitals/Pain Pain Assessment Pain Assessment: No/denies pain     Hand Dominance Right   Extremity/Trunk Assessment Upper Extremity Assessment Upper Extremity Assessment: Overall WFL for tasks assessed   Lower Extremity Assessment Lower Extremity Assessment: Overall WFL for tasks assessed   Cervical / Trunk Assessment Cervical / Trunk Assessment: Normal   Communication Communication Communication: No difficulties   Cognition Arousal/Alertness: Awake/alert Behavior During Therapy: WFL for tasks assessed/performed Overall Cognitive Status: Within Functional Limits for tasks assessed                                       General Comments  While on RA, SpO2 desat 83% following seated LB dressing. Able to increase to >90% following activity cessation and PLB. RN informed            Home Living Family/patient expects to be discharged to:: Private residence Living Arrangements: Children Available Help at Discharge: Family;Available PRN/intermittently Type of Home: House Home Access: Level entry      Home Layout: Able to live on main level with bedroom/bathroom     Bathroom Shower/Tub: Occupational psychologist: Standard Bathroom Accessibility: Yes   Home Equipment: Conservation officer, nature (2 wheels);Cane - single point;Shower seat - built in   Additional Comments: has equipment that was his wife's. Does not use      Prior Functioning/Environment Prior Level of Function : Independent/Modified Independent             Mobility Comments: Independent without AD for functional mobility ADLs Comments: Independent with ADLs/IADLs. Pt drives and goes to the gym every morning. Is a retired Public relations account executive Problem List: Decreased activity tolerance;Impaired balance (sitting and/or standing)      OT Treatment/Interventions: Self-care/ADL training;Therapeutic exercise;Energy conservation;DME and/or AE instruction;Therapeutic activities;Patient/family education;Balance training    OT Goals(Current goals can be found in the care plan section) Acute Rehab OT Goals Patient Stated Goal: to regain independence OT Goal Formulation: With patient Time For Goal Achievement: 12/26/21 Potential to Achieve Goals: Good ADL Goals Pt Will Perform Grooming: with min guard assist;standing Pt Will Perform Lower Body Dressing: with supervision;with set-up;sit to/from stand Pt Will Transfer to Toilet: with supervision;stand pivot transfer;bedside  commode  OT Frequency: Min 3X/week    Co-evaluation PT/OT/SLP Co-Evaluation/Treatment: Yes Reason for Co-Treatment: For patient/therapist safety;To address functional/ADL transfers PT goals addressed during session: Mobility/safety with mobility OT goals addressed during session: ADL's and self-care      AM-PAC OT "6 Clicks" Daily Activity     Outcome Measure Help from another person eating meals?: None Help from another person taking care of personal grooming?: A Little Help from another person toileting, which includes using toliet, bedpan, or  urinal?: A Little Help from another person bathing (including washing, rinsing, drying)?: A Lot Help from another person to put on and taking off regular upper body clothing?: A Little Help from another person to put on and taking off regular lower body clothing?: A Little 6 Click Score: 18   End of Session Nurse Communication: Mobility status;Other (comment) (SpO2)  Activity Tolerance: Patient tolerated treatment well Patient left: in bed;with call bell/phone within reach  OT Visit Diagnosis: Unsteadiness on feet (R26.81);History of falling (Z91.81)                Time: 7408-1448 OT Time Calculation (min): 20 min Charges:  OT General Charges $OT Visit: 1 Visit OT Evaluation $OT Eval Moderate Complexity: Benjamin Rincon, OTR/L [facility]

## 2021-12-12 NOTE — ED Notes (Addendum)
Ice packs applied to bilateral under arms & groin area for fever

## 2021-12-12 NOTE — Progress Notes (Addendum)
Subjective Patient went into A. fib with RVR this afternoon.  Received IV Cardizem push with improvement in RVR.  Back in RVR with HR to 140s and 150s.  Also mental status change with brief hallucination.  Continues to report weakness.  He reports some shortness of breath but not acute.  He denies chest pain, palpitation, headache, GI or UTI symptoms.  He is oriented x4 except date.    Objective Today's Vitals   12/12/21 1245 12/12/21 1300 12/12/21 1330 12/12/21 1543  BP:  (!) 141/72 127/81   Pulse:  (!) 142 (!) 121   Resp: 18 (!) 31 (!) 22   Temp:    99.2 F (37.3 C)  TempSrc:    Oral  SpO2: 98% 99% 96%   Weight:      Height:      PainSc:       Body mass index is 31.01 kg/m.;   GENERAL: No apparent distress.  Nontoxic. HEENT: MMM.  Vision and hearing grossly intact.  NECK: Supple.  No apparent JVD.  RESP: 96% on RA.  RR elevated to 26.  Bilateral crackles. CVS: HR ranges from 130-1 150s.  Irregular rhythm.  RRR. Heart sounds normal.  ABD/GI/GU: BS+. Abd soft, NTND.  MSK/EXT:  Moves extremities. No apparent deformity. No edema.  SKIN: no apparent skin lesion or wound NEURO: Awake and alert. Oriented appropriately.  No apparent focal neuro deficit. PSYCH: Calm. Normal affect.   Assessment and plan A. fib with RVR-likely provoked by holding metoprolol, rigorous IV fluid hydration and sepsis. -Start Cardizem infusion -Metoprolol 25 mg twice daily -Check CXR, BNP and echocardiogram. -Optimize electrolytes.  Severe sepsis due to Enterococcus bacteremia -ID on board. -Antibiotics changed to IV ampicillin -Echocardiogram   Acute metabolic encephalopathy: Had an episode of hallucination per RN.  He is now oriented x4 except date but somewhat confused.  Likely due to sepsis.  Possible delirium. -Delirium precaution -Avoid or minimize sedating medications  Respiratory distress: Elevated RR to 26.  Bilateral crackles on exam. -Portable CXR and BNP -Hold IV fluid pending CXR  and BNP.  Addendum Reevaluated patient.  Patient's son at bedside.  Reports fall last night.  Patient denies hitting his head or loss of consciousness.  Feels better.  Encephalopathy seems to have resolved.  HR improved.  No complaints.  Chest x-ray with increased interstitial opacities as below.  No significant cardiomegaly.  BNP slightly elevated likely from RVR versus CHF.  Saturating at 98% on RA.  No respiratory distress other than slight tachypnea.  Ammonia was normal.  -Start normal saline at 125 cc an hour for rhabdomyolysis and AKI. -Continue Cardizem drip.  Transition as able -Doubt indication for anticoagulation unless he remains in A. Fib-likely provoked by infection and missed doses of metoprolol. -Antibiotics per ID -Follow echocardiogram -Recheck CMP in the morning. -Strict intake and output   CRITICAL CARE Performed by: Mercy Riding   Total critical care time: 45 minutes  Critical care time was exclusive of separately billable procedures and treating other patients.  Critical care was necessary to treat or prevent imminent or life-threatening deterioration.  Critical care was time spent personally by me on the following activities: development of treatment plan with patient and/or surrogate as well as nursing, discussions with consultants, evaluation of patient's response to treatment, examination of patient, obtaining history from patient or surrogate, ordering and performing treatments and interventions, ordering and review of laboratory studies, ordering and review of radiographic studies, pulse oximetry and re-evaluation of patient's  condition.

## 2021-12-12 NOTE — ED Notes (Signed)
This RN messaged MD Cyndia Skeeters at this time regarding pt HR appearing to be a-fib RVR in 140-170 since pt got back in bed from bedside commode.

## 2021-12-12 NOTE — ED Notes (Signed)
Pt now able to say they are at the hospital, but still believes telebox is a cellphone and someone has been texting them on it. Pt able to say year, DOB, and name. MD Cyndia Skeeters updated.

## 2021-12-12 NOTE — ED Notes (Signed)
Report received from Vanessa, RN

## 2021-12-12 NOTE — ED Notes (Signed)
Lab called to collect BNP and Ammonia at this time. Fluids stopped per MD gonfa order
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
## 2021-12-12 NOTE — Progress Notes (Addendum)
Pharmacy Antibiotic Note  Luis Strong is a 76 y.o. male admitted on 12/11/2021 with sepsis secondary to bacteremia. He had cystoscopy with laser lithotripsy and stent placement 1/17  (preop Urine cx on 1/13 was no growth)   1/23 blood cultures: 4/4 GPC, BCID = E faecalis without vancomycin resistance  Today,12/12/2021 - renal: AKI on CKD. SCr worsening - CK noted to be 31,000  Plan: After discussion with ID, changing antibiotics to ampicillin.  Start with renally adjusted ampicillin 2gm IV q8h Monitor renal function Follow ID recommendations  Height: 5\' 9"  (175.3 cm) Weight: 95.3 kg (210 lb) IBW/kg (Calculated) : 70.7  Temp (24hrs), Avg:99.9 F (37.7 C), Min:98 F (36.7 C), Max:102.1 F (38.9 C)  Recent Labs  Lab 12/11/21 0045 12/11/21 2113 12/11/21 2114 12/12/21 0719  WBC  --   --  13.8* 11.6*  CREATININE  --   --  3.64* 4.16*  LATICACIDVEN 2.0* 2.0*  --  1.8     Estimated Creatinine Clearance: 17.2 mL/min (A) (by C-G formula based on SCr of 4.16 mg/dL (H)).    Allergies  Allergen Reactions   Capsaicin Itching and Rash    Severe rash and itching.    Antimicrobials this admission: 1/23 Cefepime >> 1/23 1/24 ampicillin >>  Microbiology results: 1/23 BCx: 4/4 GPC, E faecalis 1/23 UCx: Pending   Thank you for allowing pharmacy to be a part of this patients care.  Doreene Eland, PharmD, BCPS, BCIDP Work Cell: [phone] 12/12/2021 1:19 PM

## 2021-12-12 NOTE — Progress Notes (Signed)
PHARMACY - PHYSICIAN COMMUNICATION CRITICAL VALUE ALERT - BLOOD CULTURE IDENTIFICATION (BCID)  Luis Strong is an 76 y.o. male who presented to [facility] on 12/11/2021 with a chief complaint of weakness  Assessment:  Blood cultures from 1/22 with GPC In 2/2 sets, BCID detects E faecalis (No vancomycin resistance).  He had cystoscopy with laser lithotripsy and stent placement on 12/05/21  Name of physician (or Provider) Contacted: Drs Cyndia Skeeters and Delaine Lame  Current antibiotics: vancomycin and cefepime  Changes to prescribed antibiotics recommended:  Recommendations accepted by provider - start Ampicillin and auto ID consult  Results for orders placed or performed during the hospital encounter of 12/11/21  Blood Culture ID Panel (Reflexed) (Collected: 12/11/2021  9:13 PM)  Result Value Ref Range   Enterococcus faecalis DETECTED (A) NOT DETECTED   Enterococcus Faecium NOT DETECTED NOT DETECTED   Listeria monocytogenes NOT DETECTED NOT DETECTED   Staphylococcus species NOT DETECTED NOT DETECTED   Staphylococcus aureus (BCID) NOT DETECTED NOT DETECTED   Staphylococcus epidermidis NOT DETECTED NOT DETECTED   Staphylococcus lugdunensis NOT DETECTED NOT DETECTED   Streptococcus species NOT DETECTED NOT DETECTED   Streptococcus agalactiae NOT DETECTED NOT DETECTED   Streptococcus pneumoniae NOT DETECTED NOT DETECTED   Streptococcus pyogenes NOT DETECTED NOT DETECTED   A.calcoaceticus-baumannii NOT DETECTED NOT DETECTED   Bacteroides fragilis NOT DETECTED NOT DETECTED   Enterobacterales NOT DETECTED NOT DETECTED   Enterobacter cloacae complex NOT DETECTED NOT DETECTED   Escherichia coli NOT DETECTED NOT DETECTED   Klebsiella aerogenes NOT DETECTED NOT DETECTED   Klebsiella oxytoca NOT DETECTED NOT DETECTED   Klebsiella pneumoniae NOT DETECTED NOT DETECTED   Proteus species NOT DETECTED NOT DETECTED   Salmonella species NOT DETECTED NOT DETECTED   Serratia marcescens NOT DETECTED NOT  DETECTED   Haemophilus influenzae NOT DETECTED NOT DETECTED   Neisseria meningitidis NOT DETECTED NOT DETECTED   Pseudomonas aeruginosa NOT DETECTED NOT DETECTED   Stenotrophomonas maltophilia NOT DETECTED NOT DETECTED   Candida albicans NOT DETECTED NOT DETECTED   Candida auris NOT DETECTED NOT DETECTED   Candida glabrata NOT DETECTED NOT DETECTED   Candida krusei NOT DETECTED NOT DETECTED   Candida parapsilosis NOT DETECTED NOT DETECTED   Candida tropicalis NOT DETECTED NOT DETECTED   Cryptococcus neoformans/gattii NOT DETECTED NOT DETECTED   Vancomycin resistance NOT DETECTED NOT DETECTED   Doreene Eland, PharmD, BCPS, BCIDP Work Cell: [phone] 12/12/2021 12:26 PM

## 2021-12-12 NOTE — Progress Notes (Signed)
PROGRESS NOTE  Luis Strong AVW:[id number] DOB: Jun 29, 1946   PCP: Juline Patch, MD  Patient is from: Home.  Occasionally uses cane at baseline.  DOA: 12/11/2021 LOS: 0  Chief complaints:  Chief Complaint  Patient presents with   Weakness     Brief Narrative / Interim history: 76 year old M with PMH of CKD-3B, CAD, CVA, OSA on CPAP, HTN, GI bleed/esophageal ulcer, and nephrolithiasis s/p bilateral J stent on 12/05/2021 presenting with general malaise and fever, and admitted for severe sepsis due to complicated UTI.  Patient's son found him down on the floor and called EMS. CT renal stone study negative for hydronephrosis or obstruction, and the stents at right place.  Cultures obtained.  Patient was started on IV cefepime.  Urology consulted.  Blood culture with GPC.  Vancomycin added.  Subjective: Seen and examined earlier this morning.  No major events overnight of this morning.  No complaints other than generalized weakness.  He denies chest pain, dyspnea, GI or UTI symptoms.  Denies any back pain.  Objective: Vitals:   12/12/21 0730 12/12/21 0745 12/12/21 1000 12/12/21 1115  BP: (!) 172/79  138/76 (!) 143/75  Pulse: 79  (!) 103 (!) 102  Resp: (!) 21  (!) 24 (!) 28  Temp:      TempSrc:      SpO2: 97% 100% 98% 95%  Weight:      Height:        Examination:  GENERAL: No apparent distress.  Nontoxic. HEENT: MMM.  Vision and hearing grossly intact.  NECK: Supple.  No apparent JVD.  RESP:  No IWOB.  Fair aeration bilaterally. CVS:  RRR. Heart sounds normal.  ABD/GI/GU: BS+. Abd soft, NTND.  MSK/EXT:  Moves extremities. No apparent deformity. No edema.  SKIN: no apparent skin lesion or wound NEURO: Awake, alert and oriented appropriately.  No apparent focal neuro deficit. PSYCH: Calm. Normal affect.   Procedures:  None  Microbiology summarized: WGNFA-21 and influenza PCR nonreactive. Blood cultures with GPC Urine culture pending.  Assessment &  Plan: Severe sepsis due to bacteremia and complicated UTI and patient with nephrolithiasis s/p bilateral JJ stent on 12/05/2020: POA.  Had fever, tachycardia, tachypnea, AKI and lactic acidosis.  Culture data as above.  Lactic acidosis resolved.  Pro-Cal elevated to 7.92.  Leukocytosis improving. -Continue IV fluid-increase to 200 cc an hour. -On vancomycin -Continue IV cefepime -Follow cultures -Appreciate help by urology.  AKI/azotemia on CKD-3B: Likely from rhabdomyolysis and sepsis.  CT renal stone study without hydronephrosis or obstruction. Recent Labs    01/24/21 1107 04/19/21 0000 12/11/21 2114 12/12/21 0719  BUN 13  --  55* 65*  CREATININE 1.51* 1.5*   1.5* 3.64* 4.16*  -Monitor urine output -Increase IV fluid to 200 cc an hour.  Anion gap metabolic acidosis: Likely due to renal failure.  Improved. -Continue monitoring  Possible fall at home Rhabdomyolysis: CK elevated to 31,000.  Could be traumatic.  He was found down by his patient's son.  -Increase IV LR to 200 cc an hour -Hold home Crestor. -Monitor -PT/OT eval  Elevated liver enzymes/hyperbilirubinemia: Likely due to rhabdomyolysis.  RUQ [place] without acute finding.  Patient is s/p cholecystectomy.  No GI symptoms.  Improving. -IV fluid as above -Recheck in the morning   Essential hypertension: Normotensive for most part. -Resume home metoprolol\   OSA on CPAP -Continue nightly CPAP   History of GI bleed/esophageal ulcer -Continue home Protonix  Thrombocytopenia: Acute on chronic.  Likely due to sepsis. -  Discontinue subcu Lovenox -SCD for VT prophylaxis  Leukocytosis-bandemia: Likely due to sepsis.  Improved.  Class I obesity Body mass index is 31.01 kg/m.         DVT prophylaxis:  Place and maintain sequential compression device Start: 12/12/21 0752  Code Status: Full code Family Communication: Updated patient's son over the phone. Level of care: Progressive Status is: Inpatient  Remains  inpatient appropriate because: Due to severe sepsis with bacteremia and complicated UTI, rhabdomyolysis,       Consultants:  Urology   Sch Meds:  Scheduled Meds:  aspirin EC  81 mg Oral Daily   citalopram  40 mg Oral Daily   darifenacin  7.5 mg Oral Daily   metoprolol succinate  25 mg Oral QHS   multivitamin with minerals  1 tablet Oral Daily   pantoprazole  40 mg Oral Daily   Continuous Infusions:  ceFEPime (MAXIPIME) IV     lactated ringers     PRN Meds:.acetaminophen **OR** acetaminophen, carboxymethylcellul-glycerin, HYDROcodone-acetaminophen, ondansetron **OR** ondansetron (ZOFRAN) IV  Antimicrobials: Anti-infectives (From admission, onward)    Start     Dose/Rate Route Frequency Ordered Stop   12/12/21 2200  ceFEPIme (MAXIPIME) 2 g in sodium chloride 0.9 % 100 mL IVPB        2 g 200 mL/hr over 30 Minutes Intravenous Every 24 hours 12/12/21 0340 12/16/21 2159   12/11/21 2130  ceFEPIme (MAXIPIME) 2 g in sodium chloride 0.9 % 100 mL IVPB        2 g 200 mL/hr over 30 Minutes Intravenous  Once 12/11/21 2123 12/11/21 2236        I have personally reviewed the following labs and images: CBC: Recent Labs  Lab 12/11/21 2114 12/12/21 0719  WBC 13.8* 11.6*  NEUTROABS 12.5*  --   HGB 16.7 14.5  HCT 49.8 43.7  MCV 84.4 85.9  PLT 91* 88*   BMP &GFR Recent Labs  Lab 12/11/21 2114 12/12/21 0719  NA 132* 135  K 3.9 4.7  CL 99 102  CO2 17* 19*  GLUCOSE 153* 130*  BUN 55* 65*  CREATININE 3.64* 4.16*  CALCIUM 8.9 8.4*  MG  --  2.4   Estimated Creatinine Clearance: 17.2 mL/min (A) (by C-G formula based on SCr of 4.16 mg/dL (H)). Liver & Pancreas: Recent Labs  Lab 12/11/21 2114 12/12/21 0719  AST 815* 618*  ALT 230* 207*  ALKPHOS 55 41  BILITOT 2.0* 1.3*  PROT 7.3 6.4*  ALBUMIN 3.8 3.2*   No results for input(s): LIPASE, AMYLASE in the last 168 hours. No results for input(s): AMMONIA in the last 168 hours. Diabetic: No results for input(s): HGBA1C  in the last 72 hours. No results for input(s): GLUCAP in the last 168 hours. Cardiac Enzymes: Recent Labs  Lab 12/12/21 0719  CKTOTAL 31,396*   No results for input(s): PROBNP in the last 8760 hours. Coagulation Profile: Recent Labs  Lab 12/11/21 2114 12/12/21 0719  INR 1.2 1.2   Thyroid Function Tests: No results for input(s): TSH, T4TOTAL, FREET4, T3FREE, THYROIDAB in the last 72 hours. Lipid Profile: No results for input(s): CHOL, HDL, LDLCALC, TRIG, CHOLHDL, LDLDIRECT in the last 72 hours. Anemia Panel: No results for input(s): VITAMINB12, FOLATE, FERRITIN, TIBC, IRON, RETICCTPCT in the last 72 hours. Urine analysis:    Component Value Date/Time   COLORURINE BROWN (A) 12/11/2021 2227   APPEARANCEUR CLOUDY (A) 12/11/2021 2227   APPEARANCEUR Cloudy (A) 05/23/2016 1112   LABSPEC 1.019 12/11/2021 2227   LABSPEC  1.020 04/18/2012 1323   PHURINE  12/11/2021 2227    TEST NOT REPORTED DUE TO COLOR INTERFERENCE OF URINE PIGMENT   GLUCOSEU (A) 12/11/2021 2227    TEST NOT REPORTED DUE TO COLOR INTERFERENCE OF URINE PIGMENT   GLUCOSEU NEGATIVE 04/18/2012 1323   HGBUR (A) 12/11/2021 2227    TEST NOT REPORTED DUE TO COLOR INTERFERENCE OF URINE PIGMENT   BILIRUBINUR (A) 12/11/2021 2227    TEST NOT REPORTED DUE TO COLOR INTERFERENCE OF URINE PIGMENT   BILIRUBINUR negative 07/04/2018 1015   BILIRUBINUR Negative 05/23/2016 1112   BILIRUBINUR NEGATIVE 04/18/2012 1323   KETONESUR (A) 12/11/2021 2227    TEST NOT REPORTED DUE TO COLOR INTERFERENCE OF URINE PIGMENT   PROTEINUR (A) 12/11/2021 2227    TEST NOT REPORTED DUE TO COLOR INTERFERENCE OF URINE PIGMENT   UROBILINOGEN 0.2 07/04/2018 1015   NITRITE (A) 12/11/2021 2227    TEST NOT REPORTED DUE TO COLOR INTERFERENCE OF URINE PIGMENT   LEUKOCYTESUR (A) 12/11/2021 2227    TEST NOT REPORTED DUE TO COLOR INTERFERENCE OF URINE PIGMENT   LEUKOCYTESUR NEGATIVE 04/18/2012 1323   Sepsis Labs: Invalid input(s): PROCALCITONIN,  [place]  Microbiology: Recent Results (from the past 240 hour(s))  Culture, blood (Routine x 2)     Status: None (Preliminary result)   Collection Time: 12/11/21  9:13 PM   Specimen: BLOOD  Result Value Ref Range Status   Specimen Description BLOOD LEFT FOREARM  Final   Special Requests   Final    BOTTLES DRAWN AEROBIC AND ANAEROBIC Blood Culture adequate volume   Culture  Setup Time   Final    GRAM POSITIVE COCCI IN BOTH AEROBIC AND ANAEROBIC BOTTLES CRITICAL RESULT CALLED TO, READ BACK BY AND VERIFIED WITH: MORGAN HICKS 12/12/21 1205 [place] Performed at [facility], [address] [place], [place] [address]    Culture GRAM POSITIVE COCCI  Final   Report Status PENDING  Incomplete  Culture, blood (Routine x 2)     Status: None (Preliminary result)   Collection Time: 12/11/21  9:13 PM   Specimen: BLOOD  Result Value Ref Range Status   Specimen Description BLOOD RW  Final   Special Requests BOTTLES DRAWN AEROBIC AND ANAEROBIC BCAV  Final   Culture  Setup Time   Final    GRAM POSITIVE COCCI Organism ID to follow IN BOTH AEROBIC AND ANAEROBIC BOTTLES CRITICAL RESULT CALLED TO, READ BACK BY AND VERIFIED WITH: MORGAN HICKS 12/12/21 1205 MW Performed at [facility], [address] [place], [place] [address]    Culture GRAM POSITIVE COCCI  Final   Report Status PENDING  Incomplete  Blood Culture ID Panel (Reflexed)     Status: Abnormal   Collection Time: 12/11/21  9:13 PM  Result Value Ref Range Status   Enterococcus faecalis DETECTED (A) NOT DETECTED Final    Comment: CRITICAL RESULT CALLED TO, READ BACK BY AND VERIFIED WITH: MORGAN HICKS 12/12/21 1206 MW    Enterococcus Faecium NOT DETECTED NOT DETECTED Final   Listeria monocytogenes NOT DETECTED NOT DETECTED Final   Staphylococcus species NOT DETECTED NOT DETECTED Final   Staphylococcus aureus (BCID) NOT DETECTED NOT DETECTED Final   Staphylococcus epidermidis NOT DETECTED NOT DETECTED Final    Staphylococcus lugdunensis NOT DETECTED NOT DETECTED Final   Streptococcus species NOT DETECTED NOT DETECTED Final   Streptococcus agalactiae NOT DETECTED NOT DETECTED Final   Streptococcus pneumoniae NOT DETECTED NOT DETECTED Final   Streptococcus pyogenes NOT DETECTED NOT DETECTED Final  A.calcoaceticus-baumannii NOT DETECTED NOT DETECTED Final   Bacteroides fragilis NOT DETECTED NOT DETECTED Final   Enterobacterales NOT DETECTED NOT DETECTED Final   Enterobacter cloacae complex NOT DETECTED NOT DETECTED Final   Escherichia coli NOT DETECTED NOT DETECTED Final   Klebsiella aerogenes NOT DETECTED NOT DETECTED Final   Klebsiella oxytoca NOT DETECTED NOT DETECTED Final   Klebsiella pneumoniae NOT DETECTED NOT DETECTED Final   Proteus species NOT DETECTED NOT DETECTED Final   Salmonella species NOT DETECTED NOT DETECTED Final   Serratia marcescens NOT DETECTED NOT DETECTED Final   Haemophilus influenzae NOT DETECTED NOT DETECTED Final   Neisseria meningitidis NOT DETECTED NOT DETECTED Final   Pseudomonas aeruginosa NOT DETECTED NOT DETECTED Final   Stenotrophomonas maltophilia NOT DETECTED NOT DETECTED Final   Candida albicans NOT DETECTED NOT DETECTED Final   Candida auris NOT DETECTED NOT DETECTED Final   Candida glabrata NOT DETECTED NOT DETECTED Final   Candida krusei NOT DETECTED NOT DETECTED Final   Candida parapsilosis NOT DETECTED NOT DETECTED Final   Candida tropicalis NOT DETECTED NOT DETECTED Final   Cryptococcus neoformans/gattii NOT DETECTED NOT DETECTED Final   Vancomycin resistance NOT DETECTED NOT DETECTED Final    Comment: Performed at [facility], [address] [place], [place] [address]  Resp Panel by RT-PCR (Flu A&B, Covid) Nasopharyngeal Swab     Status: None   Collection Time: 12/11/21  9:14 PM   Specimen: Nasopharyngeal Swab; Nasopharyngeal(NP) swabs in vial transport medium  Result Value Ref Range Status   SARS Coronavirus 2 by RT PCR NEGATIVE  NEGATIVE Final    Comment: (NOTE) SARS-CoV-2 target nucleic acids are NOT DETECTED.  The SARS-CoV-2 RNA is generally detectable in upper respiratory specimens during the acute phase of infection. The lowest concentration of SARS-CoV-2 viral copies this assay can detect is 138 copies/mL. A negative result does not preclude SARS-Cov-2 infection and should not be used as the sole basis for treatment or other patient management decisions. A negative result may occur with  improper specimen collection/handling, submission of specimen other than nasopharyngeal swab, presence of viral mutation(s) within the areas targeted by this assay, and inadequate number of viral copies(<138 copies/mL). A negative result must be combined with clinical observations, patient history, and epidemiological information. The expected result is Negative.  Fact Sheet for Patients:  EntrepreneurPulse.com.au  Fact Sheet for Healthcare Providers:  IncredibleEmployment.be  This test is no t yet approved or cleared by the [place] FDA and  has been authorized for detection and/or diagnosis of SARS-CoV-2 by FDA under an Emergency Use Authorization (EUA). This EUA will remain  in effect (meaning this test can be used) for the duration of the COVID-19 declaration under Section 564(b)(1) of the Act, 21 U.S.C.section 360bbb-3(b)(1), unless the authorization is terminated  or revoked sooner.       Influenza A by PCR NEGATIVE NEGATIVE Final   Influenza B by PCR NEGATIVE NEGATIVE Final    Comment: (NOTE) The Xpert Xpress SARS-CoV-2/FLU/RSV plus assay is intended as an aid in the diagnosis of influenza from Nasopharyngeal swab specimens and should not be used as a sole basis for treatment. Nasal washings and aspirates are unacceptable for Xpert Xpress SARS-CoV-2/FLU/RSV testing.  Fact Sheet for Patients: EntrepreneurPulse.com.au  Fact Sheet for Healthcare  Providers: IncredibleEmployment.be  This test is not yet approved or cleared by the [place] FDA and has been authorized for detection and/or diagnosis of SARS-CoV-2 by FDA under an Emergency Use Authorization (EUA). This EUA will remain  in effect (meaning this test can be used) for the duration of the COVID-19 declaration under Section 564(b)(1) of the Act, 21 U.S.C. section 360bbb-3(b)(1), unless the authorization is terminated or revoked.  Performed at [facility], [address], [place], [place] [address]     Radiology Studies: [facility] Chest [place] 1 View  Result Date: 12/11/2021 CLINICAL DATA:  Generalized weakness EXAM: PORTABLE CHEST 1 VIEW COMPARISON:  06/23/2018 FINDINGS: Mild bronchitic changes. Streaky atelectasis or scar left base. No consolidation, pleural effusion, or pneumothorax. Cardiomediastinal silhouette within normal limits. Aortic atherosclerosis. IMPRESSION: Mild bronchitic changes with atelectasis or scar at the left base. Electronically Signed   By: Donavan Foil M.D.   On: 12/11/2021 21:40   CT Renal Stone Study  Result Date: 12/11/2021 CLINICAL DATA:  Nephrolithiasis, flank pain, sepsis EXAM: CT ABDOMEN AND PELVIS WITHOUT CONTRAST TECHNIQUE: Multidetector CT imaging of the abdomen and pelvis was performed following the standard protocol without IV contrast. RADIATION DOSE REDUCTION: This exam was performed according to the departmental dose-optimization program which includes automated exposure control, adjustment of the mA and/or kV according to patient size and/or use of iterative reconstruction technique. COMPARISON:  11/21/2021 FINDINGS: Lower chest: The visualized lung bases are clear. Cardiac size within normal limits. No pericardial effusion. Small hiatal hernia. Hepatobiliary: No focal liver abnormality is seen. Status post cholecystectomy. No biliary dilatation. Pancreas: Unremarkable Spleen: Unremarkable Adrenals/Urinary Tract:  The adrenal glands are unremarkable. The kidneys are normal in size and position. Since the prior examination, nonobstructing calculus within the a right ureter has been removed and a double-J ureteral stent is seen in expected position extending from the right upper pole into the urinary bladder. Multiple nonobstructing renal calculi are again seen bilaterally which appear grossly stable in size and position since prior examination with the dominant 7 mm calculus seen within the lower pole the right kidney. Multiple simple cortical cysts are seen bilaterally. No hydronephrosis. No ureteral calculi on the left. The bladder is partially decompressed. Tiny dependently layering 2 mm calcification may represent a recently passed calculus or residua of recent intervention. Stomach/Bowel: Moderate sigmoid diverticulosis. The stomach, small bowel, and large bowel are otherwise unremarkable. No evidence of obstruction focal inflammation. The appendix is normal. Vascular/Lymphatic: Aortic atherosclerosis. No enlarged abdominal or pelvic lymph nodes. Reproductive: The prostate gland is mildly enlarged and indents the base of the bladder. Seminal vesicles are unremarkable. Other: No abdominal wall hernia or abnormality. No abdominopelvic ascites. Musculoskeletal: No acute bone abnormality. No lytic or blastic bone lesion. Degenerative changes are seen within the lumbar spine. IMPRESSION: Interval removal of previously noted right ureteral calculus in placement of a double-J ureteral stent in expected position. No hydronephrosis. Stable superimposed bilateral nonobstructing nephrolithiasis. New tiny calculus within the bladder lumen likely the residua of recent intervention or a recently passed calculus. Moderate sigmoid diverticulosis without superimposed acute inflammatory change. Mild prostatic enlargement. Aortic Atherosclerosis (ICD10-I70.0). Electronically Signed   By: Fidela Salisbury M.D.   On: 12/11/2021 21:55   US  Abdomen Limited RUQ (LIVER/GB)  Result Date: 12/11/2021 CLINICAL DATA:  Elevated liver enzymes. EXAM: ULTRASOUND ABDOMEN LIMITED RIGHT UPPER QUADRANT COMPARISON:  CT abdomen pelvis dated 12/11/2021. FINDINGS: Gallbladder: Cholecystectomy. Common bile duct: Diameter: 6 mm Liver: The liver demonstrates a normal echogenicity. There is a small right liver calcified granuloma. Portal vein is patent on color Doppler imaging with normal direction of blood flow towards the liver. Other: Several cysts noted in the right kidney. IMPRESSION: 1. Cholecystectomy. 2. Right renal cysts. Electronically  Signed   By: Anner Crete M.D.   On: 12/11/2021 23:27     Additional 45 minutes with more than 50% spent in reviewing records, counseling patient/family and coordinating care.  Jerrel Tiberio T. [facility]  If 7PM-7AM, please contact night-coverage [email or web address] 12/12/2021, 12:15 PM

## 2021-12-12 NOTE — Evaluation (Signed)
Physical Therapy Evaluation Patient Details Name: Luis Strong MRN: [id number] DOB: [birth year] Today's Date: 12/12/2021  History of Present Illness  Luis Strong is a 76 y.o. male with medical history significant for CKD 3B, HTN, CAD, CVA, OSA on CPAP, history of GI bleed 2020 with nonbleeding esophageal ulcer on EGD, nephrolithiasis s/p ureteroscopy with bilateral J stent placement on 1/17, who presents to the ED with generalized malaise and fever starting 2 days after his procedure.  He denies abdominal pain, nausea or vomiting and denies diarrhea.  Has no cough, shortness of breath or chest pain.  Clinical Impression  Patient received on stretcher in ED. Reports he is feeling better than when he came in, however is shaky just lying down. Requires mod +2 assist for supine to sit. Min assist for sit to supine. Patient is obviously weak, sob with mobility and activity. O2 saturations varying throughout session. Cues needed for pursed lip breathing and rest breaks. Patient was abl;e to stand at edge of bed with +2 hand held assist and took a couple of side steps. Patient will continue to benefit froms killed PT while here to improve strength, balance and independence with mobility for safe return home. I anticipate that patient will be able to return home once his infection has cleared.          Recommendations for follow up therapy are one component of a multi-disciplinary discharge planning process, led by the attending physician.  Recommendations may be updated based on patient status, additional functional criteria and insurance authorization.  Follow Up Recommendations Home health PT    Assistance Recommended at Discharge Frequent or constant Supervision/Assistance  Patient can return home with the following  A little help with bathing/dressing/bathroom;Assistance with cooking/housework;Assist for transportation;Help with stairs or ramp for entrance;A lot of help with walking and/or  transfers    Equipment Recommendations None recommended by PT  Recommendations for Other Services       Functional Status Assessment Patient has had a recent decline in their functional status and demonstrates the ability to make significant improvements in function in a reasonable and predictable amount of time.     Precautions / Restrictions Precautions Precautions: Fall Precaution Comments: patient shaky and weak Restrictions Weight Bearing Restrictions: No      Mobility  Bed Mobility Overal bed mobility: Needs Assistance Bed Mobility: Supine to Sit, Sit to Supine     Supine to sit: Mod assist Sit to supine: Min assist   General bed mobility comments: Patient very shaky with mobility. Requires increased time and effort as well as mod assistance to get around to edge of stretcher.    Transfers Overall transfer level: Needs assistance Equipment used: 2 person hand held assist Transfers: Sit to/from Stand Sit to Stand: Min assist, +2 physical assistance, From elevated surface                Ambulation/Gait Ambulation/Gait assistance: Min assist, +2 physical assistance, +2 safety/equipment Gait Distance (Feet): 2 Feet Assistive device: 2 person hand held assist Gait Pattern/deviations: Step-to pattern, Decreased step length - right, Decreased step length - left Gait velocity: decr     General Gait Details: able to take a couple of steps at edge of stretcher.  Stairs            Wheelchair Mobility    Modified Rankin (Stroke Patients Only)       Balance Overall balance assessment: Needs assistance Sitting-balance support: No upper extremity supported, Feet unsupported Sitting balance-Leahy Scale:  Poor Sitting balance - Comments: with any challenge patient requires support to maintain sitting balance   Standing balance support: Bilateral upper extremity supported Standing balance-Leahy Scale: Poor Standing balance comment: requires B UE support                              Pertinent Vitals/Pain Pain Assessment Pain Assessment: No/denies pain    Home Living Family/patient expects to be discharged to:: Private residence Living Arrangements: Children Available Help at Discharge: Family;Available PRN/intermittently Type of Home: House Home Access: Level entry       Home Layout: Able to live on main level with bedroom/bathroom Home Equipment: Rolling Walker (2 wheels);Cane - single point Additional Comments: has equipment that was his wife's. Does not use    Prior Function Prior Level of Function : Independent/Modified Independent             Mobility Comments: drives, goes to gym each morning ADLs Comments: independent     Hand Dominance   Dominant Hand: Right    Extremity/Trunk Assessment   Upper Extremity Assessment Upper Extremity Assessment: Defer to OT evaluation    Lower Extremity Assessment Lower Extremity Assessment: Generalized weakness    Cervical / Trunk Assessment Cervical / Trunk Assessment: Normal  Communication   Communication: No difficulties  Cognition Arousal/Alertness: Awake/alert Behavior During Therapy: WFL for tasks assessed/performed Overall Cognitive Status: Within Functional Limits for tasks assessed                                          General Comments      Exercises     Assessment/Plan    PT Assessment Patient needs continued PT services  PT Problem List Decreased strength;Decreased mobility;Decreased activity tolerance;Decreased balance;Decreased knowledge of use of DME       PT Treatment Interventions DME instruction;Therapeutic activities;Gait training;Therapeutic exercise;Patient/family education;Functional mobility training;Stair training;Balance training    PT Goals (Current goals can be found in the Care Plan section)  Acute Rehab PT Goals Patient Stated Goal: to feel better PT Goal Formulation: With patient Time For Goal  Achievement: 12/26/21 Potential to Achieve Goals: Good    Frequency Min 2X/week     Co-evaluation PT/OT/SLP Co-Evaluation/Treatment: Yes Reason for Co-Treatment: For patient/therapist safety;To address functional/ADL transfers PT goals addressed during session: Mobility/safety with mobility;Balance;Proper use of DME         AM-PAC PT "6 Clicks" Mobility  Outcome Measure Help needed turning from your back to your side while in a flat bed without using bedrails?: A Lot Help needed moving from lying on your back to sitting on the side of a flat bed without using bedrails?: A Lot Help needed moving to and from a bed to a chair (including a wheelchair)?: A Lot Help needed standing up from a chair using your arms (e.g., wheelchair or bedside chair)?: A Lot Help needed to walk in hospital room?: A Lot Help needed climbing 3-5 steps with a railing? : A Lot 6 Click Score: 12    End of Session   Activity Tolerance: Patient limited by fatigue Patient left: in bed;with call bell/phone within reach Nurse Communication: Mobility status PT Visit Diagnosis: Unsteadiness on feet (R26.81);Other abnormalities of gait and mobility (R26.89);Muscle weakness (generalized) (M62.81);Difficulty in walking, not elsewhere classified (R26.2)    Time: 3235-5732 PT Time Calculation (min) (ACUTE ONLY): 20 min  Charges:   PT Evaluation $PT Eval Moderate Complexity: 1 Mod          Ludy Messamore, PT, GCS 12/12/21,10:26 AM
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
## 2021-12-12 NOTE — Progress Notes (Signed)
Pharmacy Antibiotic Note  MAYANK TEUSCHER is a 76 y.o. male admitted on 12/11/2021 with sepsis secondary to UTI.  Pharmacy has been consulted for Cefepime dosing x 7 days.  Plan: Cefepime 2 gm q24h per indication and current renal fxn.  Pharmacy will continue to follow and will adjust abx dosing as warranted.  Height: 5\' 9"  (175.3 cm) Weight: 95.3 kg (210 lb) IBW/kg (Calculated) : 70.7  Temp (24hrs), Avg:100.1 F (37.8 C), Min:98 F (36.7 C), Max:102.1 F (38.9 C)  Recent Labs  Lab 12/11/21 0045 12/11/21 2113 12/11/21 2114  WBC  --   --  13.8*  CREATININE  --   --  3.64*  LATICACIDVEN 2.0* 2.0*  --     Estimated Creatinine Clearance: 19.7 mL/min (A) (by C-G formula based on SCr of 3.64 mg/dL (H)).    Allergies  Allergen Reactions   Capsaicin Itching and Rash    Severe rash and itching.    Antimicrobials this admission: 1/23 Cefepime >> 1/29  Microbiology results: 1/23 BCx: Pending 1/23 UCx: Pending   Thank you for allowing pharmacy to be a part of this patients care.  Renda Rolls, PharmD, [facility] 12/12/2021 3:38 AM

## 2021-12-13 ENCOUNTER — Encounter: Payer: Medicare Other | Admitting: Urology

## 2021-12-13 ENCOUNTER — Other Ambulatory Visit: Payer: Self-pay | Admitting: Urology

## 2021-12-13 DIAGNOSIS — T796XXA Traumatic ischemia of muscle, initial encounter: Secondary | ICD-10-CM | POA: Diagnosis not present

## 2021-12-13 DIAGNOSIS — N390 Urinary tract infection, site not specified: Secondary | ICD-10-CM | POA: Diagnosis not present

## 2021-12-13 DIAGNOSIS — A419 Sepsis, unspecified organism: Secondary | ICD-10-CM | POA: Diagnosis not present

## 2021-12-13 DIAGNOSIS — R7881 Bacteremia: Secondary | ICD-10-CM | POA: Diagnosis not present

## 2021-12-13 DIAGNOSIS — R6520 Severe sepsis without septic shock: Secondary | ICD-10-CM | POA: Diagnosis not present

## 2021-12-13 DIAGNOSIS — N179 Acute kidney failure, unspecified: Secondary | ICD-10-CM | POA: Diagnosis not present

## 2021-12-13 LAB — CBC
HCT: 43.3 % (ref 39.0–52.0)
Hemoglobin: 14.2 g/dL (ref 13.0–17.0)
MCH: 28.3 pg (ref 26.0–34.0)
MCHC: 32.8 g/dL (ref 30.0–36.0)
MCV: 86.4 fL (ref 80.0–100.0)
Platelets: 84 10*3/uL — ABNORMAL LOW (ref 150–400)
RBC: 5.01 MIL/uL (ref 4.22–5.81)
RDW: 14.5 % (ref 11.5–15.5)
WBC: 9.1 10*3/uL (ref 4.0–10.5)
nRBC: 0 % (ref 0.0–0.2)

## 2021-12-13 LAB — COMPREHENSIVE METABOLIC PANEL
ALT: 200 U/L — ABNORMAL HIGH (ref 0–44)
AST: 390 U/L — ABNORMAL HIGH (ref 15–41)
Albumin: 2.9 g/dL — ABNORMAL LOW (ref 3.5–5.0)
Alkaline Phosphatase: 36 U/L — ABNORMAL LOW (ref 38–126)
Anion gap: 11 (ref 5–15)
BUN: 75 mg/dL — ABNORMAL HIGH (ref 8–23)
CO2: 18 mmol/L — ABNORMAL LOW (ref 22–32)
Calcium: 7.7 mg/dL — ABNORMAL LOW (ref 8.9–10.3)
Chloride: 104 mmol/L (ref 98–111)
Creatinine, Ser: 4.38 mg/dL — ABNORMAL HIGH (ref 0.61–1.24)
GFR, Estimated: 13 mL/min — ABNORMAL LOW (ref >60)
Glucose, Bld: 109 mg/dL — ABNORMAL HIGH (ref 70–99)
Potassium: 4.1 mmol/L (ref 3.5–5.1)
Sodium: 133 mmol/L — ABNORMAL LOW (ref 135–145)
Total Bilirubin: 1.4 mg/dL — ABNORMAL HIGH (ref 0.3–1.2)
Total Protein: 6 g/dL — ABNORMAL LOW (ref 6.5–8.1)

## 2021-12-13 LAB — MAGNESIUM: Magnesium: 2.5 mg/dL — ABNORMAL HIGH (ref 1.7–2.4)

## 2021-12-13 LAB — ECHOCARDIOGRAM COMPLETE
Height: 69 in
S' Lateral: 3.4 cm
Weight: 3360 oz

## 2021-12-13 LAB — PHOSPHORUS: Phosphorus: 4.5 mg/dL (ref 2.5–4.6)

## 2021-12-13 LAB — CK: Total CK: 13461 U/L — ABNORMAL HIGH (ref 49–397)

## 2021-12-13 MED ORDER — ENSURE ENLIVE PO LIQD
237.0000 mL | Freq: Two times a day (BID) | ORAL | Status: DC
  Administered 2021-12-14: 237 mL via ORAL

## 2021-12-13 MED ORDER — DILTIAZEM HCL 30 MG PO TABS
30.0000 mg | ORAL_TABLET | Freq: Four times a day (QID) | ORAL | Status: DC
  Administered 2021-12-13 – 2021-12-14 (×4): 30 mg via ORAL
  Filled 2021-12-13 (×4): qty 1

## 2021-12-13 NOTE — Progress Notes (Signed)
PROGRESS NOTE    Luis Strong  IRC:[id number] DOB: 09-Jan-1946 DOA: 12/11/2021 PCP: Juline Patch, MD    Brief Narrative:  76 year old M with PMH of CKD-3B, CAD, CVA, OSA on CPAP, HTN, GI bleed/esophageal ulcer, and nephrolithiasis s/p bilateral J stent on 12/05/2021 presenting with general malaise and fever, and admitted for severe sepsis due to complicated UTI.  Patient's son found him down on the floor and called EMS. CT renal stone study negative for hydronephrosis or obstruction, and the stents at right place.  Cultures obtained.  Patient was started on IV cefepime.  Urology consulted.   Blood culture with GPC.  Speciation Enterococcus faecalis.  Infectious disease involved.  Patient on IV ampicillin.  Clinically improving.   Assessment & Plan:   Principal Problem:   Severe sepsis ([place]) Active Problems:   HTN (hypertension)   OSA on CPAP   Complicated UTI (urinary tract infection)   Abnormal LFTs   Acute kidney injury superimposed on CKD lllb (HCC)   History of GI bleed   History of peptic ulcer   S/P cystoscopy with ureteral stent placement   High anion gap metabolic acidosis  - Severe sepsis due to bacteremia and complicated UTI and patient with nephrolithiasis s/p bilateral JJ stent on 12/05/2020 -Enterococcus faecalis bacteremia -Elevated liver function test secondary to sepsis  POA.  Had fever, tachycardia, tachypnea, AKI and lactic acidosis.  Culture data as above.  Lactic acidosis resolved.  Pro-Cal elevated to 7.92.  Leukocytosis improving. Plan: Continue fluids, NS at 125 cc/h On IV ampicillin per ID recommendations Monitor vitals and fever curve  Atrial fibrillation with rapid ventricular response New onset Required Cardizem gtt., now weaned off Plan: P.o. Cardizem 30 mg every 6 hours Continue home metoprolol Defer anticoagulation for now as likely A. fib was driven by underlying severe sepsis   AKI on CKD-3B  Likely from rhabdomyolysis and sepsis.    CT renal stone study without hydronephrosis or obstruction Creatinine worsening over interval Plan: Continue aggressive IV fluids Monitor UOP If creatinine worsens we will involve nephrology for recommendations on 1/26   Anion gap metabolic acidosis  Likely due to renal failure.  Improved. -Continue monitoring   Possible fall at home Rhabdomyolysis CK elevated to 31,000.  Could be traumatic.   He was found down by his patient's son He is also active, working out 6 times weekly Plan: Aggressive IVF Hold home statin Therapy evaluations    Elevated liver enzymes/hyperbilirubinemia  Likely due to rhabdomyolysis.  RUQ [place] without acute finding.   Patient is s/p cholecystectomy.  No GI symptoms.  Improving. -IV fluids as above -Daily CMP   Essential hypertension: Normotensive for most part. -Continue home metoprolol   OSA on CPAP -Continue nightly CPAP   History of GI bleed/esophageal ulcer -Continue home Protonix   Thrombocytopenia  Acute on chronic.  Likely due to sepsis. -Discontinue subcu Lovenox -SCD for VT prophylaxis   DVT prophylaxis: SCD Code Status: Full Family Communication: None today Disposition Plan: Status is: Inpatient  Remains inpatient appropriate because: Severe sepsis, AKI, enterococcal bacteremia.  Disposition plan pending.       Level of care: Progressive  Consultants:  ID  Procedures:  None  Antimicrobials: Ampicillin   Subjective: Seen and examined.  Reports symptomatic improvement over interval.  No pain complaints.  Does Dors weakness  Objective: Vitals:   12/13/21 0441 12/13/21 0746 12/13/21 1141 12/13/21 1232  BP:  (!) 141/78 126/75   Pulse:  86 78   Resp:  20 (!) 24   Temp:  97.6 F (36.4 C)    TempSrc:      SpO2:  98% 95% 94%  Weight: 98.2 kg     Height:        Intake/Output Summary (Last 24 hours) at 12/13/2021 1332 Last data filed at 12/13/2021 1018 Gross per 24 hour  Intake 240 ml  Output 1450 ml  Net  -1210 ml   Filed Weights   12/11/21 2106 12/13/21 0441  Weight: 95.3 kg 98.2 kg    Examination:  General exam no acute distress Respiratory system: Lungs clear.  Normal work of breathing.  Room air Cardiovascular system: S1-S2, RRR, no murmurs, no pedal edema Gastrointestinal system: Soft, NT/ND, normal bowel sounds Central nervous system: Alert and oriented. No focal neurological deficits. Extremities: Diffusely weak bilaterally Skin: No rashes, lesions or ulcers Psychiatry: Judgement and insight appear normal. Mood & affect appropriate.     Data Reviewed: I have personally reviewed following labs and imaging studies  CBC: Recent Labs  Lab 12/11/21 2114 12/12/21 0719 12/13/21 0629  WBC 13.8* 11.6* 9.1  NEUTROABS 12.5*  --   --   HGB 16.7 14.5 14.2  HCT 49.8 43.7 43.3  MCV 84.4 85.9 86.4  PLT 91* 88* 84*   Basic Metabolic Panel: Recent Labs  Lab 12/11/21 2114 12/12/21 0719 12/13/21 0629  NA 132* 135 133*  K 3.9 4.7 4.1  CL 99 102 104  CO2 17* 19* 18*  GLUCOSE 153* 130* 109*  BUN 55* 65* 75*  CREATININE 3.64* 4.16* 4.38*  CALCIUM 8.9 8.4* 7.7*  MG  --  2.4 2.5*  PHOS  --   --  4.5   GFR: Estimated Creatinine Clearance: 16.6 mL/min (A) (by C-G formula based on SCr of 4.38 mg/dL (H)). Liver Function Tests: Recent Labs  Lab 12/11/21 2114 12/12/21 0719 12/13/21 0629  AST 815* 618* 390*  ALT 230* 207* 200*  ALKPHOS 55 41 36*  BILITOT 2.0* 1.3* 1.4*  PROT 7.3 6.4* 6.0*  ALBUMIN 3.8 3.2* 2.9*   No results for input(s): LIPASE, AMYLASE in the last 168 hours. Recent Labs  Lab 12/12/21 1655  AMMONIA 28   Coagulation Profile: Recent Labs  Lab 12/11/21 2114 12/12/21 0719  INR 1.2 1.2   Cardiac Enzymes: Recent Labs  Lab 12/12/21 0719 12/13/21 0629  CKTOTAL 31,396* 13,461*   BNP (last 3 results) No results for input(s): PROBNP in the last 8760 hours. HbA1C: No results for input(s): HGBA1C in the last 72 hours. CBG: No results for input(s):  GLUCAP in the last 168 hours. Lipid Profile: No results for input(s): CHOL, HDL, LDLCALC, TRIG, CHOLHDL, LDLDIRECT in the last 72 hours. Thyroid Function Tests: No results for input(s): TSH, T4TOTAL, FREET4, T3FREE, THYROIDAB in the last 72 hours. Anemia Panel: No results for input(s): VITAMINB12, FOLATE, FERRITIN, TIBC, IRON, RETICCTPCT in the last 72 hours. Sepsis Labs: Recent Labs  Lab 12/11/21 0045 12/11/21 2113 12/12/21 0719  PROCALCITON  --   --  7.92  LATICACIDVEN 2.0* 2.0* 1.8    Recent Results (from the past 240 hour(s))  Culture, blood (Routine x 2)     Status: Abnormal (Preliminary result)   Collection Time: 12/11/21  9:13 PM   Specimen: BLOOD  Result Value Ref Range Status   Specimen Description   Final    BLOOD LEFT FOREARM Performed at [facility], [address] [place], [place] [address]    Special Requests   Final    BOTTLES DRAWN  AEROBIC AND ANAEROBIC Blood Culture adequate volume Performed at [facility], [address] [place], [place] [address]    Culture  Setup Time   Final    GRAM POSITIVE COCCI IN BOTH AEROBIC AND ANAEROBIC BOTTLES CRITICAL RESULT CALLED TO, READ BACK BY AND VERIFIED WITH: MORGAN HICKS 12/12/21 1205 MW    Culture ENTEROCOCCUS FAECALIS (A)  Final   Report Status PENDING  Incomplete  Culture, blood (Routine x 2)     Status: Abnormal (Preliminary result)   Collection Time: 12/11/21  9:13 PM   Specimen: BLOOD  Result Value Ref Range Status   Specimen Description   Final    BLOOD RW Performed at [facility], [address] [place], [place] [address]    Special Requests   Final    BOTTLES DRAWN AEROBIC AND ANAEROBIC BCAV Performed at [facility], [address], [place], [place] [address]    Culture  Setup Time   Final    GRAM POSITIVE COCCI IN BOTH AEROBIC AND ANAEROBIC BOTTLES CRITICAL RESULT CALLED TO, READ BACK BY AND VERIFIED WITH: MORGAN HICKS 12/12/21 1205 MW    Culture (A)   Final    ENTEROCOCCUS FAECALIS SUSCEPTIBILITIES TO FOLLOW Performed at [facility], [place] [address], [place], [place] [address]    Report Status PENDING  Incomplete  Blood Culture ID Panel (Reflexed)     Status: Abnormal   Collection Time: 12/11/21  9:13 PM  Result Value Ref Range Status   Enterococcus faecalis DETECTED (A) NOT DETECTED Final    Comment: CRITICAL RESULT CALLED TO, READ BACK BY AND VERIFIED WITH: MORGAN HICKS 12/12/21 1206 MW    Enterococcus Faecium NOT DETECTED NOT DETECTED Final   Listeria monocytogenes NOT DETECTED NOT DETECTED Final   Staphylococcus species NOT DETECTED NOT DETECTED Final   Staphylococcus aureus (BCID) NOT DETECTED NOT DETECTED Final   Staphylococcus epidermidis NOT DETECTED NOT DETECTED Final   Staphylococcus lugdunensis NOT DETECTED NOT DETECTED Final   Streptococcus species NOT DETECTED NOT DETECTED Final   Streptococcus agalactiae NOT DETECTED NOT DETECTED Final   Streptococcus pneumoniae NOT DETECTED NOT DETECTED Final   Streptococcus pyogenes NOT DETECTED NOT DETECTED Final   A.calcoaceticus-baumannii NOT DETECTED NOT DETECTED Final   Bacteroides fragilis NOT DETECTED NOT DETECTED Final   Enterobacterales NOT DETECTED NOT DETECTED Final   Enterobacter cloacae complex NOT DETECTED NOT DETECTED Final   Escherichia coli NOT DETECTED NOT DETECTED Final   Klebsiella aerogenes NOT DETECTED NOT DETECTED Final   Klebsiella oxytoca NOT DETECTED NOT DETECTED Final   Klebsiella pneumoniae NOT DETECTED NOT DETECTED Final   Proteus species NOT DETECTED NOT DETECTED Final   Salmonella species NOT DETECTED NOT DETECTED Final   Serratia marcescens NOT DETECTED NOT DETECTED Final   Haemophilus influenzae NOT DETECTED NOT DETECTED Final   Neisseria meningitidis NOT DETECTED NOT DETECTED Final   Pseudomonas aeruginosa NOT DETECTED NOT DETECTED Final   Stenotrophomonas maltophilia NOT DETECTED NOT DETECTED Final   Candida albicans NOT DETECTED NOT  DETECTED Final   Candida auris NOT DETECTED NOT DETECTED Final   Candida glabrata NOT DETECTED NOT DETECTED Final   Candida krusei NOT DETECTED NOT DETECTED Final   Candida parapsilosis NOT DETECTED NOT DETECTED Final   Candida tropicalis NOT DETECTED NOT DETECTED Final   Cryptococcus neoformans/gattii NOT DETECTED NOT DETECTED Final   Vancomycin resistance NOT DETECTED NOT DETECTED Final    Comment: Performed at [facility], [address], [place], [place] [address]  Resp Panel by RT-PCR (Flu A&B, Covid) Nasopharyngeal Swab     Status: None   Collection Time: 12/11/21  9:14 PM   Specimen: Nasopharyngeal Swab; Nasopharyngeal(NP) swabs in vial transport medium  Result Value Ref Range Status   SARS Coronavirus 2 by RT PCR NEGATIVE NEGATIVE Final    Comment: (NOTE) SARS-CoV-2 target nucleic acids are NOT DETECTED.  The SARS-CoV-2 RNA is generally detectable in upper respiratory specimens during the acute phase of infection. The lowest concentration of SARS-CoV-2 viral copies this assay can detect is 138 copies/mL. A negative result does not preclude SARS-Cov-2 infection and should not be used as the sole basis for treatment or other patient management decisions. A negative result may occur with  improper specimen collection/handling, submission of specimen other than nasopharyngeal swab, presence of viral mutation(s) within the areas targeted by this assay, and inadequate number of viral copies(<138 copies/mL). A negative result must be combined with clinical observations, patient history, and epidemiological information. The expected result is Negative.  Fact Sheet for Patients:  EntrepreneurPulse.com.au  Fact Sheet for Healthcare Providers:  IncredibleEmployment.be  This test is no t yet approved or cleared by the [place] FDA and  has been authorized for detection and/or diagnosis of SARS-CoV-2 by FDA under an Emergency Use  Authorization (EUA). This EUA will remain  in effect (meaning this test can be used) for the duration of the COVID-19 declaration under Section 564(b)(1) of the Act, 21 U.S.C.section 360bbb-3(b)(1), unless the authorization is terminated  or revoked sooner.       Influenza A by PCR NEGATIVE NEGATIVE Final   Influenza B by PCR NEGATIVE NEGATIVE Final    Comment: (NOTE) The Xpert Xpress SARS-CoV-2/FLU/RSV plus assay is intended as an aid in the diagnosis of influenza from Nasopharyngeal swab specimens and should not be used as a sole basis for treatment. Nasal washings and aspirates are unacceptable for Xpert Xpress SARS-CoV-2/FLU/RSV testing.  Fact Sheet for Patients: EntrepreneurPulse.com.au  Fact Sheet for Healthcare Providers: IncredibleEmployment.be  This test is not yet approved or cleared by the [place] FDA and has been authorized for detection and/or diagnosis of SARS-CoV-2 by FDA under an Emergency Use Authorization (EUA). This EUA will remain in effect (meaning this test can be used) for the duration of the COVID-19 declaration under Section 564(b)(1) of the Act, 21 U.S.C. section 360bbb-3(b)(1), unless the authorization is terminated or revoked.  Performed at [facility], [address], [place], [place] [address]   Urine Culture     Status: Abnormal (Preliminary result)   Collection Time: 12/11/21 10:27 PM   Specimen: Urine, Random  Result Value Ref Range Status   Specimen Description   Final    URINE, RANDOM Performed at [facility], [address] [place], [place] [address]    Special Requests   Final    NONE Performed at [facility], [address] [place],  [address]    Culture >=100,000 COLONIES/mL ENTEROCOCCUS FAECALIS (A)  Final   Report Status PENDING  Incomplete         Radiology Studies: DG Chest Port 1 View  Result Date: 12/12/2021 CLINICAL DATA:  Tachypnea.   Weakness. EXAM: PORTABLE CHEST 1 VIEW COMPARISON:  12/11/2021 FINDINGS: Midline trachea. Normal heart size for level of inspiration. No pleural effusion or pneumothorax. Moderate pulmonary interstitial thickening is new or increased. No lobar consolidation. Mild left base scarring or subsegmental atelectasis. IMPRESSION: Moderate pulmonary interstitial thickening, new or increased. In this never smoker, considerations include  mild pulmonary venous congestion or viral/atypical/mycoplasma pneumonia. Electronically Signed   By: Abigail Miyamoto M.D.   On: 12/12/2021 16:50   DG Chest Port 1 View  Result Date: 12/11/2021 CLINICAL DATA:  Generalized weakness EXAM: PORTABLE CHEST 1 VIEW COMPARISON:  06/23/2018 FINDINGS: Mild bronchitic changes. Streaky atelectasis or scar left base. No consolidation, pleural effusion, or pneumothorax. Cardiomediastinal silhouette within normal limits. Aortic atherosclerosis. IMPRESSION: Mild bronchitic changes with atelectasis or scar at the left base. Electronically Signed   By: Donavan Foil M.D.   On: 12/11/2021 21:40   ECHOCARDIOGRAM COMPLETE  Result Date: 12/13/2021    ECHOCARDIOGRAM REPORT   Patient Name:   SAXON BARICH Date of Exam: 12/12/2021 Medical Rec #:  [id number]         Height:       69.0 in Accession #:    [id number]        Weight:       210.0 lb Date of Birth:  01/14/1946        BSA:          2.109 m Patient Age:    11 years          BP:           120/68 mmHg Patient Gender: M                 HR:           108 bpm. Exam Location:  [facility] Procedure: 2D Echo, Cardiac Doppler and Color Doppler Indications:     R78.81 Bacteremia  History:         Patient has no prior history of Echocardiogram examinations.                  Stroke; Risk Factors:Hypertension, Dyslipidemia and Sleep                  Apnea.  Sonographer:     Cresenciano Lick RDCS Referring Phys:  [id number] Charlesetta Ivory GONFA Diagnosing Phys: Nelva Bush MD IMPRESSIONS  1. Left ventricular ejection  fraction, by estimation, is 55 to 60%. The left ventricle has normal function. The left ventricle has no regional wall motion abnormalities. Left ventricular diastolic parameters are indeterminate.  2. Right ventricular systolic function is normal. The right ventricular size is normal.  3. The mitral valve is degenerative. Trivial mitral valve regurgitation. No evidence of mitral stenosis.  4. The aortic valve is tricuspid. There is mild thickening of the aortic valve. Aortic valve regurgitation is mild. Aortic valve sclerosis is present, with no evidence of aortic valve stenosis.  5. Aortic dilatation noted. There is borderline dilatation of the aortic root, measuring 38 mm. There is mild dilatation of the ascending aorta, measuring 38 mm.  6. The inferior vena cava is normal in size with <50% respiratory variability, suggesting right atrial pressure of 8 mmHg. Conclusion(s)/Recommendation(s): There is no definite evidence of a vegetation. However, native valvular disease is present. Consider transesophageal echocardiogram if clinical concern for endocarditis persists. FINDINGS  Left Ventricle: Left ventricular ejection fraction, by estimation, is 55 to 60%. The left ventricle has normal function. The left ventricle has no regional wall motion abnormalities. The left ventricular internal cavity size was normal in size. There is  no left ventricular hypertrophy. Left ventricular diastolic parameters are indeterminate. Right Ventricle: The right ventricular size is normal. No increase in right ventricular wall thickness. Right ventricular systolic function is normal. Left Atrium: Left atrial size was normal in size. Right  Atrium: Right atrial size was normal in size. Pericardium: There is no evidence of pericardial effusion. Mitral Valve: The mitral valve is degenerative in appearance. There is mild thickening of the mitral valve leaflet(s). There is mild calcification of the mitral valve leaflet(s). Trivial mitral  valve regurgitation. No evidence of mitral valve stenosis. Tricuspid Valve: The tricuspid valve is normal in structure. Tricuspid valve regurgitation is trivial. Aortic Valve: The aortic valve is tricuspid. There is mild thickening of the aortic valve. Aortic valve regurgitation is mild. Aortic valve sclerosis is present, with no evidence of aortic valve stenosis. Pulmonic Valve: The pulmonic valve was not well visualized. Pulmonic valve regurgitation is not visualized. No evidence of pulmonic stenosis. Aorta: Aortic dilatation noted. There is borderline dilatation of the aortic root, measuring 38 mm. There is mild dilatation of the ascending aorta, measuring 38 mm. Pulmonary Artery: The pulmonary artery is not well seen. Venous: The inferior vena cava is normal in size with less than 50% respiratory variability, suggesting right atrial pressure of 8 mmHg. IAS/Shunts: No atrial level shunt detected by color flow Doppler.  LEFT VENTRICLE PLAX 2D LVIDd:         4.70 cm LVIDs:         3.40 cm LV PW:         1.00 cm LV IVS:        0.90 cm LVOT diam:     2.10 cm LV SV:         51 LV SV Index:   24 LVOT Area:     3.46 cm  RIGHT VENTRICLE             IVC RV Basal diam:  4.10 cm     IVC diam: 1.50 cm RV S prime:     13.20 cm/s TAPSE (M-mode): 2.0 cm LEFT ATRIUM             Index        RIGHT ATRIUM           Index LA diam:        5.10 cm 2.42 cm/m   RA Area:     13.20 cm LA Vol (A2C):   54.9 ml 26.03 ml/m  RA Volume:   35.90 ml  17.02 ml/m LA Vol (A4C):   48.8 ml 23.14 ml/m LA Biplane Vol: 54.2 ml 25.70 ml/m  AORTIC VALVE LVOT Vmax:   89.40 cm/s LVOT Vmean:  66.500 cm/s LVOT VTI:    0.148 m  AORTA Ao Root diam: 3.80 cm Ao Asc diam:  3.70 cm MV E velocity: 111.50 cm/s                             SHUNTS                             Systemic VTI:  0.15 m                             Systemic Diam: 2.10 cm Nelva Bush MD Electronically signed by Nelva Bush MD Signature Date/Time: 12/13/2021/10:37:34 AM    Final     CT Renal Stone Study  Result Date: 12/11/2021 CLINICAL DATA:  Nephrolithiasis, flank pain, sepsis EXAM: CT ABDOMEN AND PELVIS WITHOUT CONTRAST TECHNIQUE: Multidetector CT imaging of the abdomen and pelvis was performed following the standard protocol without IV contrast.  RADIATION DOSE REDUCTION: This exam was performed according to the departmental dose-optimization program which includes automated exposure control, adjustment of the mA and/or kV according to patient size and/or use of iterative reconstruction technique. COMPARISON:  11/21/2021 FINDINGS: Lower chest: The visualized lung bases are clear. Cardiac size within normal limits. No pericardial effusion. Small hiatal hernia. Hepatobiliary: No focal liver abnormality is seen. Status post cholecystectomy. No biliary dilatation. Pancreas: Unremarkable Spleen: Unremarkable Adrenals/Urinary Tract: The adrenal glands are unremarkable. The kidneys are normal in size and position. Since the prior examination, nonobstructing calculus within the a right ureter has been removed and a double-J ureteral stent is seen in expected position extending from the right upper pole into the urinary bladder. Multiple nonobstructing renal calculi are again seen bilaterally which appear grossly stable in size and position since prior examination with the dominant 7 mm calculus seen within the lower pole the right kidney. Multiple simple cortical cysts are seen bilaterally. No hydronephrosis. No ureteral calculi on the left. The bladder is partially decompressed. Tiny dependently layering 2 mm calcification may represent a recently passed calculus or residua of recent intervention. Stomach/Bowel: Moderate sigmoid diverticulosis. The stomach, small bowel, and large bowel are otherwise unremarkable. No evidence of obstruction focal inflammation. The appendix is normal. Vascular/Lymphatic: Aortic atherosclerosis. No enlarged abdominal or pelvic lymph nodes. Reproductive: The  prostate gland is mildly enlarged and indents the base of the bladder. Seminal vesicles are unremarkable. Other: No abdominal wall hernia or abnormality. No abdominopelvic ascites. Musculoskeletal: No acute bone abnormality. No lytic or blastic bone lesion. Degenerative changes are seen within the lumbar spine. IMPRESSION: Interval removal of previously noted right ureteral calculus in placement of a double-J ureteral stent in expected position. No hydronephrosis. Stable superimposed bilateral nonobstructing nephrolithiasis. New tiny calculus within the bladder lumen likely the residua of recent intervention or a recently passed calculus. Moderate sigmoid diverticulosis without superimposed acute inflammatory change. Mild prostatic enlargement. Aortic Atherosclerosis (ICD10-I70.0). Electronically Signed   By: Fidela Salisbury M.D.   On: 12/11/2021 21:55   US Abdomen Limited RUQ (LIVER/GB)  Result Date: 12/11/2021 CLINICAL DATA:  Elevated liver enzymes. EXAM: ULTRASOUND ABDOMEN LIMITED RIGHT UPPER QUADRANT COMPARISON:  CT abdomen pelvis dated 12/11/2021. FINDINGS: Gallbladder: Cholecystectomy. Common bile duct: Diameter: 6 mm Liver: The liver demonstrates a normal echogenicity. There is a small right liver calcified granuloma. Portal vein is patent on color Doppler imaging with normal direction of blood flow towards the liver. Other: Several cysts noted in the right kidney. IMPRESSION: 1. Cholecystectomy. 2. Right renal cysts. Electronically Signed   By: Anner Crete M.D.   On: 12/11/2021 23:27        Scheduled Meds:  aspirin EC  81 mg Oral Daily   citalopram  40 mg Oral Daily   darifenacin  7.5 mg Oral Daily   diltiazem  30 mg Oral Q6H   metoprolol tartrate  25 mg Oral BID   multivitamin with minerals  1 tablet Oral Daily   pantoprazole  40 mg Oral Daily   Continuous Infusions:  sodium chloride 125 mL/hr at 12/13/21 0242   ampicillin (OMNIPEN) IV 2 g (12/13/21 1307)     LOS: 1 day     Time spent: 50 minutes    Sidney Ace, MD [facility]   If 7PM-7AM, please contact night-coverage  12/13/2021, 1:32 PM

## 2021-12-13 NOTE — Progress Notes (Signed)
Date of Admission:  12/11/2021     ID: Luis Strong is a 76 y.o. male  Principal Problem:   Severe sepsis ([place]) Active Problems:   HTN (hypertension)   OSA on CPAP   Complicated UTI (urinary tract infection)   Abnormal LFTs   Acute kidney injury superimposed on CKD lllb (HCC)   History of GI bleed   History of peptic ulcer   S/P cystoscopy with ureteral stent placement   High anion gap metabolic acidosis  Son at bed side  Subjective: Pt is feeling better Very thirsty No fever Appetite better  Medications:   aspirin EC  81 mg Oral Daily   citalopram  40 mg Oral Daily   darifenacin  7.5 mg Oral Daily   diltiazem  30 mg Oral Q6H   metoprolol tartrate  25 mg Oral BID   multivitamin with minerals  1 tablet Oral Daily   pantoprazole  40 mg Oral Daily    Objective: Vital signs in last 24 hours: Temp:  [97.6 F (36.4 C)-99.2 F (37.3 C)] 97.6 F (36.4 C) (01/25 0746) Pulse Rate:  [78-141] 78 (01/25 1141) Resp:  [17-44] 24 (01/25 1141) BP: (98-141)/(56-107) 126/75 (01/25 1141) SpO2:  [91 %-98 %] 94 % (01/25 1232) Weight:  [98.2 kg] 98.2 kg (01/25 0441)  PHYSICAL EXAM:  General: Alert, cooperative, no distress, appears stated age.  Head: Normocephalic, without obvious abnormality, atraumatic. Eyes: Conjunctivae clear, anicteric sclerae. Pupils are equal ENT Nares normal. No drainage or sinus tenderness. Oral mucosa very dry Neck:  symmetrical, no adenopathy, thyroid: non tender no carotid bruit and no JVD. Back: No CVA tenderness. Lungs: Clear to auscultation bilaterally. No Wheezing or Rhonchi. No rales. Heart: Regular rate and rhythm, no murmur, rub or gallop. Abdomen: Soft, non-tender,not distended. Bowel sounds normal. No masses Extremities: bruising left knee Skin: No rashes or lesions. Or bruising Lymph: Cervical, supraclavicular normal. Neurologic: Grossly non-focal  Lab Results Recent Labs    12/12/21 0719 12/13/21 0629  WBC 11.6* 9.1  HGB  14.5 14.2  HCT 43.7 43.3  NA 135 133*  K 4.7 4.1  CL 102 104  CO2 19* 18*  BUN 65* 75*  CREATININE 4.16* 4.38*   Liver Panel Recent Labs    12/12/21 0719 12/13/21 0629  PROT 6.4* 6.0*  ALBUMIN 3.2* 2.9*  AST 618* 390*  ALT 207* 200*  ALKPHOS 41 36*  BILITOT 1.3* 1.4*   Sedimentation Rate No results for input(s): ESRSEDRATE in the last 72 hours. C-Reactive Protein No results for input(s): CRP in the last 72 hours.  Microbiology: 1/23 BC/UC- enterococcus fecalis Studies/Results: DG Chest Port 1 View  Result Date: 12/12/2021 CLINICAL DATA:  Tachypnea.  Weakness. EXAM: PORTABLE CHEST 1 VIEW COMPARISON:  12/11/2021 FINDINGS: Midline trachea. Normal heart size for level of inspiration. No pleural effusion or pneumothorax. Moderate pulmonary interstitial thickening is new or increased. No lobar consolidation. Mild left base scarring or subsegmental atelectasis. IMPRESSION: Moderate pulmonary interstitial thickening, new or increased. In this never smoker, considerations include mild pulmonary venous congestion or viral/atypical/mycoplasma pneumonia. Electronically Signed   By: Abigail Miyamoto M.D.   On: 12/12/2021 16:50   DG Chest Port 1 View  Result Date: 12/11/2021 CLINICAL DATA:  Generalized weakness EXAM: PORTABLE CHEST 1 VIEW COMPARISON:  06/23/2018 FINDINGS: Mild bronchitic changes. Streaky atelectasis or scar left base. No consolidation, pleural effusion, or pneumothorax. Cardiomediastinal silhouette within normal limits. Aortic atherosclerosis. IMPRESSION: Mild bronchitic changes with atelectasis or scar at the left base. Electronically  Signed   By: Donavan Foil M.D.   On: 12/11/2021 21:40   ECHOCARDIOGRAM COMPLETE  Result Date: 12/13/2021    ECHOCARDIOGRAM REPORT   Patient Name:   Luis Strong Date of Exam: 12/12/2021 Medical Rec #:  [id number]         Height:       69.0 in Accession #:    [id number]        Weight:       210.0 lb Date of Birth:  [birth year]        BSA:           2.109 m Patient Age:    60 years          BP:           120/68 mmHg Patient Gender: M                 HR:           108 bpm. Exam Location:  [facility] Procedure: 2D Echo, Cardiac Doppler and Color Doppler Indications:     R78.81 Bacteremia  History:         Patient has no prior history of Echocardiogram examinations.                  Stroke; Risk Factors:Hypertension, Dyslipidemia and Sleep                  Apnea.  Sonographer:     Cresenciano Lick RDCS Referring Phys:  [id number] Charlesetta Ivory GONFA Diagnosing Phys: Nelva Bush MD IMPRESSIONS  1. Left ventricular ejection fraction, by estimation, is 55 to 60%. The left ventricle has normal function. The left ventricle has no regional wall motion abnormalities. Left ventricular diastolic parameters are indeterminate.  2. Right ventricular systolic function is normal. The right ventricular size is normal.  3. The mitral valve is degenerative. Trivial mitral valve regurgitation. No evidence of mitral stenosis.  4. The aortic valve is tricuspid. There is mild thickening of the aortic valve. Aortic valve regurgitation is mild. Aortic valve sclerosis is present, with no evidence of aortic valve stenosis.  5. Aortic dilatation noted. There is borderline dilatation of the aortic root, measuring 38 mm. There is mild dilatation of the ascending aorta, measuring 38 mm.  6. The inferior vena cava is normal in size with <50% respiratory variability, suggesting right atrial pressure of 8 mmHg. Conclusion(s)/Recommendation(s): There is no definite evidence of a vegetation. However, native valvular disease is present. Consider transesophageal echocardiogram if clinical concern for endocarditis persists. FINDINGS  Left Ventricle: Left ventricular ejection fraction, by estimation, is 55 to 60%. The left ventricle has normal function. The left ventricle has no regional wall motion abnormalities. The left ventricular internal cavity size was normal in size. There is  no left ventricular  hypertrophy. Left ventricular diastolic parameters are indeterminate. Right Ventricle: The right ventricular size is normal. No increase in right ventricular wall thickness. Right ventricular systolic function is normal. Left Atrium: Left atrial size was normal in size. Right Atrium: Right atrial size was normal in size. Pericardium: There is no evidence of pericardial effusion. Mitral Valve: The mitral valve is degenerative in appearance. There is mild thickening of the mitral valve leaflet(s). There is mild calcification of the mitral valve leaflet(s). Trivial mitral valve regurgitation. No evidence of mitral valve stenosis. Tricuspid Valve: The tricuspid valve is normal in structure. Tricuspid valve regurgitation is trivial. Aortic Valve: The aortic valve is tricuspid. There is mild thickening of the  aortic valve. Aortic valve regurgitation is mild. Aortic valve sclerosis is present, with no evidence of aortic valve stenosis. Pulmonic Valve: The pulmonic valve was not well visualized. Pulmonic valve regurgitation is not visualized. No evidence of pulmonic stenosis. Aorta: Aortic dilatation noted. There is borderline dilatation of the aortic root, measuring 38 mm. There is mild dilatation of the ascending aorta, measuring 38 mm. Pulmonary Artery: The pulmonary artery is not well seen. Venous: The inferior vena cava is normal in size with less than 50% respiratory variability, suggesting right atrial pressure of 8 mmHg. IAS/Shunts: No atrial level shunt detected by color flow Doppler.  LEFT VENTRICLE PLAX 2D LVIDd:         4.70 cm LVIDs:         3.40 cm LV PW:         1.00 cm LV IVS:        0.90 cm LVOT diam:     2.10 cm LV SV:         51 LV SV Index:   24 LVOT Area:     3.46 cm  RIGHT VENTRICLE             IVC RV Basal diam:  4.10 cm     IVC diam: 1.50 cm RV S prime:     13.20 cm/s TAPSE (M-mode): 2.0 cm LEFT ATRIUM             Index        RIGHT ATRIUM           Index LA diam:        5.10 cm 2.42 cm/m   RA  Area:     13.20 cm LA Vol (A2C):   54.9 ml 26.03 ml/m  RA Volume:   35.90 ml  17.02 ml/m LA Vol (A4C):   48.8 ml 23.14 ml/m LA Biplane Vol: 54.2 ml 25.70 ml/m  AORTIC VALVE LVOT Vmax:   89.40 cm/s LVOT Vmean:  66.500 cm/s LVOT VTI:    0.148 m  AORTA Ao Root diam: 3.80 cm Ao Asc diam:  3.70 cm MV E velocity: 111.50 cm/s                             SHUNTS                             Systemic VTI:  0.15 m                             Systemic Diam: 2.10 cm Nelva Bush MD Electronically signed by Nelva Bush MD Signature Date/Time: 12/13/2021/10:37:34 AM    Final    CT Renal Stone Study  Result Date: 12/11/2021 CLINICAL DATA:  Nephrolithiasis, flank pain, sepsis EXAM: CT ABDOMEN AND PELVIS WITHOUT CONTRAST TECHNIQUE: Multidetector CT imaging of the abdomen and pelvis was performed following the standard protocol without IV contrast. RADIATION DOSE REDUCTION: This exam was performed according to the departmental dose-optimization program which includes automated exposure control, adjustment of the mA and/or kV according to patient size and/or use of iterative reconstruction technique. COMPARISON:  11/21/2021 FINDINGS: Lower chest: The visualized lung bases are clear. Cardiac size within normal limits. No pericardial effusion. Small hiatal hernia. Hepatobiliary: No focal liver abnormality is seen. Status post cholecystectomy. No biliary dilatation. Pancreas: Unremarkable Spleen: Unremarkable Adrenals/Urinary Tract: The adrenal glands are unremarkable.  The kidneys are normal in size and position. Since the prior examination, nonobstructing calculus within the a right ureter has been removed and a double-J ureteral stent is seen in expected position extending from the right upper pole into the urinary bladder. Multiple nonobstructing renal calculi are again seen bilaterally which appear grossly stable in size and position since prior examination with the dominant 7 mm calculus seen within the lower pole the  right kidney. Multiple simple cortical cysts are seen bilaterally. No hydronephrosis. No ureteral calculi on the left. The bladder is partially decompressed. Tiny dependently layering 2 mm calcification may represent a recently passed calculus or residua of recent intervention. Stomach/Bowel: Moderate sigmoid diverticulosis. The stomach, small bowel, and large bowel are otherwise unremarkable. No evidence of obstruction focal inflammation. The appendix is normal. Vascular/Lymphatic: Aortic atherosclerosis. No enlarged abdominal or pelvic lymph nodes. Reproductive: The prostate gland is mildly enlarged and indents the base of the bladder. Seminal vesicles are unremarkable. Other: No abdominal wall hernia or abnormality. No abdominopelvic ascites. Musculoskeletal: No acute bone abnormality. No lytic or blastic bone lesion. Degenerative changes are seen within the lumbar spine. IMPRESSION: Interval removal of previously noted right ureteral calculus in placement of a double-J ureteral stent in expected position. No hydronephrosis. Stable superimposed bilateral nonobstructing nephrolithiasis. New tiny calculus within the bladder lumen likely the residua of recent intervention or a recently passed calculus. Moderate sigmoid diverticulosis without superimposed acute inflammatory change. Mild prostatic enlargement. Aortic Atherosclerosis (ICD10-I70.0). Electronically Signed   By: Fidela Salisbury M.D.   On: 12/11/2021 21:55   US Abdomen Limited RUQ (LIVER/GB)  Result Date: 12/11/2021 CLINICAL DATA:  Elevated liver enzymes. EXAM: ULTRASOUND ABDOMEN LIMITED RIGHT UPPER QUADRANT COMPARISON:  CT abdomen pelvis dated 12/11/2021. FINDINGS: Gallbladder: Cholecystectomy. Common bile duct: Diameter: 6 mm Liver: The liver demonstrates a normal echogenicity. There is a small right liver calcified granuloma. Portal vein is patent on color Doppler imaging with normal direction of blood flow towards the liver. Other: Several cysts  noted in the right kidney. IMPRESSION: 1. Cholecystectomy. 2. Right renal cysts. Electronically Signed   By: Anner Crete M.D.   On: 12/11/2021 23:27     Assessment/Plan: Enterococcus bacteremia with sepsis secondary complictaed UTI, following laser lithitripsy, stent placement rt side Pt is on IV ampicillin- will need for a total of 14 days  Patient does not have any heart device or valve replacement and hence endocarditis is less likely No joint replacements  AKI on CKD- multifactorial sepsis/rhabdomyolysis /pre renal worsening    Severe rhabdomyolysis- due to fall and being on the floor --contributing to AKI- management as per primary team Can explain trasaminitis ( AST ) increase as well  Transaminitis due to sepsis/rhabdo Improving Discussed the management with the patient and his son Also discussed with hospitalist

## 2021-12-13 NOTE — TOC Progression Note (Signed)
Transition of Care [facility]) - Progression Note    Patient Details  Name: Luis Strong MRN: [id number] Date of Birth: March 18, 1946  [facility] CM/SW [place], [place] Phone Number: 12/13/2021, 3:10 PM  Clinical Narrative:     CSW spoke with patient regarding SNF rec, when asked if he was okay with this as a discharge plan he responded "not really". Reports at baseline he was working out and exercising a lot, states he feels he would be safe to discharge home and receive home health PT, OT and RN. Reports that is his plan currently.   TOC has already set patient up with [facility] pending medical readiness to dc home.    Expected Discharge Plan: [place] Barriers to Discharge: Continued Medical Work up  Expected Discharge Plan and Services Expected Discharge Plan: [place]   Discharge Planning Services: CM Consult Post Acute Care Choice: [place] arrangements for the past 2 months: Single Family Home                 DME Arranged: N/A DME Agency: NA       HH Arranged: RN, PT, OT [place] Agency: [place] ([facility]) Date HH Agency Contacted: 12/12/21 Time [place]: 1255 Representative spoke with at [place]: Natalia ([place]) Interventions    Readmission Risk Interventions No flowsheet data found.

## 2021-12-13 NOTE — Progress Notes (Signed)
Initial Nutrition Assessment  DOCUMENTATION CODES:  Obesity unspecified  INTERVENTION:  Add Ensure Plus High Protein po BID, each supplement provides 350 kcal and 20 grams of protein.   Continue MVI with minerals daily.  Encourage PO and supplement intake.  NUTRITION DIAGNOSIS:  Increased nutrient needs related to acute illness (severe sepsis 2/2 UTI) as evidenced by estimated needs.  GOAL:  Patient will meet greater than or equal to 90% of their needs  MONITOR:  PO intake, Supplement acceptance, Labs, Weight trends, Skin, I & O's  REASON FOR ASSESSMENT:  Malnutrition Screening Tool    ASSESSMENT:  76 yo male with a PMH of CKD stage 3b, CAD, CVA, OSA on CPAP, HTN, GI bleed/esophageal ulcer, and nephrolithiasis s/p bilateral J stent on 12/05/2021 presenting with general malaise and fever, and admitted for severe sepsis due to complicated UTI.  Pt reports having a good appetite and was working on a fruit plate while RD met with pt. He reports that his appetite had not really changed recently.  Per Epic, pt ate 0% of breakfast this morning and 50% of lunch today. No other meals documented.  RD suspects pt may not be eating as well as he claims.  Per Epic, pt's weight is very slightly above what pt reports normally weighing. UBW reported at 215 lbs. Pt stated weighing around 210 lbs at current.  RD to add Ensure BID to encourage PO intake.  Medications: reviewed; MVI with minerals, Protonix, NaCl @ 125 ml/hr, Omnipen per IV TID  Labs: reviewed; Na 133 (L), Glucose 109 (H), BUN 75 (H - trending up), Crt 4.38 (H - trending up), Mag 2.5 (H)  NUTRITION - FOCUSED PHYSICAL EXAM: Flowsheet Row Most Recent Value  Orbital Region No depletion  Upper Arm Region No depletion  Thoracic and Lumbar Region No depletion  Buccal Region No depletion  Temple Region No depletion  Clavicle Bone Region No depletion  Clavicle and Acromion Bone Region No depletion  Scapular Bone Region No  depletion  Dorsal Hand No depletion  Patellar Region No depletion  Anterior Thigh Region No depletion  Posterior Calf Region No depletion  Edema (RD Assessment) None  Hair Reviewed  Eyes Reviewed  Mouth Reviewed  Skin Reviewed  Nails Reviewed   Diet Order:   Diet Order             Diet Heart Room service appropriate? Yes; Fluid consistency: Thin  Diet effective now                  EDUCATION NEEDS:  Education needs have been addressed  Skin:  Skin Assessment: Skin Integrity Issues: Skin Integrity Issues:: Incisions Incisions: Penis, closed (1/17)  Last BM:  12/12/21  Height:  Ht Readings from Last 1 Encounters:  12/11/21 5' 9"  (1.753 m)   Weight:  Wt Readings from Last 1 Encounters:  12/13/21 98.2 kg   BMI:  Body mass index is 31.99 kg/m.  Estimated Nutritional Needs:  Kcal:  2100-2300 Protein:  115-130 grams Fluid:  >2.1 L  Derrel Nip, RD, LDN (she/her/hers) Clinical Inpatient Dietitian RD Pager/After-Hours/Weekend Pager # in [place]

## 2021-12-13 NOTE — Progress Notes (Signed)
Occupational Therapy Treatment Patient Details Name: Luis Strong MRN: [id number] DOB: 03/20/1946 Today's Date: 12/13/2021   History of present illness Luis Strong is a 76 y.o. male with medical history significant for CKD 3B, HTN, CAD, CVA, OSA on CPAP, history of GI bleed 2020 with nonbleeding esophageal ulcer on EGD, nephrolithiasis s/p ureteroscopy with bilateral J stent placement on 1/17, who presents to the ED with generalized malaise and fever starting 2 days after his procedure.  He denies abdominal pain, nausea or vomiting and denies diarrhea.  Has no cough, shortness of breath or chest pain.   OT comments  Pt seen for OT treatment on this date. Upon arrival to room, pt awake and seated upright in recliner with son present. Pt with cognition different from yesterday; pt A&Ox3, disoriented to situation. Pt also self-reporting that his memory is different from usual. Pt agreeable to OT tx. Due to decreased awareness of deficits, decreased balance, and decreased activity tolerance, pt currently requires MIN GUARD for seated LB dressing, MIN A for sit>stand transfers, MOD A for functional mobility of short household distances (46ft, 2x) with RW, and MOD A for BSC transfers. While on RA, SpO2> 92% throughout session. Pt is making good progress toward goals, however with current functional impairments, would be unsafe to return home with current cognition and level of functioning. Discharged recommendation changed to SNF; TOC informed. This [place] discussed d/c recommendations with pt and pt's son; pt's son in agreement however pt appearing hesitant to accept recommendation. Pt continues to benefit from skilled OT services to maximize return to PLOF and minimize risk of future falls, injury, caregiver burden, and readmission. Will continue to follow POC.     Recommendations for follow up therapy are one component of a multi-disciplinary discharge planning process, led by the attending  physician.  Recommendations may be updated based on patient status, additional functional criteria and insurance authorization.    Follow Up Recommendations  Skilled nursing-short term rehab (<3 hours/day)    Assistance Recommended at Discharge Frequent or constant Supervision/Assistance  Patient can return home with the following  A lot of help with walking and/or transfers;A lot of help with bathing/dressing/bathroom   Equipment Recommendations  BSC/3in1       Precautions / Restrictions Precautions Precautions: Fall  Restrictions Weight Bearing Restrictions: No       Mobility Bed Mobility               General bed mobility comments: no assessed, pt in recliner pre/post session    Transfers Overall transfer level: Needs assistance Equipment used: Rolling walker (2 wheels) Transfers: Sit to/from Stand Sit to Stand: Min assist                 Balance Overall balance assessment: Needs assistance Sitting-balance support: No upper extremity supported, Feet supported Sitting balance-Leahy Scale: Good Sitting balance - Comments: Requires MIN GUARD for reaching outside BOS to don/doff socks   Standing balance support: Bilateral upper extremity supported, During functional activity, Reliant on assistive device for balance Standing balance-Leahy Scale: Poor Standing balance comment: Requires MIN A to walk in straight line, but MOD A when turning                           ADL either performed or assessed with clinical judgement   ADL Overall ADL's : Needs assistance/impaired     Grooming: Oral care;Set up;Sitting  Lower Body Dressing: Min guard;Sitting/lateral leans Lower Body Dressing Details (indicate cue type and reason): Requires increased effort and time (x1 seated rest break) when donning/doffing socks Toilet Transfer: Moderate assistance;Stand-pivot;BSC/3in1;Rolling walker (2 wheels)           Functional mobility during  ADLs: Minimal assistance;Moderate assistance;Rolling walker (2 wheels) (to walk 81ft, x2)        Cognition Arousal/Alertness: Awake/alert Behavior During Therapy: WFL for tasks assessed/performed Overall Cognitive Status: Impaired/Different from baseline Area of Impairment: Awareness, Safety/judgement, Problem solving                         Safety/Judgement: Decreased awareness of safety, Decreased awareness of deficits Awareness: Intellectual, Emergent   General Comments: Pt alert and oriented to self, place, and month/year. Disoriented to situation. Pt endorses that his memory is different from baseline              General Comments SpO2 > 92% throughout    Pertinent Vitals/ Pain       Pain Assessment Pain Assessment: No/denies pain         Frequency  Min 3X/week        Progress Toward Goals  OT Goals(current goals can now be found in the care plan section)  Progress towards OT goals: Progressing toward goals  Acute Rehab OT Goals Patient Stated Goal: to regain independence OT Goal Formulation: With patient Time For Goal Achievement: 12/26/21 Potential to Achieve Goals: Good  Plan Discharge plan needs to be updated;Frequency remains appropriate       AM-PAC OT "6 Clicks" Daily Activity     Outcome Measure   Help from another person eating meals?: None Help from another person taking care of personal grooming?: A Little Help from another person toileting, which includes using toliet, bedpan, or urinal?: A Lot Help from another person bathing (including washing, rinsing, drying)?: A Lot Help from another person to put on and taking off regular upper body clothing?: A Little Help from another person to put on and taking off regular lower body clothing?: A Lot 6 Click Score: 16    End of Session Equipment Utilized During Treatment: Rolling walker (2 wheels)  OT Visit Diagnosis: Unsteadiness on feet (R26.81);History of falling (Z91.81)    Activity Tolerance Patient tolerated treatment well   Patient Left in chair;with call bell/phone within reach;with chair alarm set;with family/visitor present   Nurse Communication Mobility status        Time: 1400-1425 OT Time Calculation (min): 25 min  Charges: OT General Charges $OT Visit: 1 Visit OT Treatments $Self Care/Home Management : 23-37 mins  Fredirick Maudlin, OTR/L [place]

## 2021-12-13 NOTE — Progress Notes (Signed)
Physical Therapy Treatment Patient Details Name: Luis Strong MRN: [id number] DOB: 1946-01-01 Today's Date: 12/13/2021   History of Present Illness Luis Strong is a 76 y.o. male with medical history significant for CKD 3B, HTN, CAD, CVA, OSA on CPAP, history of GI bleed 2020 with nonbleeding esophageal ulcer on EGD, nephrolithiasis s/p ureteroscopy with bilateral J stent placement on 1/17, who presents to the ED with generalized malaise and fever starting 2 days after his procedure.  He denies abdominal pain, nausea or vomiting and denies diarrhea.  Has no cough, shortness of breath or chest pain.    PT Comments    Patient received in bed, son at bedside. Patient reports he is feeling better. He requires min guard for bed mobility and min +2 assist for sit to stand. Attempted to stand with +1, but was not safe. Patient was able to walk about 5 feet from bed to recliner with +2 min assist. He continues to be unsteady, shaky with mobility and has decreased safety awareness. Discharge plan has been updated to SNF as he would be unsafe to return home at this time.      Recommendations for follow up therapy are one component of a multi-disciplinary discharge planning process, led by the attending physician.  Recommendations may be updated based on patient status, additional functional criteria and insurance authorization.  Follow Up Recommendations  Skilled nursing-short term rehab (<3 hours/day)     Assistance Recommended at Discharge Frequent or constant Supervision/Assistance  Patient can return home with the following A little help with bathing/dressing/bathroom;Assistance with cooking/housework;Assist for transportation;Help with stairs or ramp for entrance;A lot of help with walking and/or transfers   Equipment Recommendations  Rolling walker (2 wheels)    Recommendations for Other Services       Precautions / Restrictions Precautions Precautions: Fall Precaution Comments:  patient shaky and weak Restrictions Weight Bearing Restrictions: No     Mobility  Bed Mobility Overal bed mobility: Modified Independent Bed Mobility: Supine to Sit     Supine to sit: Min assist     General bed mobility comments: Patient progressing this session. Increased independence with bed mobility.    Transfers Overall transfer level: Needs assistance Equipment used: 2 person hand held assist Transfers: Sit to/from Stand Sit to Stand: Min assist, +2 physical assistance, From elevated surface           General transfer comment: MinA for sit to stand. Patient requires Wide BOS and tried to reach for IV pole to assist in standing. Decreased awareness    Ambulation/Gait Ambulation/Gait assistance: Min assist, +2 physical assistance Gait Distance (Feet): 6 Feet Assistive device: 2 person hand held assist Gait Pattern/deviations: Step-to pattern, Decreased step length - right, Decreased step length - left, Wide base of support, Shuffle Gait velocity: decr     General Gait Details: Patient able to go a few feet from bed to recliner   Stairs             Wheelchair Mobility    Modified Rankin (Stroke Patients Only)       Balance Overall balance assessment: Needs assistance Sitting-balance support: Feet supported Sitting balance-Leahy Scale: Good     Standing balance support: Bilateral upper extremity supported, During functional activity Standing balance-Leahy Scale: Fair Standing balance comment: requires MIN A+2 via B UE support to take steps  Cognition Arousal/Alertness: Awake/alert Behavior During Therapy: WFL for tasks assessed/performed Overall Cognitive Status: Impaired/Different from baseline Area of Impairment: Awareness, Safety/judgement, Problem solving                         Safety/Judgement: Decreased awareness of safety, Decreased awareness of deficits Awareness: Intellectual Problem  Solving: Requires verbal cues, Requires tactile cues General Comments: patient continues to seem a bit "off".        Exercises      General Comments        Pertinent Vitals/Pain Pain Assessment Pain Assessment: No/denies pain    Home Living                          Prior Function            PT Goals (current goals can now be found in the care plan section) Acute Rehab PT Goals Patient Stated Goal: to feel better PT Goal Formulation: With patient Time For Goal Achievement: 12/26/21 Potential to Achieve Goals: Good Progress towards PT goals: Progressing toward goals    Frequency    Min 2X/week      PT Plan Discharge plan needs to be updated    Co-evaluation              AM-PAC PT "6 Clicks" Mobility   Outcome Measure  Help needed turning from your back to your side while in a flat bed without using bedrails?: A Little Help needed moving from lying on your back to sitting on the side of a flat bed without using bedrails?: A Little Help needed moving to and from a bed to a chair (including a wheelchair)?: A Lot Help needed standing up from a chair using your arms (e.g., wheelchair or bedside chair)?: A Lot Help needed to walk in hospital room?: A Lot Help needed climbing 3-5 steps with a railing? : Total 6 Click Score: 13    End of Session Equipment Utilized During Treatment: Gait belt Activity Tolerance: Patient limited by fatigue Patient left: in chair;with call bell/phone within reach;with chair alarm set;with family/visitor present Nurse Communication: Mobility status PT Visit Diagnosis: Unsteadiness on feet (R26.81);Other abnormalities of gait and mobility (R26.89);Muscle weakness (generalized) (M62.81);Difficulty in walking, not elsewhere classified (R26.2)     Time: 1324-4010 PT Time Calculation (min) (ACUTE ONLY): 26 min  Charges:  $Gait Training: 23-37 mins                     Lindey Renzulli, PT, GCS 12/13/21,1:21 PM

## 2021-12-13 NOTE — Progress Notes (Signed)
Pt is A&O x3, with intermittent confusion on shift, pt keeps pulling leads off, pulled right wrist IV out, and questions where he is at.

## 2021-12-14 DIAGNOSIS — T796XXA Traumatic ischemia of muscle, initial encounter: Secondary | ICD-10-CM | POA: Diagnosis not present

## 2021-12-14 DIAGNOSIS — N390 Urinary tract infection, site not specified: Secondary | ICD-10-CM | POA: Diagnosis not present

## 2021-12-14 DIAGNOSIS — N179 Acute kidney failure, unspecified: Secondary | ICD-10-CM | POA: Diagnosis not present

## 2021-12-14 DIAGNOSIS — R7881 Bacteremia: Secondary | ICD-10-CM | POA: Diagnosis not present

## 2021-12-14 DIAGNOSIS — B952 Enterococcus as the cause of diseases classified elsewhere: Secondary | ICD-10-CM | POA: Diagnosis not present

## 2021-12-14 LAB — CBC WITH DIFFERENTIAL/PLATELET
Abs Immature Granulocytes: 0.09 10*3/uL — ABNORMAL HIGH (ref 0.00–0.07)
Basophils Absolute: 0 10*3/uL (ref 0.0–0.1)
Basophils Relative: 0 %
Eosinophils Absolute: 0.2 10*3/uL (ref 0.0–0.5)
Eosinophils Relative: 2 %
HCT: 39.4 % (ref 39.0–52.0)
Hemoglobin: 13.5 g/dL (ref 13.0–17.0)
Immature Granulocytes: 1 %
Lymphocytes Relative: 5 %
Lymphs Abs: 0.5 10*3/uL — ABNORMAL LOW (ref 0.7–4.0)
MCH: 28.8 pg (ref 26.0–34.0)
MCHC: 34.3 g/dL (ref 30.0–36.0)
MCV: 84.2 fL (ref 80.0–100.0)
Monocytes Absolute: 0.8 10*3/uL (ref 0.1–1.0)
Monocytes Relative: 8 %
Neutro Abs: 8.6 10*3/uL — ABNORMAL HIGH (ref 1.7–7.7)
Neutrophils Relative %: 84 %
Platelets: 96 10*3/uL — ABNORMAL LOW (ref 150–400)
RBC: 4.68 MIL/uL (ref 4.22–5.81)
RDW: 14.5 % (ref 11.5–15.5)
WBC: 10.2 10*3/uL (ref 4.0–10.5)
nRBC: 0 % (ref 0.0–0.2)

## 2021-12-14 LAB — URINE CULTURE: Culture: >=100000 — AB

## 2021-12-14 LAB — COMPREHENSIVE METABOLIC PANEL
ALT: 192 U/L — ABNORMAL HIGH (ref 0–44)
AST: 262 U/L — ABNORMAL HIGH (ref 15–41)
Albumin: 2.5 g/dL — ABNORMAL LOW (ref 3.5–5.0)
Alkaline Phosphatase: 42 U/L (ref 38–126)
Anion gap: 8 (ref 5–15)
BUN: 85 mg/dL — ABNORMAL HIGH (ref 8–23)
CO2: 19 mmol/L — ABNORMAL LOW (ref 22–32)
Calcium: 7.8 mg/dL — ABNORMAL LOW (ref 8.9–10.3)
Chloride: 107 mmol/L (ref 98–111)
Creatinine, Ser: 4.17 mg/dL — ABNORMAL HIGH (ref 0.61–1.24)
GFR, Estimated: 14 mL/min — ABNORMAL LOW (ref >60)
Glucose, Bld: 115 mg/dL — ABNORMAL HIGH (ref 70–99)
Potassium: 4 mmol/L (ref 3.5–5.1)
Sodium: 134 mmol/L — ABNORMAL LOW (ref 135–145)
Total Bilirubin: 1 mg/dL (ref 0.3–1.2)
Total Protein: 5.6 g/dL — ABNORMAL LOW (ref 6.5–8.1)

## 2021-12-14 LAB — CULTURE, BLOOD (ROUTINE X 2): Special Requests: ADEQUATE

## 2021-12-14 LAB — CK: Total CK: 4567 U/L — ABNORMAL HIGH (ref 49–397)

## 2021-12-14 MED ORDER — DILTIAZEM HCL ER COATED BEADS 180 MG PO CP24
180.0000 mg | ORAL_CAPSULE | Freq: Every day | ORAL | Status: DC
  Administered 2021-12-14 – 2021-12-22 (×9): 180 mg via ORAL
  Filled 2021-12-14 (×9): qty 1

## 2021-12-14 NOTE — Progress Notes (Signed)
Physical Therapy Treatment Patient Details Name: Luis Strong MRN: [id number] DOB: Aug 03, 1946 Today's Date: 12/14/2021   History of Present Illness Luis Strong is a 76 y.o. male with medical history significant for CKD 3B, HTN, CAD, CVA, OSA on CPAP, history of GI bleed 2020 with nonbleeding esophageal ulcer on EGD, nephrolithiasis s/p ureteroscopy with bilateral J stent placement on 1/17, who presents to the ED with generalized malaise and fever starting 2 days after his procedure.  He denies abdominal pain, nausea or vomiting and denies diarrhea.  Has no cough, shortness of breath or chest pain.    PT Comments    Patient received in bed, agrees to PT session. Speaking is slightly garbled, disjointed.  Seemed disoriented by lines and situation.  He required mod assist to go from supine to sitting at edge of bed. Able to sit independently. Min guard for sit to stand with RW. Patient ambulated 25 feet in room, very distracted by lines, letting go of walker to try to manage lines requiring frequent cues for safety. He will continue to benefit from skilled PT while here to improve safety, strength and independence. Patient still not cognitively at baseline for safe return home independently.    Recommendations for follow up therapy are one component of a multi-disciplinary discharge planning process, led by the attending physician.  Recommendations may be updated based on patient status, additional functional criteria and insurance authorization.  Follow Up Recommendations  Skilled nursing-short term rehab (<3 hours/day)     Assistance Recommended at Discharge Frequent or constant Supervision/Assistance  Patient can return home with the following A little help with bathing/dressing/bathroom;Assistance with cooking/housework;Assist for transportation;Help with stairs or ramp for entrance;A lot of help with walking and/or transfers   Equipment Recommendations  Rolling walker (2 wheels)     Recommendations for Other Services       Precautions / Restrictions Precautions Precautions: Fall Precaution Comments: patient shaky and weak Restrictions Weight Bearing Restrictions: No     Mobility  Bed Mobility Overal bed mobility: Needs Assistance Bed Mobility: Supine to Sit     Supine to sit: Mod assist     General bed mobility comments: patient weak and required mod assist to raise trunk to seated position. Difficulty sequencing. Requires cues.    Transfers Overall transfer level: Needs assistance Equipment used: Rolling walker (2 wheels) Transfers: Sit to/from Stand Sit to Stand: Min guard           General transfer comment: Improved standing ability this session, but poor awareness, coordination, sequencing.    Ambulation/Gait Ambulation/Gait assistance: Min assist Gait Distance (Feet): 25 Feet Assistive device: Rolling walker (2 wheels) Gait Pattern/deviations: Step-through pattern, Decreased step length - right, Decreased step length - left Gait velocity: decr     General Gait Details: Patient ambualted in room with min assist. Cues needed for safety, to keep hands on walker.   Stairs             Wheelchair Mobility    Modified Rankin (Stroke Patients Only)       Balance Overall balance assessment: Needs assistance Sitting-balance support: Feet supported Sitting balance-Leahy Scale: Good     Standing balance support: Bilateral upper extremity supported, During functional activity, Reliant on assistive device for balance Standing balance-Leahy Scale: Poor Standing balance comment: Min guard for balance, max cues for safety and use of AD  Cognition Arousal/Alertness: Awake/alert Behavior During Therapy: Impulsive, Restless Overall Cognitive Status: Impaired/Different from baseline Area of Impairment: Awareness, Safety/judgement, Problem solving                          Safety/Judgement: Decreased awareness of safety, Decreased awareness of deficits Awareness: Intellectual Problem Solving: Requires verbal cues, Requires tactile cues          Exercises      General Comments        Pertinent Vitals/Pain Pain Assessment Pain Assessment: No/denies pain    Home Living                          Prior Function            PT Goals (current goals can now be found in the care plan section) Acute Rehab PT Goals Patient Stated Goal: to feel better, go home PT Goal Formulation: With patient Time For Goal Achievement: 12/26/21 Potential to Achieve Goals: Fair Progress towards PT goals: Progressing toward goals    Frequency    Min 2X/week      PT Plan Current plan remains appropriate    Co-evaluation              AM-PAC PT "6 Clicks" Mobility   Outcome Measure  Help needed turning from your back to your side while in a flat bed without using bedrails?: A Lot Help needed moving from lying on your back to sitting on the side of a flat bed without using bedrails?: A Lot Help needed moving to and from a bed to a chair (including a wheelchair)?: A Little Help needed standing up from a chair using your arms (e.g., wheelchair or bedside chair)?: A Little Help needed to walk in hospital room?: A Lot Help needed climbing 3-5 steps with a railing? : A Lot 6 Click Score: 14    End of Session Equipment Utilized During Treatment: Gait belt Activity Tolerance: Patient limited by fatigue;Other (comment) (SOB with activity, sats in mid 90%s) Patient left: in chair;with call bell/phone within reach;with chair alarm set Nurse Communication: Mobility status PT Visit Diagnosis: Unsteadiness on feet (R26.81);Other abnormalities of gait and mobility (R26.89);Muscle weakness (generalized) (M62.81);Difficulty in walking, not elsewhere classified (R26.2);History of falling (Z91.81)     Time: 3419-3790 PT Time Calculation (min) (ACUTE  ONLY): 23 min  Charges:  $Gait Training: 23-37 mins                     Laelle Bridgett, PT, GCS 12/14/21,2:44 PM

## 2021-12-14 NOTE — Progress Notes (Signed)
Date of Admission:  12/11/2021     ID: Luis Strong is Strong 76 y.o. male  Principal Problem:   Severe sepsis ([name]) Active Problems:   HTN (hypertension)   OSA on CPAP   Complicated UTI (urinary tract infection)   Abnormal LFTs   Acute kidney injury superimposed on CKD lllb (HCC)   History of GI bleed   History of peptic ulcer   S/P cystoscopy with ureteral stent placement   High anion gap metabolic acidosis   Subjective: Patient is doing better States he walked in the room many times Appetite better  Medications:   aspirin EC  81 mg Oral Daily   citalopram  40 mg Oral Daily   darifenacin  7.5 mg Oral Daily   diltiazem  180 mg Oral Daily   feeding supplement  237 mL Oral BID BM   metoprolol tartrate  25 mg Oral BID   multivitamin with minerals  1 tablet Oral Daily   pantoprazole  40 mg Oral Daily    Objective: Vital signs in last 24 hours: Temp:  [97.7 F (36.5 C)-99.4 F (37.4 C)] 98.4 F (36.9 C) (01/26 2011) Pulse Rate:  [59-117] 79 (01/26 2011) Resp:  [16-29] 18 (01/26 2011) BP: (109-149)/(49-98) 139/77 (01/26 2011) SpO2:  [95 %-100 %] 100 % (01/26 2011) Weight:  [96.5 kg] 96.5 kg (01/26 0500)  PHYSICAL EXAM:  General: Alert, cooperative, no distress,  Lungs: Bilateral air entry.  Crypts in the bases.  S. Heart: Regular rate and rhythm, no murmur, rub or gallop. Abdomen: Soft, non-tender,not distended. Bowel sounds normal. No masses Extremities: bruising left knee Skin: No rashes or lesions. Or bruising Lymph: Cervical, supraclavicular normal. Neurologic: Grossly non-focal  Lab Results Recent Labs    12/13/21 0629 12/14/21 0450  WBC 9.1 10.2  HGB 14.2 13.5  HCT 43.3 39.4  NA 133* 134*  K 4.1 4.0  CL 104 107  CO2 18* 19*  BUN 75* 85*  CREATININE 4.38* 4.17*   Liver Panel Recent Labs    12/13/21 0629 12/14/21 0450  PROT 6.0* 5.6*  ALBUMIN 2.9* 2.5*  AST 390* 262*  ALT 200* 192*  ALKPHOS 36* 42  BILITOT 1.4* 1.0   Microbiology: 1/23 BC/UC- enterococcus fecalis 12/14/2021 repeat blood culture sent Studies/Results: ECHOCARDIOGRAM COMPLETE  Result Date: 12/13/2021    ECHOCARDIOGRAM REPORT   Patient Name:   Luis Strong Date of Exam: 12/12/2021 Medical Rec #:  [id number]         Height:       69.0 in Accession #:    [id number]        Weight:       210.0 lb Date of Birth:  08/21/1946        BSA:          2.109 m Patient Age:    86 years          BP:           120/68 mmHg Patient Gender: M                 HR:           108 bpm. Exam Location:  [facility] Procedure: 2D Echo, Cardiac Doppler and Color Doppler Indications:     R78.81 Bacteremia  History:         Patient has no prior history of Echocardiogram examinations.                  Stroke; Risk  Factors:Hypertension, Dyslipidemia and Sleep                  Apnea.  Sonographer:     Cresenciano Lick RDCS Referring Phys:  [id number] Charlesetta Ivory GONFA Diagnosing Phys: Nelva Bush MD IMPRESSIONS  1. Left ventricular ejection fraction, by estimation, is 55 to 60%. The left ventricle has normal function. The left ventricle has no regional wall motion abnormalities. Left ventricular diastolic parameters are indeterminate.  2. Right ventricular systolic function is normal. The right ventricular size is normal.  3. The mitral valve is degenerative. Trivial mitral valve regurgitation. No evidence of mitral stenosis.  4. The aortic valve is tricuspid. There is mild thickening of the aortic valve. Aortic valve regurgitation is mild. Aortic valve sclerosis is present, with no evidence of aortic valve stenosis.  5. Aortic dilatation noted. There is borderline dilatation of the aortic root, measuring 38 mm. There is mild dilatation of the ascending aorta, measuring 38 mm.  6. The inferior vena cava is normal in size with <50% respiratory variability, suggesting right atrial pressure of 8 mmHg. Conclusion(s)/Recommendation(s): There is no definite evidence of Strong vegetation. However, native  valvular disease is present. Consider transesophageal echocardiogram if clinical concern for endocarditis persists. FINDINGS  Left Ventricle: Left ventricular ejection fraction, by estimation, is 55 to 60%. The left ventricle has normal function. The left ventricle has no regional wall motion abnormalities. The left ventricular internal cavity size was normal in size. There is  no left ventricular hypertrophy. Left ventricular diastolic parameters are indeterminate. Right Ventricle: The right ventricular size is normal. No increase in right ventricular wall thickness. Right ventricular systolic function is normal. Left Atrium: Left atrial size was normal in size. Right Atrium: Right atrial size was normal in size. Pericardium: There is no evidence of pericardial effusion. Mitral Valve: The mitral valve is degenerative in appearance. There is mild thickening of the mitral valve leaflet(s). There is mild calcification of the mitral valve leaflet(s). Trivial mitral valve regurgitation. No evidence of mitral valve stenosis. Tricuspid Valve: The tricuspid valve is normal in structure. Tricuspid valve regurgitation is trivial. Aortic Valve: The aortic valve is tricuspid. There is mild thickening of the aortic valve. Aortic valve regurgitation is mild. Aortic valve sclerosis is present, with no evidence of aortic valve stenosis. Pulmonic Valve: The pulmonic valve was not well visualized. Pulmonic valve regurgitation is not visualized. No evidence of pulmonic stenosis. Aorta: Aortic dilatation noted. There is borderline dilatation of the aortic root, measuring 38 mm. There is mild dilatation of the ascending aorta, measuring 38 mm. Pulmonary Artery: The pulmonary artery is not well seen. Venous: The inferior vena cava is normal in size with less than 50% respiratory variability, suggesting right atrial pressure of 8 mmHg. IAS/Shunts: No atrial level shunt detected by color flow Doppler.  LEFT VENTRICLE PLAX 2D LVIDd:          4.70 cm LVIDs:         3.40 cm LV PW:         1.00 cm LV IVS:        0.90 cm LVOT diam:     2.10 cm LV SV:         51 LV SV Index:   24 LVOT Area:     3.46 cm  RIGHT VENTRICLE             IVC RV Basal diam:  4.10 cm     IVC diam: 1.50 cm RV S prime:  13.20 cm/s TAPSE (M-mode): 2.0 cm LEFT ATRIUM             Index        RIGHT ATRIUM           Index LA diam:        5.10 cm 2.42 cm/m   RA Area:     13.20 cm LA Vol (A2C):   54.9 ml 26.03 ml/m  RA Volume:   35.90 ml  17.02 ml/m LA Vol (A4C):   48.8 ml 23.14 ml/m LA Biplane Vol: 54.2 ml 25.70 ml/m  AORTIC VALVE LVOT Vmax:   89.40 cm/s LVOT Vmean:  66.500 cm/s LVOT VTI:    0.148 m  AORTA Ao Root diam: 3.80 cm Ao Asc diam:  3.70 cm MV E velocity: 111.50 cm/s                             SHUNTS                             Systemic VTI:  0.15 m                             Systemic Diam: 2.10 cm Nelva Bush MD Electronically signed by Nelva Bush MD Signature Date/Time: 12/13/2021/10:37:34 AM    Final      Assessment/Plan: Enterococcus bacteremia with sepsis secondary complictaed UTI, following laser lithotripsy, stent placement rt side Pt is on IV ampicillin- will need for Strong total of 14 days  Patient does not have any heart device or valve replacement and hence endocarditis is less likely No joint replacements  AKI on CKD- multifactorial sepsis/rhabdomyolysis /pre renal Slight improvement seen    Severe rhabdomyolysis- due to fall and being on the floor --contributing to AKI- management as per primary team.  Is improving  transaminitis due to sepsis/rhabdo Improving Discussed the management with the patient .

## 2021-12-14 NOTE — Progress Notes (Signed)
PROGRESS NOTE    Luis Strong  ZMO:[id number] DOB: 07-30-1946 DOA: 12/11/2021 PCP: Juline Patch, MD    Brief Narrative:  76 year old M with PMH of CKD-3B, CAD, CVA, OSA on CPAP, HTN, GI bleed/esophageal ulcer, and nephrolithiasis s/p bilateral J stent on 12/05/2021 presenting with general malaise and fever, and admitted for severe sepsis due to complicated UTI.  Patient's son found him down on the floor and called EMS. CT renal stone study negative for hydronephrosis or obstruction, and the stents at right place.  Cultures obtained.  Patient was started on IV cefepime.  Urology consulted.   Blood culture with GPC.  Speciation Enterococcus faecalis.  Infectious disease involved.  Patient on IV ampicillin.  Clinically improving.   Assessment & Plan:   Principal Problem:   Severe sepsis ([place]) Active Problems:   HTN (hypertension)   OSA on CPAP   Complicated UTI (urinary tract infection)   Abnormal LFTs   Acute kidney injury superimposed on CKD lllb (HCC)   History of GI bleed   History of peptic ulcer   S/P cystoscopy with ureteral stent placement   High anion gap metabolic acidosis  - Severe sepsis due to bacteremia and complicated UTI and patient with nephrolithiasis s/p bilateral JJ stent on 12/05/2020 -Enterococcus faecalis bacteremia -Elevated liver function test secondary to sepsis  POA.  Had fever, tachycardia, tachypnea, AKI and lactic acidosis.  Culture data as above.  Lactic acidosis resolved.  Pro-Cal elevated to 7.92.  Leukocytosis improving. Kidney function improving Liver function improving Plan: Continue maintenance fluids, normal saline at 125 cc/h On IV ampicillin per ID recommendations, will need 14-day course Monitor vitals and fever curve  Atrial fibrillation with rapid ventricular response New onset Required Cardizem gtt., now weaned off Plan: DC p.o. Cardizem 30 every 6 Start Cardizem CD 180 mg daily Continue home metoprolol Defer  anticoagulation for now as likely A. fib was driven by underlying severe sepsis   AKI on CKD-3B  Likely from rhabdomyolysis and sepsis.   CT renal stone study without hydronephrosis or obstruction Creatinine slightly improved Plan: Continue aggressive IV fluids Monitor UOP Consider nephrology involvement if creatinine plateaus   Anion gap metabolic acidosis  Likely due to renal failure.  Improved. -Continue monitoring   Possible fall at home Rhabdomyolysis CK elevated to 31,000.  Could be traumatic.   He was found down by his patient's son He is also active, working out 6 times weekly CK down to 5000 on 1/26 Plan: Aggressive IVF Hold home statin Therapy evaluations Current recommendation for skilled nursing facility    Elevated liver enzymes/hyperbilirubinemia  Likely due to rhabdomyolysis.  RUQ [place] without acute finding.   Patient is s/p cholecystectomy.  No GI symptoms.  Improving. -IV fluids as above -Daily CMP   Essential hypertension: Normotensive for most part. -Continue home metoprolol   OSA on CPAP -Continue nightly CPAP   History of GI bleed/esophageal ulcer -Continue home Protonix   Thrombocytopenia  Acute on chronic.  Likely due to sepsis. -Discontinue subcu Lovenox -SCD for VTE prophylaxis   DVT prophylaxis: SCD Code Status: Full Family Communication: None today Disposition Plan: Status is: Inpatient  Remains inpatient appropriate because: Severe sepsis, AKI, rhabdomyolysis secondary to enterococcal bacteremia and fall with time down.  Disposition plan pending.   Level of care: Progressive  Consultants:  ID  Procedures:  None  Antimicrobials: Ampicillin   Subjective: Patient seen and examined.  Sitting up in bed.  Endorses symptomatic improvement.  Does endorse weakness  Objective:  Vitals:   12/14/21 0700 12/14/21 1017 12/14/21 1130 12/14/21 1150  BP: 124/78 134/76 109/73 126/67  Pulse: 87 (!) 108 (!) 59 71  Resp: 20   18  Temp:  98.4 F (36.9 C)  97.7 F (36.5 C) 98.4 F (36.9 C)  TempSrc:   Oral Axillary  SpO2: 96%  96% 95%  Weight:      Height:        Intake/Output Summary (Last 24 hours) at 12/14/2021 1205 Last data filed at 12/14/2021 1133 Gross per 24 hour  Intake 3303.44 ml  Output 1350 ml  Net 1953.44 ml   Filed Weights   12/11/21 2106 12/13/21 0441 12/14/21 0500  Weight: 95.3 kg 98.2 kg 96.5 kg    Examination:  General exam: No apparent distress.  Sitting up in bed Respiratory system: Sounds diminished at bases.  Lungs clear.  Normal work of breathing.  2 L Cardiovascular system: S1-S2, RRR, no murmurs, no pedal edema Gastrointestinal system: Soft, NT/ND, normal bowel sounds Central nervous system: Alert and oriented. No focal neurological deficits. Extremities: Diffusely weak bilaterally Skin: No rashes, lesions or ulcers Psychiatry: Judgement and insight appear normal. Mood & affect appropriate.     Data Reviewed: I have personally reviewed following labs and imaging studies  CBC: Recent Labs  Lab 12/11/21 2114 12/12/21 0719 12/13/21 0629 12/14/21 0450  WBC 13.8* 11.6* 9.1 10.2  NEUTROABS 12.5*  --   --  8.6*  HGB 16.7 14.5 14.2 13.5  HCT 49.8 43.7 43.3 39.4  MCV 84.4 85.9 86.4 84.2  PLT 91* 88* 84* 96*   Basic Metabolic Panel: Recent Labs  Lab 12/11/21 2114 12/12/21 0719 12/13/21 0629 12/14/21 0450  NA 132* 135 133* 134*  K 3.9 4.7 4.1 4.0  CL 99 102 104 107  CO2 17* 19* 18* 19*  GLUCOSE 153* 130* 109* 115*  BUN 55* 65* 75* 85*  CREATININE 3.64* 4.16* 4.38* 4.17*  CALCIUM 8.9 8.4* 7.7* 7.8*  MG  --  2.4 2.5*  --   PHOS  --   --  4.5  --    GFR: Estimated Creatinine Clearance: 17.3 mL/min (A) (by C-G formula based on SCr of 4.17 mg/dL (H)). Liver Function Tests: Recent Labs  Lab 12/11/21 2114 12/12/21 0719 12/13/21 0629 12/14/21 0450  AST 815* 618* 390* 262*  ALT 230* 207* 200* 192*  ALKPHOS 55 41 36* 42  BILITOT 2.0* 1.3* 1.4* 1.0  PROT 7.3 6.4* 6.0*  5.6*  ALBUMIN 3.8 3.2* 2.9* 2.5*   No results for input(s): LIPASE, AMYLASE in the last 168 hours. Recent Labs  Lab 12/12/21 1655  AMMONIA 28   Coagulation Profile: Recent Labs  Lab 12/11/21 2114 12/12/21 0719  INR 1.2 1.2   Cardiac Enzymes: Recent Labs  Lab 12/12/21 0719 12/13/21 0629 12/14/21 0450  CKTOTAL 31,396* 13,461* 4,567*   BNP (last 3 results) No results for input(s): PROBNP in the last 8760 hours. HbA1C: No results for input(s): HGBA1C in the last 72 hours. CBG: No results for input(s): GLUCAP in the last 168 hours. Lipid Profile: No results for input(s): CHOL, HDL, LDLCALC, TRIG, CHOLHDL, LDLDIRECT in the last 72 hours. Thyroid Function Tests: No results for input(s): TSH, T4TOTAL, FREET4, T3FREE, THYROIDAB in the last 72 hours. Anemia Panel: No results for input(s): VITAMINB12, FOLATE, FERRITIN, TIBC, IRON, RETICCTPCT in the last 72 hours. Sepsis Labs: Recent Labs  Lab 12/11/21 0045 12/11/21 2113 12/12/21 0719  PROCALCITON  --   --  7.92  LATICACIDVEN 2.0* 2.0*  1.8    Recent Results (from the past 240 hour(s))  Culture, blood (Routine x 2)     Status: Abnormal   Collection Time: 12/11/21  9:13 PM   Specimen: BLOOD  Result Value Ref Range Status   Specimen Description   Final    BLOOD LEFT FOREARM Performed at [facility], [address] [place], [place] [address]    Special Requests   Final    BOTTLES DRAWN AEROBIC AND ANAEROBIC Blood Culture adequate volume Performed at [place] [place], [place] [address]    Culture  Setup Time   Final    GRAM POSITIVE COCCI IN BOTH AEROBIC AND ANAEROBIC BOTTLES CRITICAL RESULT CALLED TO, READ BACK BY AND VERIFIED WITH: MORGAN HICKS 12/12/21 1205 MW    Culture (A)  Final    ENTEROCOCCUS FAECALIS SUSCEPTIBILITIES PERFORMED ON PREVIOUS CULTURE WITHIN THE LAST 5 DAYS. Performed at [facility], [place] [address] [place], [place] [address]    Report Status  12/14/2021 FINAL  Final  Culture, blood (Routine x 2)     Status: Abnormal   Collection Time: 12/11/21  9:13 PM   Specimen: BLOOD  Result Value Ref Range Status   Specimen Description   Final    BLOOD RW Performed at [facility], [address] [place], [place] [address]    Special Requests   Final    BOTTLES DRAWN AEROBIC AND ANAEROBIC BCAV Performed at [facility], [address] [place], [place] [address]    Culture  Setup Time   Final    GRAM POSITIVE COCCI IN BOTH AEROBIC AND ANAEROBIC BOTTLES CRITICAL RESULT CALLED TO, READ BACK BY AND VERIFIED WITH: MORGAN HICKS 12/12/21 1205 MW    Culture ENTEROCOCCUS FAECALIS (A)  Final   Report Status 12/14/2021 FINAL  Final   Organism ID, Bacteria ENTEROCOCCUS FAECALIS  Final      Susceptibility   Enterococcus faecalis - MIC*    AMPICILLIN <=2 SENSITIVE Sensitive     VANCOMYCIN 1 SENSITIVE Sensitive     GENTAMICIN SYNERGY SENSITIVE Sensitive     * ENTEROCOCCUS FAECALIS  Blood Culture ID Panel (Reflexed)     Status: Abnormal   Collection Time: 12/11/21  9:13 PM  Result Value Ref Range Status   Enterococcus faecalis DETECTED (A) NOT DETECTED Final    Comment: CRITICAL RESULT CALLED TO, READ BACK BY AND VERIFIED WITH: MORGAN HICKS 12/12/21 1206 MW    Enterococcus Faecium NOT DETECTED NOT DETECTED Final   Listeria monocytogenes NOT DETECTED NOT DETECTED Final   Staphylococcus species NOT DETECTED NOT DETECTED Final   Staphylococcus aureus (BCID) NOT DETECTED NOT DETECTED Final   Staphylococcus epidermidis NOT DETECTED NOT DETECTED Final   Staphylococcus lugdunensis NOT DETECTED NOT DETECTED Final   Streptococcus species NOT DETECTED NOT DETECTED Final   Streptococcus agalactiae NOT DETECTED NOT DETECTED Final   Streptococcus pneumoniae NOT DETECTED NOT DETECTED Final   Streptococcus pyogenes NOT DETECTED NOT DETECTED Final   A.calcoaceticus-baumannii NOT DETECTED NOT DETECTED Final   Bacteroides fragilis NOT  DETECTED NOT DETECTED Final   Enterobacterales NOT DETECTED NOT DETECTED Final   Enterobacter cloacae complex NOT DETECTED NOT DETECTED Final   Escherichia coli NOT DETECTED NOT DETECTED Final   Klebsiella aerogenes NOT DETECTED NOT DETECTED Final   Klebsiella oxytoca NOT DETECTED NOT DETECTED Final   Klebsiella pneumoniae NOT DETECTED NOT DETECTED Final   Proteus species NOT DETECTED NOT DETECTED Final   Salmonella species NOT  DETECTED NOT DETECTED Final   Serratia marcescens NOT DETECTED NOT DETECTED Final   Haemophilus influenzae NOT DETECTED NOT DETECTED Final   Neisseria meningitidis NOT DETECTED NOT DETECTED Final   Pseudomonas aeruginosa NOT DETECTED NOT DETECTED Final   Stenotrophomonas maltophilia NOT DETECTED NOT DETECTED Final   Candida albicans NOT DETECTED NOT DETECTED Final   Candida auris NOT DETECTED NOT DETECTED Final   Candida glabrata NOT DETECTED NOT DETECTED Final   Candida krusei NOT DETECTED NOT DETECTED Final   Candida parapsilosis NOT DETECTED NOT DETECTED Final   Candida tropicalis NOT DETECTED NOT DETECTED Final   Cryptococcus neoformans/gattii NOT DETECTED NOT DETECTED Final   Vancomycin resistance NOT DETECTED NOT DETECTED Final    Comment: Performed at [facility], [address] [place], [place] [address]  Resp Panel by RT-PCR (Flu A&B, Covid) Nasopharyngeal Swab     Status: None   Collection Time: 12/11/21  9:14 PM   Specimen: Nasopharyngeal Swab; Nasopharyngeal(NP) swabs in vial transport medium  Result Value Ref Range Status   SARS Coronavirus 2 by RT PCR NEGATIVE NEGATIVE Final    Comment: (NOTE) SARS-CoV-2 target nucleic acids are NOT DETECTED.  The SARS-CoV-2 RNA is generally detectable in upper respiratory specimens during the acute phase of infection. The lowest concentration of SARS-CoV-2 viral copies this assay can detect is 138 copies/mL. A negative result does not preclude SARS-Cov-2 infection and should not be used as the sole  basis for treatment or other patient management decisions. A negative result may occur with  improper specimen collection/handling, submission of specimen other than nasopharyngeal swab, presence of viral mutation(s) within the areas targeted by this assay, and inadequate number of viral copies(<138 copies/mL). A negative result must be combined with clinical observations, patient history, and epidemiological information. The expected result is Negative.  Fact Sheet for Patients:  EntrepreneurPulse.com.au  Fact Sheet for Healthcare Providers:  IncredibleEmployment.be  This test is no t yet approved or cleared by the [place] FDA and  has been authorized for detection and/or diagnosis of SARS-CoV-2 by FDA under an Emergency Use Authorization (EUA). This EUA will remain  in effect (meaning this test can be used) for the duration of the COVID-19 declaration under Section 564(b)(1) of the Act, 21 U.S.C.section 360bbb-3(b)(1), unless the authorization is terminated  or revoked sooner.       Influenza A by PCR NEGATIVE NEGATIVE Final   Influenza B by PCR NEGATIVE NEGATIVE Final    Comment: (NOTE) The Xpert Xpress SARS-CoV-2/FLU/RSV plus assay is intended as an aid in the diagnosis of influenza from Nasopharyngeal swab specimens and should not be used as a sole basis for treatment. Nasal washings and aspirates are unacceptable for Xpert Xpress SARS-CoV-2/FLU/RSV testing.  Fact Sheet for Patients: EntrepreneurPulse.com.au  Fact Sheet for Healthcare Providers: IncredibleEmployment.be  This test is not yet approved or cleared by the [place] FDA and has been authorized for detection and/or diagnosis of SARS-CoV-2 by FDA under an Emergency Use Authorization (EUA). This EUA will remain in effect (meaning this test can be used) for the duration of the COVID-19 declaration under Section 564(b)(1) of the Act,  21 U.S.C. section 360bbb-3(b)(1), unless the authorization is terminated or revoked.  Performed at [facility], [address], [place], [place] [address]   Urine Culture     Status: Abnormal   Collection Time: 12/11/21 10:27 PM   Specimen: Urine, Random  Result Value Ref Range Status   Specimen Description   Final  URINE, RANDOM Performed at [facility], [address] [place], [place] [address]    Special Requests   Final    NONE Performed at [facility], [address] [place], [place] [address]    Culture >=100,000 COLONIES/mL ENTEROCOCCUS FAECALIS (A)  Final   Report Status 12/14/2021 FINAL  Final   Organism ID, Bacteria ENTEROCOCCUS FAECALIS (A)  Final      Susceptibility   Enterococcus faecalis - MIC*    AMPICILLIN <=2 SENSITIVE Sensitive     NITROFURANTOIN <=16 SENSITIVE Sensitive     VANCOMYCIN 1 SENSITIVE Sensitive     * >=100,000 COLONIES/mL ENTEROCOCCUS FAECALIS  CULTURE, BLOOD (ROUTINE X 2) w Reflex to ID Panel     Status: None (Preliminary result)   Collection Time: 12/14/21  4:50 AM   Specimen: BLOOD  Result Value Ref Range Status   Specimen Description BLOOD BLOOD RIGHT WRIST  Final   Special Requests   Final    BOTTLES DRAWN AEROBIC AND ANAEROBIC Blood Culture adequate volume   Culture   Final    NO GROWTH <12 HOURS Performed at [facility], [address] [place], [place] [address]    Report Status PENDING  Incomplete  CULTURE, BLOOD (ROUTINE X 2) w Reflex to ID Panel     Status: None (Preliminary result)   Collection Time: 12/14/21  4:50 AM   Specimen: BLOOD  Result Value Ref Range Status   Specimen Description BLOOD BLOOD RIGHT HAND  Final   Special Requests   Final    BOTTLES DRAWN AEROBIC AND ANAEROBIC Blood Culture adequate volume   Culture   Final    NO GROWTH <12 HOURS Performed at [facility], [address] [place], [place] [address]    Report Status PENDING  Incomplete          Radiology Studies: DG Chest Port 1 View  Result Date: 12/12/2021 CLINICAL DATA:  Tachypnea.  Weakness. EXAM: PORTABLE CHEST 1 VIEW COMPARISON:  12/11/2021 FINDINGS: Midline trachea. Normal heart size for level of inspiration. No pleural effusion or pneumothorax. Moderate pulmonary interstitial thickening is new or increased. No lobar consolidation. Mild left base scarring or subsegmental atelectasis. IMPRESSION: Moderate pulmonary interstitial thickening, new or increased. In this never smoker, considerations include mild pulmonary venous congestion or viral/atypical/mycoplasma pneumonia. Electronically Signed   By: Abigail Miyamoto M.D.   On: 12/12/2021 16:50   ECHOCARDIOGRAM COMPLETE  Result Date: 12/13/2021    ECHOCARDIOGRAM REPORT   Patient Name:   Luis Strong Date of Exam: 12/12/2021 Medical Rec #:  [id number]         Height:       69.0 in Accession #:    [id number]        Weight:       210.0 lb Date of Birth:  Feb 05, 1946        BSA:          2.109 m Patient Age:    65 years          BP:           120/68 mmHg Patient Gender: M                 HR:           108 bpm. Exam Location:  [facility] Procedure: 2D Echo, Cardiac Doppler and Color Doppler Indications:     R78.81 Bacteremia  History:         Patient has no prior history  of Echocardiogram examinations.                  Stroke; Risk Factors:Hypertension, Dyslipidemia and Sleep                  Apnea.  Sonographer:     Cresenciano Lick RDCS Referring Phys:  [id number] Charlesetta Ivory GONFA Diagnosing Phys: Nelva Bush MD IMPRESSIONS  1. Left ventricular ejection fraction, by estimation, is 55 to 60%. The left ventricle has normal function. The left ventricle has no regional wall motion abnormalities. Left ventricular diastolic parameters are indeterminate.  2. Right ventricular systolic function is normal. The right ventricular size is normal.  3. The mitral valve is degenerative. Trivial mitral valve regurgitation. No evidence of mitral stenosis.   4. The aortic valve is tricuspid. There is mild thickening of the aortic valve. Aortic valve regurgitation is mild. Aortic valve sclerosis is present, with no evidence of aortic valve stenosis.  5. Aortic dilatation noted. There is borderline dilatation of the aortic root, measuring 38 mm. There is mild dilatation of the ascending aorta, measuring 38 mm.  6. The inferior vena cava is normal in size with <50% respiratory variability, suggesting right atrial pressure of 8 mmHg. Conclusion(s)/Recommendation(s): There is no definite evidence of a vegetation. However, native valvular disease is present. Consider transesophageal echocardiogram if clinical concern for endocarditis persists. FINDINGS  Left Ventricle: Left ventricular ejection fraction, by estimation, is 55 to 60%. The left ventricle has normal function. The left ventricle has no regional wall motion abnormalities. The left ventricular internal cavity size was normal in size. There is  no left ventricular hypertrophy. Left ventricular diastolic parameters are indeterminate. Right Ventricle: The right ventricular size is normal. No increase in right ventricular wall thickness. Right ventricular systolic function is normal. Left Atrium: Left atrial size was normal in size. Right Atrium: Right atrial size was normal in size. Pericardium: There is no evidence of pericardial effusion. Mitral Valve: The mitral valve is degenerative in appearance. There is mild thickening of the mitral valve leaflet(s). There is mild calcification of the mitral valve leaflet(s). Trivial mitral valve regurgitation. No evidence of mitral valve stenosis. Tricuspid Valve: The tricuspid valve is normal in structure. Tricuspid valve regurgitation is trivial. Aortic Valve: The aortic valve is tricuspid. There is mild thickening of the aortic valve. Aortic valve regurgitation is mild. Aortic valve sclerosis is present, with no evidence of aortic valve stenosis. Pulmonic Valve: The  pulmonic valve was not well visualized. Pulmonic valve regurgitation is not visualized. No evidence of pulmonic stenosis. Aorta: Aortic dilatation noted. There is borderline dilatation of the aortic root, measuring 38 mm. There is mild dilatation of the ascending aorta, measuring 38 mm. Pulmonary Artery: The pulmonary artery is not well seen. Venous: The inferior vena cava is normal in size with less than 50% respiratory variability, suggesting right atrial pressure of 8 mmHg. IAS/Shunts: No atrial level shunt detected by color flow Doppler.  LEFT VENTRICLE PLAX 2D LVIDd:         4.70 cm LVIDs:         3.40 cm LV PW:         1.00 cm LV IVS:        0.90 cm LVOT diam:     2.10 cm LV SV:         51 LV SV Index:   24 LVOT Area:     3.46 cm  RIGHT VENTRICLE  IVC RV Basal diam:  4.10 cm     IVC diam: 1.50 cm RV S prime:     13.20 cm/s TAPSE (M-mode): 2.0 cm LEFT ATRIUM             Index        RIGHT ATRIUM           Index LA diam:        5.10 cm 2.42 cm/m   RA Area:     13.20 cm LA Vol (A2C):   54.9 ml 26.03 ml/m  RA Volume:   35.90 ml  17.02 ml/m LA Vol (A4C):   48.8 ml 23.14 ml/m LA Biplane Vol: 54.2 ml 25.70 ml/m  AORTIC VALVE LVOT Vmax:   89.40 cm/s LVOT Vmean:  66.500 cm/s LVOT VTI:    0.148 m  AORTA Ao Root diam: 3.80 cm Ao Asc diam:  3.70 cm MV E velocity: 111.50 cm/s                             SHUNTS                             Systemic VTI:  0.15 m                             Systemic Diam: 2.10 cm Nelva Bush MD Electronically signed by Nelva Bush MD Signature Date/Time: 12/13/2021/10:37:34 AM    Final         Scheduled Meds:  aspirin EC  81 mg Oral Daily   citalopram  40 mg Oral Daily   darifenacin  7.5 mg Oral Daily   diltiazem  180 mg Oral Daily   feeding supplement  237 mL Oral BID BM   metoprolol tartrate  25 mg Oral BID   multivitamin with minerals  1 tablet Oral Daily   pantoprazole  40 mg Oral Daily   Continuous Infusions:  sodium chloride 125 mL/hr at 12/14/21  0204   ampicillin (OMNIPEN) IV 2 g (12/14/21 0621)     LOS: 2 days    Time spent: 35 minutes    Sidney Ace, MD [facility]   If 7PM-7AM, please contact night-coverage  12/14/2021, 12:05 PM

## 2021-12-15 DIAGNOSIS — N390 Urinary tract infection, site not specified: Secondary | ICD-10-CM | POA: Diagnosis not present

## 2021-12-15 DIAGNOSIS — T796XXA Traumatic ischemia of muscle, initial encounter: Secondary | ICD-10-CM | POA: Diagnosis not present

## 2021-12-15 DIAGNOSIS — N179 Acute kidney failure, unspecified: Secondary | ICD-10-CM | POA: Diagnosis not present

## 2021-12-15 DIAGNOSIS — R7881 Bacteremia: Secondary | ICD-10-CM | POA: Diagnosis not present

## 2021-12-15 DIAGNOSIS — B952 Enterococcus as the cause of diseases classified elsewhere: Secondary | ICD-10-CM | POA: Diagnosis not present

## 2021-12-15 LAB — CBC WITH DIFFERENTIAL/PLATELET
Abs Immature Granulocytes: 0.11 10*3/uL — ABNORMAL HIGH (ref 0.00–0.07)
Basophils Absolute: 0 10*3/uL (ref 0.0–0.1)
Basophils Relative: 0 %
Eosinophils Absolute: 0.3 10*3/uL (ref 0.0–0.5)
Eosinophils Relative: 3 %
HCT: 41.9 % (ref 39.0–52.0)
Hemoglobin: 13.8 g/dL (ref 13.0–17.0)
Immature Granulocytes: 1 %
Lymphocytes Relative: 6 %
Lymphs Abs: 0.6 10*3/uL — ABNORMAL LOW (ref 0.7–4.0)
MCH: 28.7 pg (ref 26.0–34.0)
MCHC: 32.9 g/dL (ref 30.0–36.0)
MCV: 87.1 fL (ref 80.0–100.0)
Monocytes Absolute: 0.8 10*3/uL (ref 0.1–1.0)
Monocytes Relative: 8 %
Neutro Abs: 8.2 10*3/uL — ABNORMAL HIGH (ref 1.7–7.7)
Neutrophils Relative %: 82 %
Platelets: 104 10*3/uL — ABNORMAL LOW (ref 150–400)
RBC: 4.81 MIL/uL (ref 4.22–5.81)
RDW: 14.6 % (ref 11.5–15.5)
WBC: 9.9 10*3/uL (ref 4.0–10.5)
nRBC: 0 % (ref 0.0–0.2)

## 2021-12-15 LAB — COMPREHENSIVE METABOLIC PANEL
ALT: 168 U/L — ABNORMAL HIGH (ref 0–44)
AST: 166 U/L — ABNORMAL HIGH (ref 15–41)
Albumin: 2.6 g/dL — ABNORMAL LOW (ref 3.5–5.0)
Alkaline Phosphatase: 45 U/L (ref 38–126)
Anion gap: 8 (ref 5–15)
BUN: 77 mg/dL — ABNORMAL HIGH (ref 8–23)
CO2: 20 mmol/L — ABNORMAL LOW (ref 22–32)
Calcium: 7.7 mg/dL — ABNORMAL LOW (ref 8.9–10.3)
Chloride: 108 mmol/L (ref 98–111)
Creatinine, Ser: 3.6 mg/dL — ABNORMAL HIGH (ref 0.61–1.24)
GFR, Estimated: 17 mL/min — ABNORMAL LOW (ref >60)
Glucose, Bld: 101 mg/dL — ABNORMAL HIGH (ref 70–99)
Potassium: 3.9 mmol/L (ref 3.5–5.1)
Sodium: 136 mmol/L (ref 135–145)
Total Bilirubin: 1.2 mg/dL (ref 0.3–1.2)
Total Protein: 5.7 g/dL — ABNORMAL LOW (ref 6.5–8.1)

## 2021-12-15 LAB — TSH: TSH: 2.869 u[IU]/mL (ref 0.350–4.500)

## 2021-12-15 LAB — CK: Total CK: 2716 U/L — ABNORMAL HIGH (ref 49–397)

## 2021-12-15 NOTE — Progress Notes (Signed)
Occupational Therapy Treatment Patient Details Name: Luis Strong MRN: [id number] DOB: 21-Dec-1945 Today's Date: 12/15/2021   History of present illness Luis Strong is a 76 y.o. male with medical history significant for CKD 3B, HTN, CAD, CVA, OSA on CPAP, history of GI bleed 2020 with nonbleeding esophageal ulcer on EGD, nephrolithiasis s/p ureteroscopy with bilateral J stent placement on 1/17, who presents to the ED with generalized malaise and fever starting 2 days after his procedure.  He denies abdominal pain, nausea or vomiting and denies diarrhea.  Has no cough, shortness of breath or chest pain.   OT comments  Upon  entering the room, pt seated in recliner chair with no c/o pain and agreeable to OT intervention. Pt ambulates 50' RW and min A for balance while on RA and O2 saturation remains at 96%.   Pt given [employer] cognitive screen this session. Pt reports having higher level of education but unable to report what degrees he has obtained. Pt is oriented to time and location during session. Pt scores 12/30 which places him in "dementia" category. This assessment asks questions related to reading comprehension, attention, problem solving, and sequencing. Pt with increased difficulty and no real insight about that fact that he was not doing well during assessment. This score leads me to believe that pt would need 24/7 assistance at discharge for safety. He would likely need assistance with all IADL tasks and most importantly with medication management. Pt is very cooperative during session and requests to return to bed with min A. All needs within reach and bed alarm activated for safety. Pt continues to benefit from OT intervention.    Recommendations for follow up therapy are one component of a multi-disciplinary discharge planning process, led by the attending physician.  Recommendations may be updated based on patient status, additional functional criteria and insurance  authorization.    Follow Up Recommendations  Skilled nursing-short term rehab (<3 hours/day)    Assistance Recommended at Discharge Frequent or constant Supervision/Assistance  Patient can return home with the following  A little help with walking and/or transfers;A little help with bathing/dressing/bathroom;Direct supervision/assist for medications management;Direct supervision/assist for financial management;Assist for transportation;Help with stairs or ramp for entrance;Assistance with cooking/housework   Equipment Recommendations  Other (comment) (defer to next venue of care)       Precautions / Restrictions Precautions Precautions: Fall Restrictions Weight Bearing Restrictions: No       Mobility Bed Mobility Overal bed mobility: Needs Assistance Bed Mobility: Sit to Supine       Sit to supine: Min assist, Min guard        Transfers Overall transfer level: Needs assistance Equipment used: Rolling walker (2 wheels), None Transfers: Sit to/from Stand Sit to Stand: Min guard                 Balance Overall balance assessment: Needs assistance Sitting-balance support: Feet supported Sitting balance-Leahy Scale: Good     Standing balance support: Bilateral upper extremity supported, During functional activity, Reliant on assistive device for balance Standing balance-Leahy Scale: Poor Standing balance comment: severe balance/safety deficits with all standing activity                           ADL either performed or assessed with clinical judgement    Extremity/Trunk Assessment Upper Extremity Assessment Upper Extremity Assessment: Overall WFL for tasks assessed   Lower Extremity Assessment Lower Extremity Assessment: Overall WFL for tasks assessed  Vision Patient Visual Report: No change from baseline            Cognition Arousal/Alertness: Awake/alert Behavior During Therapy: WFL for tasks assessed/performed Overall Cognitive  Status: Impaired/Different from baseline Area of Impairment: Awareness, Safety/judgement, Problem solving, Memory                     Memory: Decreased short-term memory   Safety/Judgement: Decreased awareness of safety, Decreased awareness of deficits Awareness: Intellectual Problem Solving: Requires verbal cues, Requires tactile cues General Comments: Pt is pleasant and cooperative.Oriented x 3.                   Pertinent Vitals/ Pain       Pain Assessment Pain Assessment: No/denies pain         Frequency  Min 3X/week        Progress Toward Goals  OT Goals(current goals can now be found in the care plan section)  Progress towards OT goals: Progressing toward goals  Acute Rehab OT Goals Patient Stated Goal: to go home and be independent OT Goal Formulation: With patient Time For Goal Achievement: 12/26/21 Potential to Achieve Goals: Good  Plan Frequency remains appropriate;Discharge plan remains appropriate    Co-evaluation        PT goals addressed during session: Mobility/safety with mobility;Balance;Proper use of DME;Strengthening/ROM        AM-PAC OT "6 Clicks" Daily Activity     Outcome Measure   Help from another person eating meals?: None Help from another person taking care of personal grooming?: A Little Help from another person toileting, which includes using toliet, bedpan, or urinal?: A Lot Help from another person bathing (including washing, rinsing, drying)?: A Lot Help from another person to put on and taking off regular upper body clothing?: A Little Help from another person to put on and taking off regular lower body clothing?: A Lot 6 Click Score: 16    End of Session Equipment Utilized During Treatment: Rolling walker (2 wheels)  OT Visit Diagnosis: Unsteadiness on feet (R26.81);History of falling (Z91.81)   Activity Tolerance Patient tolerated treatment well   Patient Left with call bell/phone within reach;in  bed;with bed alarm set   Nurse Communication Mobility status        Time: 1610-9604 OT Time Calculation (min): 29 min  Charges: OT General Charges $OT Visit: 1 Visit OT Treatments $Therapeutic Activity: 23-37 mins  Darleen Crocker, MS, OTR/L , CBIS ascom [phone]  12/15/21, 2:23 PM

## 2021-12-15 NOTE — Progress Notes (Signed)
Pharmacy Antibiotic Note  Luis Strong is a 76 y.o. male admitted on 12/11/2021 with sepsis secondary to bacteremia. He had cystoscopy with laser lithotripsy and stent placement 1/17  (preop Urine cx on 1/13 was no growth)   1/23 blood cultures: 4/4 GPC, BCID = E faecalis without vancomycin resistance  Today,12/15/2021: - Afeb x48hrs, WBC WNL, No growth to date in repeat Bcx (1/26>> - Renal: AKI on CKD. SCr slowly improving Scr 4.38>3.6 (BL ~1.5) - CK noted to be 31,000>13,461>4567>2716; LFT's improving  Plan: Per d/w ID, initiated ampicillin w/ plan for 14d of therapy.  Continue with renally adjusted ampicillin 2gm IV q8h Monitor renal function as AKI/Rhabdo resolving Follow ID recommendations  Height: 5\' 9"  (175.3 cm) Weight: 96.2 kg (212 lb 1.3 oz) IBW/kg (Calculated) : 70.7  Temp (24hrs), Avg:98.2 F (36.8 C), Min:97.4 F (36.3 C), Max:99.3 F (37.4 C)  Recent Labs  Lab 12/11/21 0045 12/11/21 2113 12/11/21 2114 12/12/21 0719 12/13/21 0629 12/14/21 0450 12/15/21 0541  WBC  --   --  13.8* 11.6* 9.1 10.2 9.9  CREATININE  --   --  3.64* 4.16* 4.38* 4.17* 3.60*  LATICACIDVEN 2.0* 2.0*  --  1.8  --   --   --      Estimated Creatinine Clearance: 20 mL/min (A) (by C-G formula based on SCr of 3.6 mg/dL (H)).    Allergies  Allergen Reactions   Capsaicin Itching and Rash    Severe rash and itching.    Antimicrobials this admission: 1/23 Cefepime >> 1/23 1/24 ampicillin >>  Microbiology results: 1/26 Bcx: NGTD 1/23 Bcx: 4/4 E faecalis - (Pan-S) 1/23 Ucx: E Faecalis - (Pan-S)  Thank you for allowing pharmacy to be a part of this patients care.  Lorna Dibble, PharmD, [facility] Clinical Pharmacist 12/15/2021 9:05 AM

## 2021-12-15 NOTE — Progress Notes (Signed)
Pharmacy Antibiotic Note  Luis Strong is a 76 y.o. male admitted on 12/11/2021 with sepsis secondary to bacteremia. He had cystoscopy with laser lithotripsy and stent placement 1/17  (preop Urine cx on 1/13 was no growth)   1/23 blood cultures: 4/4 GPC, BCID = E faecalis without vancomycin resistance  Today,12/15/2021 - renal: AKI on CKD d/t rhabdomyolysis. SCr improving - CK noted to be 31,000 at admission - improving - LFTs improving - repeat Bcx NGTD from 1/26  Plan: Continue ampicillin 2gm IV q8h as CrCl currently 63ml/min Monitor renal function - adjust dose once CrCl > 30 ml/min Follow ID recommendations  Height: 5\' 9"  (175.3 cm) Weight: 96.2 kg (212 lb 1.3 oz) IBW/kg (Calculated) : 70.7  Temp (24hrs), Avg:98.3 F (36.8 C), Min:97.4 F (36.3 C), Max:99.3 F (37.4 C)  Recent Labs  Lab 12/11/21 0045 12/11/21 2113 12/11/21 2114 12/12/21 0719 12/13/21 0629 12/14/21 0450 12/15/21 0541  WBC  --   --  13.8* 11.6* 9.1 10.2 9.9  CREATININE  --   --  3.64* 4.16* 4.38* 4.17* 3.60*  LATICACIDVEN 2.0* 2.0*  --  1.8  --   --   --      Estimated Creatinine Clearance: 20 mL/min (A) (by C-G formula based on SCr of 3.6 mg/dL (H)).    Allergies  Allergen Reactions   Capsaicin Itching and Rash    Severe rash and itching.    Antimicrobials this admission: 1/23 Cefepime >> 1/23 1/24 ampicillin >>  Microbiology results: 1/23 BCx: 4/4 GPC, E faecalis 1/23 UCx: E faecalis 1/26 BcxL NGTD  Thank you for allowing pharmacy to be a part of this patients care.  Doreene Eland, PharmD, BCPS, BCIDP Work Cell: [phone] 12/15/2021 11:42 AM

## 2021-12-15 NOTE — Progress Notes (Signed)
° °  Date of Admission:  12/11/2021     ID: Luis Strong is a 76 y.o. male  Principal Problem:   Severe sepsis ([place]) Active Problems:   HTN (hypertension)   OSA on CPAP   Complicated UTI (urinary tract infection)   Abnormal LFTs   Acute kidney injury superimposed on CKD lllb ([facility])   History of GI bleed   History of peptic ulcer   S/P cystoscopy with ureteral stent placement   High anion gap metabolic acidosis   Subjective: Pt feeling better No pain No fever Appetite good  Medications:   aspirin EC  81 mg Oral Daily   citalopram  40 mg Oral Daily   darifenacin  7.5 mg Oral Daily   diltiazem  180 mg Oral Daily   metoprolol tartrate  25 mg Oral BID   multivitamin with minerals  1 tablet Oral Daily   pantoprazole  40 mg Oral Daily    Objective: Vital signs in last 24 hours: Temp:  [97.4 F (36.3 C)-99.3 F (37.4 C)] 98.2 F (36.8 C) (01/27 1623) Pulse Rate:  [79-88] 81 (01/27 1623) Resp:  [16-25] 20 (01/27 1623) BP: (139-158)/(55-81) 149/77 (01/27 1623) SpO2:  [94 %-100 %] 98 % (01/27 1623) Weight:  [96.2 kg] 96.2 kg (01/27 0500)  PHYSICAL EXAM:  General: Alert, cooperative, no distress,  Lungs: Bilateral air entry.  Heart: Regular rate and rhythm, no murmur, rub or gallop. Abdomen: Soft, non-tender,not distended. Bowel sounds normal. No masses Extremities: bruising left knee Skin: No rashes or lesions. Or bruising Lymph: Cervical, supraclavicular normal. Neurologic: Grossly non-focal  Lab Results Recent Labs    12/14/21 0450 12/15/21 0541  WBC 10.2 9.9  HGB 13.5 13.8  HCT 39.4 41.9  NA 134* 136  K 4.0 3.9  CL 107 108  CO2 19* 20*  BUN 85* 77*  CREATININE 4.17* 3.60*   Liver Panel Recent Labs    12/14/21 0450 12/15/21 0541  PROT 5.6* 5.7*  ALBUMIN 2.5* 2.6*  AST 262* 166*  ALT 192* 168*  ALKPHOS 42 45  BILITOT 1.0 1.2  Microbiology: 1/23 BC/UC- enterococcus fecalis 12/14/2021 repeat blood culture sent Studies/Results: No results  found.   Assessment/Plan: Enterococcus bacteremia with sepsis secondary to complictaed UTI, following laser lithotripsy, stent placement rt side Pt is on IV ampicillin- will need for a total of 14 days  Patient does not have any heart device or valve replacement and hence endocarditis is less likely No joint replacements  AKI on CKD- multifactorial- sepsis/rhabdomyolysis /pre renal improving    Severe rhabdomyolysis- due to fall and being on the floor --contributing to AKI- management as per primary team.  Is improving  transaminitis due to sepsis/rhabdo Improving Discussed the management with the patient  and hospitalist ID will follow him peripherally this weekend- call if needed

## 2021-12-15 NOTE — Progress Notes (Signed)
Physical Therapy Treatment Patient Details Name: Luis Strong MRN: [id number] DOB: 1946-10-09 Today's Date: 12/15/2021   History of Present Illness Luis Strong is a 76 y.o. male with medical history significant for CKD 3B, HTN, CAD, CVA, OSA on CPAP, history of GI bleed 2020 with nonbleeding esophageal ulcer on EGD, nephrolithiasis s/p ureteroscopy with bilateral J stent placement on 1/17, who presents to the ED with generalized malaise and fever starting 2 days after his procedure.  He denies abdominal pain, nausea or vomiting and denies diarrhea.  Has no cough, shortness of breath or chest pain.    PT Comments    Pt was long sitting in bed upon arriving. He is alert and oriented however does have cognition concerns come to light throughout session. Overall poor insight of safety/deficits. Supportive son was present throughout and reports that pt is definitely not at baseline cognition. Pt lives with son however son still working full time as Software engineer at [employer] Pt is cooperative and pleasant throughout. Follows simple one step commands consistently however struggles with multi-step task. He was on 2 L o2 upon arriving however was able to be weaned off o2. Sao2 on rm air was >94% with HR stable even with ambulation. RN staff aware pt was left off O2 post session. He was able to exit bed with assistance, stand to RW and ambulate ~ 50 ft. Slight fatigue noted with minimal distance however recovers quickly with seated rest. Pt also ambulated a short distance without AD with constant assistance required to prevent LOB. Pt is not at baseline physically or cognitively. Author recommend sDC to SNF to address these deficits while assisting pt to PLOF. Acute PT will continue to follow and progress pt as able per current POC.    Recommendations for follow up therapy are one component of a multi-disciplinary discharge planning process, led by the attending physician.  Recommendations may be updated  based on patient status, additional functional criteria and insurance authorization.  Follow Up Recommendations  Skilled nursing-short term rehab (<3 hours/day)     Assistance Recommended at Discharge Frequent or constant Supervision/Assistance  Patient can return home with the following A little help with bathing/dressing/bathroom;Assistance with cooking/housework;Assist for transportation;Help with stairs or ramp for entrance;A lot of help with walking and/or transfers   Equipment Recommendations  Rolling walker (2 wheels)       Precautions / Restrictions Precautions Precautions: Fall Restrictions Weight Bearing Restrictions: No     Mobility  Bed Mobility Overal bed mobility: Needs Assistance Bed Mobility: Supine to Sit     Supine to sit: Min assist     General bed mobility comments: HOB was elevated + min assist to safely achieve EOB short sit. pt has slow motor planning. cognition does limit session throughout    Transfers Overall transfer level: Needs assistance Equipment used: Rolling walker (2 wheels), None Transfers: Sit to/from Stand Sit to Stand: Min guard           General transfer comment: Pt was able to stand with and without RW with CGA. he requires vcs for handplacement and improved overall technique. pt is much safer with use of rW versus without. does not use AD at baseline however will need AD at DC for safety with all OOB activity until balance/strength returns to PLOF    Ambulation/Gait Ambulation/Gait assistance: Min guard, Min assist Gait Distance (Feet): 50 Feet Assistive device: Rolling walker (2 wheels), None Gait Pattern/deviations: Step-through pattern, Decreased step length - right, Decreased step length -  left Gait velocity: decreased     General Gait Details: pt was able to ambulate with RW and even a little without AD. pt is unsteady and not at baseline. He is very [place] without use of RW.     Balance Overall balance  assessment: Needs assistance Sitting-balance support: Feet supported Sitting balance-Leahy Scale: Good     Standing balance support: Bilateral upper extremity supported, During functional activity, Reliant on assistive device for balance Standing balance-Leahy Scale: Poor Standing balance comment: severe balance/safety deficits with all standing activity        Cognition Arousal/Alertness: Awake/alert Behavior During Therapy: WFL for tasks assessed/performed Overall Cognitive Status: Impaired/Different from baseline Area of Impairment: Awareness, Safety/judgement, Problem solving        Safety/Judgement: Decreased awareness of safety, Decreased awareness of deficits Awareness: Intellectual Problem Solving: Requires verbal cues, Requires tactile cues General Comments: Pt alert and oriented to self, place, and month/year. Disoriented to situation. Son states that his memory/cognition is different from baseline. Pt will not have 24/7 assistance/supervision at DC. lives with son who still works full time               Pertinent Vitals/Pain Pain Assessment Pain Assessment: No/denies pain     PT Goals (current goals can now be found in the care plan section) Acute Rehab PT Goals Patient Stated Goal: go home Progress towards PT goals: Progressing toward goals    Frequency    Min 2X/week      PT Plan Current plan remains appropriate    Co-evaluation     PT goals addressed during session: Mobility/safety with mobility;Balance;Proper use of DME;Strengthening/ROM        AM-PAC PT "6 Clicks" Mobility   Outcome Measure  Help needed turning from your back to your side while in a flat bed without using bedrails?: A Lot Help needed moving from lying on your back to sitting on the side of a flat bed without using bedrails?: A Lot Help needed moving to and from a bed to a chair (including a wheelchair)?: A Little Help needed standing up from a chair using your arms (e.g.,  wheelchair or bedside chair)?: A Little Help needed to walk in hospital room?: A Lot Help needed climbing 3-5 steps with a railing? : A Lot 6 Click Score: 14    End of Session Equipment Utilized During Treatment: Gait belt Activity Tolerance: Patient tolerated treatment well;Patient limited by fatigue Patient left: in chair;with call bell/phone within reach;with chair alarm set Nurse Communication: Mobility status PT Visit Diagnosis: Unsteadiness on feet (R26.81);Other abnormalities of gait and mobility (R26.89);Muscle weakness (generalized) (M62.81);Difficulty in walking, not elsewhere classified (R26.2);History of falling (Z91.81)     Time: 0626-9485 PT Time Calculation (min) (ACUTE ONLY): 25 min  Charges:  $Gait Training: 8-22 mins $Therapeutic Activity: 8-22 mins                     Julaine Fusi PTA 12/15/21, 11:09 AM

## 2021-12-15 NOTE — Care Management Important Message (Signed)
Important Message  Patient Details  Name: BARTLEY VUOLO MRN: [id number] Date of Birth: [birth year]   Medicare Important Message Given:  Yes     Dannette Barbara 12/15/2021, 1:02 PM

## 2021-12-15 NOTE — Progress Notes (Signed)
PROGRESS NOTE    Luis Strong  VFI:[id number] DOB: May 12, 1946 DOA: 12/11/2021 PCP: Juline Patch, MD    Brief Narrative:  76 year old M with PMH of CKD-3B, CAD, CVA, OSA on CPAP, HTN, GI bleed/esophageal ulcer, and nephrolithiasis s/p bilateral J stent on 12/05/2021 presenting with general malaise and fever, and admitted for severe sepsis due to complicated UTI.  Patient's son found him down on the floor and called EMS. CT renal stone study negative for hydronephrosis or obstruction, and the stents at right place.  Cultures obtained.  Patient was started on IV cefepime.  Urology consulted.   Blood culture with GPC.  Speciation Enterococcus faecalis.  Infectious disease involved.  Patient on IV ampicillin.  Clinically improving.   Assessment & Plan:   Principal Problem:   Severe sepsis ([place]) Active Problems:   HTN (hypertension)   OSA on CPAP   Complicated UTI (urinary tract infection)   Abnormal LFTs   Acute kidney injury superimposed on CKD lllb ([facility])   History of GI bleed   History of peptic ulcer   S/P cystoscopy with ureteral stent placement   High anion gap metabolic acidosis  - Severe sepsis due to bacteremia and complicated UTI and patient with nephrolithiasis s/p bilateral JJ stent on 12/05/2020 -Enterococcus faecalis bacteremia -Elevated liver function test secondary to sepsis  POA.  Had fever, tachycardia, tachypnea, AKI and lactic acidosis.  Culture data as above.  Lactic acidosis resolved.  Pro-Cal elevated to 7.92.  Leukocytosis improving. Kidney function improving Liver function improving Plan: Continue maintenance fluids, normal saline at 125 cc/h On IV ampicillin per ID recommendations, will need 14-day course Monitor vitals and fever curve  Atrial fibrillation with rapid ventricular response New onset Required Cardizem gtt., now weaned off HR controlled Plan: Continue cardizem CD 180 daily Continue home metoprolol Defer anticoagulation for now as  likely A. fib was driven by underlying severe sepsis   AKI on CKD-3B  Likely from rhabdomyolysis and sepsis.   CT renal stone study without hydronephrosis or obstruction Creatinine improving, still not at baseline Plan: Continue aggressive IV fluids Monitor UOP Daily renal function   Anion gap metabolic acidosis  Likely due to renal failure.  Improved. -Continue monitoring   Possible fall at home Rhabdomyolysis CK elevated to 31,000.  Could be traumatic.   He was found down by his patient's son He is also active, working out 6 times weekly CK down to 2700 on 1/27 Plan: Aggressive IVF Hold home statin Therapy evaluations Current recommendation for skilled nursing facility    Elevated liver enzymes/hyperbilirubinemia  Likely due to rhabdomyolysis.  RUQ [place] without acute finding.   Patient is s/p cholecystectomy.  No GI symptoms.  Improving. -IV fluids as above -Daily CMP   Essential hypertension: Normotensive for most part. -Continue home metoprolol   OSA on CPAP -Continue nightly CPAP   History of GI bleed/esophageal ulcer -Continue home Protonix   Thrombocytopenia  Acute on chronic.  Likely due to sepsis. -Discontinue subcu Lovenox -SCD for VTE prophylaxis   DVT prophylaxis: SCD Code Status: Full Family Communication: Son at bedside 1/27 Disposition Plan: Status is: Inpatient  Remains inpatient appropriate because: Severe sepsis, AKI, rhabdomyolysis secondary to enterococcal bacteremia and fall with time down.  Disposition plan pending.   Level of care: Progressive  Consultants:  ID  Procedures:  None  Antimicrobials: Ampicillin   Subjective: Patient seen and examined.  Sitting up in bed.  Endorses symptomatic improvement.  Weakness improving  Objective: Vitals:   12/15/21  0434 12/15/21 0500 12/15/21 0824 12/15/21 1120  BP: (!) 149/76  (!) 150/81 (!) 158/78  Pulse: 79  85 82  Resp: (!) 24  17 16   Temp: 98.6 F (37 C)  (!) 97.4 F (36.3 C)  98.3 F (36.8 C)  TempSrc:    Oral  SpO2: 97%  98% 100%  Weight:  96.2 kg    Height:        Intake/Output Summary (Last 24 hours) at 12/15/2021 1226 Last data filed at 12/14/2021 2235 Gross per 24 hour  Intake --  Output 800 ml  Net -800 ml   Filed Weights   12/13/21 0441 12/14/21 0500 12/15/21 0500  Weight: 98.2 kg 96.5 kg 96.2 kg    Examination:  General exam: No acute distress Respiratory system: Lungs clear.  Normal work of breathing.  2 L Cardiovascular system: S1-S2, RRR, no murmurs, no pedal edema Gastrointestinal system: Soft, NT/ND, normal bowel sounds Central nervous system: Alert and oriented. No focal neurological deficits. Extremities: Diffusely weak bilaterally Skin: No rashes, lesions or ulcers Psychiatry: Judgement and insight appear normal. Mood & affect appropriate.     Data Reviewed: I have personally reviewed following labs and imaging studies  CBC: Recent Labs  Lab 12/11/21 2114 12/12/21 0719 12/13/21 0629 12/14/21 0450 12/15/21 0541  WBC 13.8* 11.6* 9.1 10.2 9.9  NEUTROABS 12.5*  --   --  8.6* 8.2*  HGB 16.7 14.5 14.2 13.5 13.8  HCT 49.8 43.7 43.3 39.4 41.9  MCV 84.4 85.9 86.4 84.2 87.1  PLT 91* 88* 84* 96* 700*   Basic Metabolic Panel: Recent Labs  Lab 12/11/21 2114 12/12/21 0719 12/13/21 0629 12/14/21 0450 12/15/21 0541  NA 132* 135 133* 134* 136  K 3.9 4.7 4.1 4.0 3.9  CL 99 102 104 107 108  CO2 17* 19* 18* 19* 20*  GLUCOSE 153* 130* 109* 115* 101*  BUN 55* 65* 75* 85* 77*  CREATININE 3.64* 4.16* 4.38* 4.17* 3.60*  CALCIUM 8.9 8.4* 7.7* 7.8* 7.7*  MG  --  2.4 2.5*  --   --   PHOS  --   --  4.5  --   --    GFR: Estimated Creatinine Clearance: 20 mL/min (A) (by C-G formula based on SCr of 3.6 mg/dL (H)). Liver Function Tests: Recent Labs  Lab 12/11/21 2114 12/12/21 0719 12/13/21 0629 12/14/21 0450 12/15/21 0541  AST 815* 618* 390* 262* 166*  ALT 230* 207* 200* 192* 168*  ALKPHOS 55 41 36* 42 45  BILITOT 2.0* 1.3*  1.4* 1.0 1.2  PROT 7.3 6.4* 6.0* 5.6* 5.7*  ALBUMIN 3.8 3.2* 2.9* 2.5* 2.6*   No results for input(s): LIPASE, AMYLASE in the last 168 hours. Recent Labs  Lab 12/12/21 1655  AMMONIA 28   Coagulation Profile: Recent Labs  Lab 12/11/21 2114 12/12/21 0719  INR 1.2 1.2   Cardiac Enzymes: Recent Labs  Lab 12/12/21 0719 12/13/21 0629 12/14/21 0450 12/15/21 0541  CKTOTAL 31,396* 13,461* 4,567* 2,716*   BNP (last 3 results) No results for input(s): PROBNP in the last 8760 hours. HbA1C: No results for input(s): HGBA1C in the last 72 hours. CBG: No results for input(s): GLUCAP in the last 168 hours. Lipid Profile: No results for input(s): CHOL, HDL, LDLCALC, TRIG, CHOLHDL, LDLDIRECT in the last 72 hours. Thyroid Function Tests: Recent Labs    12/15/21 0541  TSH 2.869   Anemia Panel: No results for input(s): VITAMINB12, FOLATE, FERRITIN, TIBC, IRON, RETICCTPCT in the last 72 hours. Sepsis Labs: Recent  Labs  Lab 12/11/21 0045 12/11/21 2113 12/12/21 0719  PROCALCITON  --   --  7.92  LATICACIDVEN 2.0* 2.0* 1.8    Recent Results (from the past 240 hour(s))  Culture, blood (Routine x 2)     Status: Abnormal   Collection Time: 12/11/21  9:13 PM   Specimen: BLOOD  Result Value Ref Range Status   Specimen Description   Final    BLOOD LEFT FOREARM Performed at [facility], [address], [place], [place] [address]    Special Requests   Final    BOTTLES DRAWN AEROBIC AND ANAEROBIC Blood Culture adequate volume Performed at [facility], [address] [place], [place] [address]    Culture  Setup Time   Final    GRAM POSITIVE COCCI IN BOTH AEROBIC AND ANAEROBIC BOTTLES CRITICAL RESULT CALLED TO, READ BACK BY AND VERIFIED WITH: MORGAN HICKS 12/12/21 1205 MW    Culture (A)  Final    ENTEROCOCCUS FAECALIS SUSCEPTIBILITIES PERFORMED ON PREVIOUS CULTURE WITHIN THE LAST 5 DAYS. Performed at [facility], [place] [address] [place], [place]  [address]    Report Status 12/14/2021 FINAL  Final  Culture, blood (Routine x 2)     Status: Abnormal   Collection Time: 12/11/21  9:13 PM   Specimen: BLOOD  Result Value Ref Range Status   Specimen Description   Final    BLOOD RW Performed at [facility], [address], [place], [place] [address]    Special Requests   Final    BOTTLES DRAWN AEROBIC AND ANAEROBIC BCAV Performed at [facility], [address] [place], [place] [address]    Culture  Setup Time   Final    GRAM POSITIVE COCCI IN BOTH AEROBIC AND ANAEROBIC BOTTLES CRITICAL RESULT CALLED TO, READ BACK BY AND VERIFIED WITH: MORGAN HICKS 12/12/21 1205 MW    Culture ENTEROCOCCUS FAECALIS (A)  Final   Report Status 12/14/2021 FINAL  Final   Organism ID, Bacteria ENTEROCOCCUS FAECALIS  Final      Susceptibility   Enterococcus faecalis - MIC*    AMPICILLIN <=2 SENSITIVE Sensitive     VANCOMYCIN 1 SENSITIVE Sensitive     GENTAMICIN SYNERGY SENSITIVE Sensitive     * ENTEROCOCCUS FAECALIS  Blood Culture ID Panel (Reflexed)     Status: Abnormal   Collection Time: 12/11/21  9:13 PM  Result Value Ref Range Status   Enterococcus faecalis DETECTED (A) NOT DETECTED Final    Comment: CRITICAL RESULT CALLED TO, READ BACK BY AND VERIFIED WITH: MORGAN HICKS 12/12/21 1206 MW    Enterococcus Faecium NOT DETECTED NOT DETECTED Final   Listeria monocytogenes NOT DETECTED NOT DETECTED Final   Staphylococcus species NOT DETECTED NOT DETECTED Final   Staphylococcus aureus (BCID) NOT DETECTED NOT DETECTED Final   Staphylococcus epidermidis NOT DETECTED NOT DETECTED Final   Staphylococcus lugdunensis NOT DETECTED NOT DETECTED Final   Streptococcus species NOT DETECTED NOT DETECTED Final   Streptococcus agalactiae NOT DETECTED NOT DETECTED Final   Streptococcus pneumoniae NOT DETECTED NOT DETECTED Final   Streptococcus pyogenes NOT DETECTED NOT DETECTED Final   A.calcoaceticus-baumannii NOT DETECTED NOT DETECTED Final    Bacteroides fragilis NOT DETECTED NOT DETECTED Final   Enterobacterales NOT DETECTED NOT DETECTED Final   Enterobacter cloacae complex NOT DETECTED NOT DETECTED Final   Escherichia coli NOT DETECTED NOT DETECTED Final   Klebsiella aerogenes NOT DETECTED NOT DETECTED Final   Klebsiella oxytoca NOT DETECTED NOT DETECTED Final  Klebsiella pneumoniae NOT DETECTED NOT DETECTED Final   Proteus species NOT DETECTED NOT DETECTED Final   Salmonella species NOT DETECTED NOT DETECTED Final   Serratia marcescens NOT DETECTED NOT DETECTED Final   Haemophilus influenzae NOT DETECTED NOT DETECTED Final   Neisseria meningitidis NOT DETECTED NOT DETECTED Final   Pseudomonas aeruginosa NOT DETECTED NOT DETECTED Final   Stenotrophomonas maltophilia NOT DETECTED NOT DETECTED Final   Candida albicans NOT DETECTED NOT DETECTED Final   Candida auris NOT DETECTED NOT DETECTED Final   Candida glabrata NOT DETECTED NOT DETECTED Final   Candida krusei NOT DETECTED NOT DETECTED Final   Candida parapsilosis NOT DETECTED NOT DETECTED Final   Candida tropicalis NOT DETECTED NOT DETECTED Final   Cryptococcus neoformans/gattii NOT DETECTED NOT DETECTED Final   Vancomycin resistance NOT DETECTED NOT DETECTED Final    Comment: Performed at [facility], [address] [place], [place] [address]  Resp Panel by RT-PCR (Flu A&B, Covid) Nasopharyngeal Swab     Status: None   Collection Time: 12/11/21  9:14 PM   Specimen: Nasopharyngeal Swab; Nasopharyngeal(NP) swabs in vial transport medium  Result Value Ref Range Status   SARS Coronavirus 2 by RT PCR NEGATIVE NEGATIVE Final    Comment: (NOTE) SARS-CoV-2 target nucleic acids are NOT DETECTED.  The SARS-CoV-2 RNA is generally detectable in upper respiratory specimens during the acute phase of infection. The lowest concentration of SARS-CoV-2 viral copies this assay can detect is 138 copies/mL. A negative result does not preclude SARS-Cov-2 infection and  should not be used as the sole basis for treatment or other patient management decisions. A negative result may occur with  improper specimen collection/handling, submission of specimen other than nasopharyngeal swab, presence of viral mutation(s) within the areas targeted by this assay, and inadequate number of viral copies(<138 copies/mL). A negative result must be combined with clinical observations, patient history, and epidemiological information. The expected result is Negative.  Fact Sheet for Patients:  EntrepreneurPulse.com.au  Fact Sheet for Healthcare Providers:  IncredibleEmployment.be  This test is no t yet approved or cleared by the [place] FDA and  has been authorized for detection and/or diagnosis of SARS-CoV-2 by FDA under an Emergency Use Authorization (EUA). This EUA will remain  in effect (meaning this test can be used) for the duration of the COVID-19 declaration under Section 564(b)(1) of the Act, 21 U.S.C.section 360bbb-3(b)(1), unless the authorization is terminated  or revoked sooner.       Influenza A by PCR NEGATIVE NEGATIVE Final   Influenza B by PCR NEGATIVE NEGATIVE Final    Comment: (NOTE) The Xpert Xpress SARS-CoV-2/FLU/RSV plus assay is intended as an aid in the diagnosis of influenza from Nasopharyngeal swab specimens and should not be used as a sole basis for treatment. Nasal washings and aspirates are unacceptable for Xpert Xpress SARS-CoV-2/FLU/RSV testing.  Fact Sheet for Patients: EntrepreneurPulse.com.au  Fact Sheet for Healthcare Providers: IncredibleEmployment.be  This test is not yet approved or cleared by the [place] FDA and has been authorized for detection and/or diagnosis of SARS-CoV-2 by FDA under an Emergency Use Authorization (EUA). This EUA will remain in effect (meaning this test can be used) for the duration of the COVID-19 declaration  under Section 564(b)(1) of the Act, 21 U.S.C. section 360bbb-3(b)(1), unless the authorization is terminated or revoked.  Performed at [facility], [address] [place],  [address]   Urine Culture     Status: Abnormal   Collection Time: 12/11/21 10:27 PM  Specimen: Urine, Random  Result Value Ref Range Status   Specimen Description   Final    URINE, RANDOM Performed at [facility], [address] [place], [place] [address]    Special Requests   Final    NONE Performed at [facility], [address] [place], [place] [address]    Culture >=100,000 COLONIES/mL ENTEROCOCCUS FAECALIS (A)  Final   Report Status 12/14/2021 FINAL  Final   Organism ID, Bacteria ENTEROCOCCUS FAECALIS (A)  Final      Susceptibility   Enterococcus faecalis - MIC*    AMPICILLIN <=2 SENSITIVE Sensitive     NITROFURANTOIN <=16 SENSITIVE Sensitive     VANCOMYCIN 1 SENSITIVE Sensitive     * >=100,000 COLONIES/mL ENTEROCOCCUS FAECALIS  CULTURE, BLOOD (ROUTINE X 2) w Reflex to ID Panel     Status: None (Preliminary result)   Collection Time: 12/14/21  4:50 AM   Specimen: BLOOD  Result Value Ref Range Status   Specimen Description BLOOD BLOOD RIGHT WRIST  Final   Special Requests   Final    BOTTLES DRAWN AEROBIC AND ANAEROBIC Blood Culture adequate volume   Culture   Final    NO GROWTH < 24 HOURS Performed at [facility], [address], [place], [place] [address]    Report Status PENDING  Incomplete  CULTURE, BLOOD (ROUTINE X 2) w Reflex to ID Panel     Status: None (Preliminary result)   Collection Time: 12/14/21  4:50 AM   Specimen: BLOOD  Result Value Ref Range Status   Specimen Description BLOOD BLOOD RIGHT HAND  Final   Special Requests   Final    BOTTLES DRAWN AEROBIC AND ANAEROBIC Blood Culture adequate volume   Culture   Final    NO GROWTH < 24 HOURS Performed at [facility], [address], [place], [place] [address]     Report Status PENDING  Incomplete         Radiology Studies: No results found.      Scheduled Meds:  aspirin EC  81 mg Oral Daily   citalopram  40 mg Oral Daily   darifenacin  7.5 mg Oral Daily   diltiazem  180 mg Oral Daily   metoprolol tartrate  25 mg Oral BID   multivitamin with minerals  1 tablet Oral Daily   pantoprazole  40 mg Oral Daily   Continuous Infusions:  sodium chloride 100 mL/hr at 12/15/21 0927   ampicillin (OMNIPEN) IV 2 g (12/15/21 0604)     LOS: 3 days    Time spent: 35 minutes    Sidney Ace, MD [facility]   If 7PM-7AM, please contact night-coverage  12/15/2021, 12:26 PM

## 2021-12-16 ENCOUNTER — Other Ambulatory Visit: Payer: Self-pay | Admitting: Urology

## 2021-12-16 ENCOUNTER — Inpatient Hospital Stay: Payer: Medicare Other

## 2021-12-16 DIAGNOSIS — R7881 Bacteremia: Secondary | ICD-10-CM | POA: Diagnosis not present

## 2021-12-16 DIAGNOSIS — N390 Urinary tract infection, site not specified: Secondary | ICD-10-CM | POA: Diagnosis not present

## 2021-12-16 DIAGNOSIS — N179 Acute kidney failure, unspecified: Secondary | ICD-10-CM | POA: Diagnosis not present

## 2021-12-16 DIAGNOSIS — T796XXA Traumatic ischemia of muscle, initial encounter: Secondary | ICD-10-CM | POA: Diagnosis not present

## 2021-12-16 LAB — CBC WITH DIFFERENTIAL/PLATELET
Abs Immature Granulocytes: 0.18 10*3/uL — ABNORMAL HIGH (ref 0.00–0.07)
Basophils Absolute: 0 10*3/uL (ref 0.0–0.1)
Basophils Relative: 0 %
Eosinophils Absolute: 0.3 10*3/uL (ref 0.0–0.5)
Eosinophils Relative: 4 %
HCT: 39.3 % (ref 39.0–52.0)
Hemoglobin: 13.2 g/dL (ref 13.0–17.0)
Immature Granulocytes: 2 %
Lymphocytes Relative: 6 %
Lymphs Abs: 0.5 10*3/uL — ABNORMAL LOW (ref 0.7–4.0)
MCH: 28.3 pg (ref 26.0–34.0)
MCHC: 33.6 g/dL (ref 30.0–36.0)
MCV: 84.3 fL (ref 80.0–100.0)
Monocytes Absolute: 0.8 10*3/uL (ref 0.1–1.0)
Monocytes Relative: 9 %
Neutro Abs: 7 10*3/uL (ref 1.7–7.7)
Neutrophils Relative %: 79 %
Platelets: 135 10*3/uL — ABNORMAL LOW (ref 150–400)
RBC: 4.66 MIL/uL (ref 4.22–5.81)
RDW: 14.5 % (ref 11.5–15.5)
WBC: 9 10*3/uL (ref 4.0–10.5)
nRBC: 0 % (ref 0.0–0.2)

## 2021-12-16 LAB — COMPREHENSIVE METABOLIC PANEL
ALT: 133 U/L — ABNORMAL HIGH (ref 0–44)
AST: 111 U/L — ABNORMAL HIGH (ref 15–41)
Albumin: 2.6 g/dL — ABNORMAL LOW (ref 3.5–5.0)
Alkaline Phosphatase: 52 U/L (ref 38–126)
Anion gap: 10 (ref 5–15)
BUN: 66 mg/dL — ABNORMAL HIGH (ref 8–23)
CO2: 16 mmol/L — ABNORMAL LOW (ref 22–32)
Calcium: 7.9 mg/dL — ABNORMAL LOW (ref 8.9–10.3)
Chloride: 110 mmol/L (ref 98–111)
Creatinine, Ser: 3.14 mg/dL — ABNORMAL HIGH (ref 0.61–1.24)
GFR, Estimated: 20 mL/min — ABNORMAL LOW (ref >60)
Glucose, Bld: 100 mg/dL — ABNORMAL HIGH (ref 70–99)
Potassium: 4.1 mmol/L (ref 3.5–5.1)
Sodium: 136 mmol/L (ref 135–145)
Total Bilirubin: 1.2 mg/dL (ref 0.3–1.2)
Total Protein: 5.7 g/dL — ABNORMAL LOW (ref 6.5–8.1)

## 2021-12-16 LAB — CK: Total CK: 2003 U/L — ABNORMAL HIGH (ref 49–397)

## 2021-12-16 MED ORDER — IPRATROPIUM-ALBUTEROL 0.5-2.5 (3) MG/3ML IN SOLN: 3.0000 mL | RESPIRATORY_TRACT | Status: DC | PRN

## 2021-12-16 MED ORDER — LACTATED RINGERS IV SOLN: INTRAVENOUS | Status: DC

## 2021-12-16 NOTE — Progress Notes (Signed)
PROGRESS NOTE    WE[place] W[place]GARDEN  DGL:[id number] DOB: Sep 26, 1946 DOA: 12/11/2021 PCP: Juline Patch, MD    Brief Narrative:  76 year old M with PMH of CKD-3B, CAD, CVA, OSA on CPAP, HTN, GI bleed/esophageal ulcer, and nephrolithiasis s/p bilateral J stent on 12/05/2021 presenting with general malaise and fever, and admitted for severe sepsis due to complicated UTI.  Patient's son found him down on the floor and called EMS. CT renal stone study negative for hydronephrosis or obstruction, and the stents at right place.  Cultures obtained.  Patient was started on IV cefepime.  Urology consulted.   Blood culture with GPC.  Speciation Enterococcus faecalis.  Infectious disease involved.  Patient on IV ampicillin.  Clinically improving.   Assessment & Plan:   Principal Problem:   Severe sepsis ([place]) Active Problems:   HTN (hypertension)   OSA on CPAP   Complicated UTI (urinary tract infection)   Abnormal LFTs   Acute kidney injury superimposed on CKD lllb (HCC)   History of GI bleed   History of peptic ulcer   S/P cystoscopy with ureteral stent placement   High anion gap metabolic acidosis  - Severe sepsis due to bacteremia and complicated UTI and patient with nephrolithiasis s/p bilateral JJ stent on 12/05/2020 -Enterococcus faecalis bacteremia -Elevated liver function test secondary to sepsis  POA.  Had fever, tachycardia, tachypnea, AKI and lactic acidosis.  Culture data as above.  Lactic acidosis resolved.  Pro-Cal elevated to 7.92.  Leukocytosis improving. Kidney function improving Liver function improving Plan: Monitor for 24 hours off intravenous fluids On IV ampicillin per ID recommendations, will need 14-day course Per ID may be able to transition to oral antibiotic, will need reassessment Monitor vitals and fever curve  Atrial fibrillation with rapid ventricular response New onset Required Cardizem gtt., now weaned off HR controlled Plan: Continue cardizem CD 180  daily Continue home metoprolol Defer anticoagulation for now as likely A. fib was driven by underlying severe sepsis   AKI on CKD-3B  Likely from rhabdomyolysis and sepsis.   CT renal stone study without hydronephrosis or obstruction Creatinine improving, still not at baseline Plan: Off IV fluids for 24 hours Monitor UOP Daily renal function   Anion gap metabolic acidosis  Likely due to renal failure.  Improved. -Continue monitoring   Possible fall at home Rhabdomyolysis CK elevated to 31,000.  Could be traumatic.   He was found down by his patient's son He is also active, working out 6 times weekly CK down to 2700 on 1/27, continues to downtrend hold IV fluids for next 24 hours Plan: Aggressive IVF Hold home statin Therapy evaluations Current recommendation for skilled nursing facility    Elevated liver enzymes/hyperbilirubinemia  Likely due to rhabdomyolysis.  RUQ [place] without acute finding.   Patient is s/p cholecystectomy.  No GI symptoms.  Improving. -Hold IV fluids for 24 hours -Daily CMP   Essential hypertension: Normotensive for most part. -Continue home metoprolol   OSA on CPAP -Continue nightly CPAP   History of GI bleed/esophageal ulcer -Continue home Protonix   Thrombocytopenia  Acute on chronic.  Likely due to sepsis. -Discontinue subcu Lovenox -SCD for VTE prophylaxis   DVT prophylaxis: SCD Code Status: Full Family Communication: Son at bedside 1/27 Disposition Plan: Status is: Inpatient  Remains inpatient appropriate because: Severe sepsis, AKI, rhabdomyolysis secondary to enterococcal bacteremia and fall with time down.  Disposition plan pending.   Level of care: Progressive  Consultants:  ID  Procedures:  None  Antimicrobials:  Ampicillin   Subjective: Patient seen and examined.  Sitting up in bed.  No visible distress.  Is complaining of some cough and chest congestion.  Objective: Vitals:   12/16/21 0459 12/16/21 0500  12/16/21 0825 12/16/21 1117  BP: (!) 152/78  (!) 148/82 (!) 164/84  Pulse: 84  89 80  Resp: 20  16 16   Temp: 98.5 F (36.9 C)  98.9 F (37.2 C) 98 F (36.7 C)  TempSrc:      SpO2: 96%  97% 95%  Weight:  96.2 kg    Height:        Intake/Output Summary (Last 24 hours) at 12/16/2021 1148 Last data filed at 12/16/2021 0500 Gross per 24 hour  Intake 240 ml  Output 1150 ml  Net -910 ml   Filed Weights   12/14/21 0500 12/15/21 0500 12/16/21 0500  Weight: 96.5 kg 96.2 kg 96.2 kg    Examination:  General exam: No apparent distress Respiratory system: Scattered crackles.  Normal work of breathing.  2 L Cardiovascular system: S1-S2, RRR, no murmurs, no pedal edema Gastrointestinal system: Soft, NT/ND, normal bowel sounds Central nervous system: Alert and oriented. No focal neurological deficits. Extremities: Diffusely weak bilaterally Skin: No rashes, lesions or ulcers Psychiatry: Judgement and insight appear normal. Mood & affect appropriate.     Data Reviewed: I have personally reviewed following labs and imaging studies  CBC: Recent Labs  Lab 12/11/21 2114 12/12/21 0719 12/13/21 0629 12/14/21 0450 12/15/21 0541 12/16/21 0511  WBC 13.8* 11.6* 9.1 10.2 9.9 9.0  NEUTROABS 12.5*  --   --  8.6* 8.2* 7.0  HGB 16.7 14.5 14.2 13.5 13.8 13.2  HCT 49.8 43.7 43.3 39.4 41.9 39.3  MCV 84.4 85.9 86.4 84.2 87.1 84.3  PLT 91* 88* 84* 96* 104* 740*   Basic Metabolic Panel: Recent Labs  Lab 12/12/21 0719 12/13/21 0629 12/14/21 0450 12/15/21 0541 12/16/21 0511  NA 135 133* 134* 136 136  K 4.7 4.1 4.0 3.9 4.1  CL 102 104 107 108 110  CO2 19* 18* 19* 20* 16*  GLUCOSE 130* 109* 115* 101* 100*  BUN 65* 75* 85* 77* 66*  CREATININE 4.16* 4.38* 4.17* 3.60* 3.14*  CALCIUM 8.4* 7.7* 7.8* 7.7* 7.9*  MG 2.4 2.5*  --   --   --   PHOS  --  4.5  --   --   --    GFR: Estimated Creatinine Clearance: 22.9 mL/min (A) (by C-G formula based on SCr of 3.14 mg/dL (H)). Liver Function  Tests: Recent Labs  Lab 12/12/21 0719 12/13/21 0629 12/14/21 0450 12/15/21 0541 12/16/21 0511  AST 618* 390* 262* 166* 111*  ALT 207* 200* 192* 168* 133*  ALKPHOS 41 36* 42 45 52  BILITOT 1.3* 1.4* 1.0 1.2 1.2  PROT 6.4* 6.0* 5.6* 5.7* 5.7*  ALBUMIN 3.2* 2.9* 2.5* 2.6* 2.6*   No results for input(s): LIPASE, AMYLASE in the last 168 hours. Recent Labs  Lab 12/12/21 1655  AMMONIA 28   Coagulation Profile: Recent Labs  Lab 12/11/21 2114 12/12/21 0719  INR 1.2 1.2   Cardiac Enzymes: Recent Labs  Lab 12/12/21 0719 12/13/21 0629 12/14/21 0450 12/15/21 0541 12/16/21 0511  CKTOTAL 31,396* 13,461* 4,567* 2,716* 2,003*   BNP (last 3 results) No results for input(s): PROBNP in the last 8760 hours. HbA1C: No results for input(s): HGBA1C in the last 72 hours. CBG: No results for input(s): GLUCAP in the last 168 hours. Lipid Profile: No results for input(s): CHOL, HDL, LDLCALC, TRIG,  CHOLHDL, LDLDIRECT in the last 72 hours. Thyroid Function Tests: Recent Labs    12/15/21 0541  TSH 2.869   Anemia Panel: No results for input(s): VITAMINB12, FOLATE, FERRITIN, TIBC, IRON, RETICCTPCT in the last 72 hours. Sepsis Labs: Recent Labs  Lab 12/11/21 0045 12/11/21 2113 12/12/21 0719  PROCALCITON  --   --  7.92  LATICACIDVEN 2.0* 2.0* 1.8    Recent Results (from the past 240 hour(s))  Culture, blood (Routine x 2)     Status: Abnormal   Collection Time: 12/11/21  9:13 PM   Specimen: BLOOD  Result Value Ref Range Status   Specimen Description   Final    BLOOD LEFT FOREARM Performed at [facility], [address], [place], [place] [address]    Special Requests   Final    BOTTLES DRAWN AEROBIC AND ANAEROBIC Blood Culture adequate volume Performed at [facility], [address] [place], [place] [address]    Culture  Setup Time   Final    GRAM POSITIVE COCCI IN BOTH AEROBIC AND ANAEROBIC BOTTLES CRITICAL RESULT CALLED TO, READ BACK BY AND VERIFIED  WITH: MORGAN HICKS 12/12/21 1205 MW    Culture (A)  Final    ENTEROCOCCUS FAECALIS SUSCEPTIBILITIES PERFORMED ON PREVIOUS CULTURE WITHIN THE LAST 5 DAYS. Performed at [facility], [place] [address] [place], [place] [address]    Report Status 12/14/2021 FINAL  Final  Culture, blood (Routine x 2)     Status: Abnormal   Collection Time: 12/11/21  9:13 PM   Specimen: BLOOD  Result Value Ref Range Status   Specimen Description   Final    BLOOD RW Performed at [facility], [address], [place], [place] [address]    Special Requests   Final    BOTTLES DRAWN AEROBIC AND ANAEROBIC BCAV Performed at [facility], [address] [place], [place] [address]    Culture  Setup Time   Final    GRAM POSITIVE COCCI IN BOTH AEROBIC AND ANAEROBIC BOTTLES CRITICAL RESULT CALLED TO, READ BACK BY AND VERIFIED WITH: MORGAN HICKS 12/12/21 1205 MW    Culture ENTEROCOCCUS FAECALIS (A)  Final   Report Status 12/14/2021 FINAL  Final   Organism ID, Bacteria ENTEROCOCCUS FAECALIS  Final      Susceptibility   Enterococcus faecalis - MIC*    AMPICILLIN <=2 SENSITIVE Sensitive     VANCOMYCIN 1 SENSITIVE Sensitive     GENTAMICIN SYNERGY SENSITIVE Sensitive     * ENTEROCOCCUS FAECALIS  Blood Culture ID Panel (Reflexed)     Status: Abnormal   Collection Time: 12/11/21  9:13 PM  Result Value Ref Range Status   Enterococcus faecalis DETECTED (A) NOT DETECTED Final    Comment: CRITICAL RESULT CALLED TO, READ BACK BY AND VERIFIED WITH: MORGAN HICKS 12/12/21 1206 MW    Enterococcus Faecium NOT DETECTED NOT DETECTED Final   Listeria monocytogenes NOT DETECTED NOT DETECTED Final   Staphylococcus species NOT DETECTED NOT DETECTED Final   Staphylococcus aureus (BCID) NOT DETECTED NOT DETECTED Final   Staphylococcus epidermidis NOT DETECTED NOT DETECTED Final   Staphylococcus lugdunensis NOT DETECTED NOT DETECTED Final   Streptococcus species NOT DETECTED NOT DETECTED Final    Streptococcus agalactiae NOT DETECTED NOT DETECTED Final   Streptococcus pneumoniae NOT DETECTED NOT DETECTED Final   Streptococcus pyogenes NOT DETECTED NOT DETECTED Final   A.calcoaceticus-baumannii NOT DETECTED NOT DETECTED Final   Bacteroides fragilis NOT DETECTED NOT DETECTED Final   Enterobacterales NOT  DETECTED NOT DETECTED Final   Enterobacter cloacae complex NOT DETECTED NOT DETECTED Final   Escherichia coli NOT DETECTED NOT DETECTED Final   Klebsiella aerogenes NOT DETECTED NOT DETECTED Final   Klebsiella oxytoca NOT DETECTED NOT DETECTED Final   Klebsiella pneumoniae NOT DETECTED NOT DETECTED Final   Proteus species NOT DETECTED NOT DETECTED Final   Salmonella species NOT DETECTED NOT DETECTED Final   Serratia marcescens NOT DETECTED NOT DETECTED Final   Haemophilus influenzae NOT DETECTED NOT DETECTED Final   Neisseria meningitidis NOT DETECTED NOT DETECTED Final   Pseudomonas aeruginosa NOT DETECTED NOT DETECTED Final   Stenotrophomonas maltophilia NOT DETECTED NOT DETECTED Final   Candida albicans NOT DETECTED NOT DETECTED Final   Candida auris NOT DETECTED NOT DETECTED Final   Candida glabrata NOT DETECTED NOT DETECTED Final   Candida krusei NOT DETECTED NOT DETECTED Final   Candida parapsilosis NOT DETECTED NOT DETECTED Final   Candida tropicalis NOT DETECTED NOT DETECTED Final   Cryptococcus neoformans/gattii NOT DETECTED NOT DETECTED Final   Vancomycin resistance NOT DETECTED NOT DETECTED Final    Comment: Performed at [facility], [address] [place], [place] [address]  Resp Panel by RT-PCR (Flu A&B, Covid) Nasopharyngeal Swab     Status: None   Collection Time: 12/11/21  9:14 PM   Specimen: Nasopharyngeal Swab; Nasopharyngeal(NP) swabs in vial transport medium  Result Value Ref Range Status   SARS Coronavirus 2 by RT PCR NEGATIVE NEGATIVE Final    Comment: (NOTE) SARS-CoV-2 target nucleic acids are NOT DETECTED.  The SARS-CoV-2 RNA is generally  detectable in upper respiratory specimens during the acute phase of infection. The lowest concentration of SARS-CoV-2 viral copies this assay can detect is 138 copies/mL. A negative result does not preclude SARS-Cov-2 infection and should not be used as the sole basis for treatment or other patient management decisions. A negative result may occur with  improper specimen collection/handling, submission of specimen other than nasopharyngeal swab, presence of viral mutation(s) within the areas targeted by this assay, and inadequate number of viral copies(<138 copies/mL). A negative result must be combined with clinical observations, patient history, and epidemiological information. The expected result is Negative.  Fact Sheet for Patients:  EntrepreneurPulse.com.au  Fact Sheet for Healthcare Providers:  IncredibleEmployment.be  This test is no t yet approved or cleared by the [place] FDA and  has been authorized for detection and/or diagnosis of SARS-CoV-2 by FDA under an Emergency Use Authorization (EUA). This EUA will remain  in effect (meaning this test can be used) for the duration of the COVID-19 declaration under Section 564(b)(1) of the Act, 21 U.S.C.section 360bbb-3(b)(1), unless the authorization is terminated  or revoked sooner.       Influenza A by PCR NEGATIVE NEGATIVE Final   Influenza B by PCR NEGATIVE NEGATIVE Final    Comment: (NOTE) The Xpert Xpress SARS-CoV-2/FLU/RSV plus assay is intended as an aid in the diagnosis of influenza from Nasopharyngeal swab specimens and should not be used as a sole basis for treatment. Nasal washings and aspirates are unacceptable for Xpert Xpress SARS-CoV-2/FLU/RSV testing.  Fact Sheet for Patients: EntrepreneurPulse.com.au  Fact Sheet for Healthcare Providers: IncredibleEmployment.be  This test is not yet approved or cleared by the [place] FDA  and has been authorized for detection and/or diagnosis of SARS-CoV-2 by FDA under an Emergency Use Authorization (EUA). This EUA will remain in effect (meaning this test can be used) for the duration of the COVID-19 declaration under Section 564(b)(1) of  the Act, 21 U.S.C. section 360bbb-3(b)(1), unless the authorization is terminated or revoked.  Performed at [facility], [address] [place], [place] [address]   Urine Culture     Status: Abnormal   Collection Time: 12/11/21 10:27 PM   Specimen: Urine, Random  Result Value Ref Range Status   Specimen Description   Final    URINE, RANDOM Performed at [facility], [address] [place], [place] [address]    Special Requests   Final    NONE Performed at [facility], [address] [place], [place] [address]    Culture >=100,000 COLONIES/mL ENTEROCOCCUS FAECALIS (A)  Final   Report Status 12/14/2021 FINAL  Final   Organism ID, Bacteria ENTEROCOCCUS FAECALIS (A)  Final      Susceptibility   Enterococcus faecalis - MIC*    AMPICILLIN <=2 SENSITIVE Sensitive     NITROFURANTOIN <=16 SENSITIVE Sensitive     VANCOMYCIN 1 SENSITIVE Sensitive     * >=100,000 COLONIES/mL ENTEROCOCCUS FAECALIS  CULTURE, BLOOD (ROUTINE X 2) w Reflex to ID Panel     Status: None (Preliminary result)   Collection Time: 12/14/21  4:50 AM   Specimen: BLOOD  Result Value Ref Range Status   Specimen Description BLOOD BLOOD RIGHT WRIST  Final   Special Requests   Final    BOTTLES DRAWN AEROBIC AND ANAEROBIC Blood Culture adequate volume   Culture   Final    NO GROWTH 2 DAYS Performed at [facility], [address], [place], [place] [address]    Report Status PENDING  Incomplete  CULTURE, BLOOD (ROUTINE X 2) w Reflex to ID Panel     Status: None (Preliminary result)   Collection Time: 12/14/21  4:50 AM   Specimen: BLOOD  Result Value Ref Range Status   Specimen Description BLOOD BLOOD RIGHT HAND  Final    Special Requests   Final    BOTTLES DRAWN AEROBIC AND ANAEROBIC Blood Culture adequate volume   Culture   Final    NO GROWTH 2 DAYS Performed at [facility], [address] [place], [place] [address]    Report Status PENDING  Incomplete         Radiology Studies: No results found.      Scheduled Meds:  aspirin EC  81 mg Oral Daily   citalopram  40 mg Oral Daily   darifenacin  7.5 mg Oral Daily   diltiazem  180 mg Oral Daily   metoprolol tartrate  25 mg Oral BID   multivitamin with minerals  1 tablet Oral Daily   pantoprazole  40 mg Oral Daily   Continuous Infusions:  ampicillin (OMNIPEN) IV 2 g (12/16/21 0546)     LOS: 4 days    Time spent: 35 minutes    Sidney Ace, MD [facility]   If 7PM-7AM, please contact night-coverage  12/16/2021, 11:48 AM

## 2021-12-17 DIAGNOSIS — N179 Acute kidney failure, unspecified: Secondary | ICD-10-CM | POA: Diagnosis not present

## 2021-12-17 DIAGNOSIS — T796XXA Traumatic ischemia of muscle, initial encounter: Secondary | ICD-10-CM | POA: Diagnosis not present

## 2021-12-17 DIAGNOSIS — R7881 Bacteremia: Secondary | ICD-10-CM | POA: Diagnosis not present

## 2021-12-17 DIAGNOSIS — N390 Urinary tract infection, site not specified: Secondary | ICD-10-CM | POA: Diagnosis not present

## 2021-12-17 LAB — CBC WITH DIFFERENTIAL/PLATELET
Abs Immature Granulocytes: 0.23 10*3/uL — ABNORMAL HIGH (ref 0.00–0.07)
Basophils Absolute: 0.1 10*3/uL (ref 0.0–0.1)
Basophils Relative: 1 %
Eosinophils Absolute: 0.3 10*3/uL (ref 0.0–0.5)
Eosinophils Relative: 3 %
HCT: 40.3 % (ref 39.0–52.0)
Hemoglobin: 13.6 g/dL (ref 13.0–17.0)
Immature Granulocytes: 2 %
Lymphocytes Relative: 8 %
Lymphs Abs: 0.9 10*3/uL (ref 0.7–4.0)
MCH: 28.2 pg (ref 26.0–34.0)
MCHC: 33.7 g/dL (ref 30.0–36.0)
MCV: 83.6 fL (ref 80.0–100.0)
Monocytes Absolute: 1 10*3/uL (ref 0.1–1.0)
Monocytes Relative: 10 %
Neutro Abs: 7.9 10*3/uL — ABNORMAL HIGH (ref 1.7–7.7)
Neutrophils Relative %: 76 %
Platelets: 184 10*3/uL (ref 150–400)
RBC: 4.82 MIL/uL (ref 4.22–5.81)
RDW: 14.5 % (ref 11.5–15.5)
WBC: 10.4 10*3/uL (ref 4.0–10.5)
nRBC: 0 % (ref 0.0–0.2)

## 2021-12-17 LAB — COMPREHENSIVE METABOLIC PANEL
ALT: 106 U/L — ABNORMAL HIGH (ref 0–44)
AST: 81 U/L — ABNORMAL HIGH (ref 15–41)
Albumin: 2.4 g/dL — ABNORMAL LOW (ref 3.5–5.0)
Alkaline Phosphatase: 51 U/L (ref 38–126)
Anion gap: 11 (ref 5–15)
BUN: 60 mg/dL — ABNORMAL HIGH (ref 8–23)
CO2: 16 mmol/L — ABNORMAL LOW (ref 22–32)
Calcium: 8.1 mg/dL — ABNORMAL LOW (ref 8.9–10.3)
Chloride: 111 mmol/L (ref 98–111)
Creatinine, Ser: 3.05 mg/dL — ABNORMAL HIGH (ref 0.61–1.24)
GFR, Estimated: 20 mL/min — ABNORMAL LOW (ref >60)
Glucose, Bld: 99 mg/dL (ref 70–99)
Potassium: 4.2 mmol/L (ref 3.5–5.1)
Sodium: 138 mmol/L (ref 135–145)
Total Bilirubin: 1.3 mg/dL — ABNORMAL HIGH (ref 0.3–1.2)
Total Protein: 5.8 g/dL — ABNORMAL LOW (ref 6.5–8.1)

## 2021-12-17 LAB — CK: Total CK: 1979 U/L — ABNORMAL HIGH (ref 49–397)

## 2021-12-17 NOTE — Progress Notes (Signed)
PROGRESS NOTE    Luis Strong  UXL:[id number] DOB: 11-16-1946 DOA: 12/11/2021 PCP: Juline Patch, MD    Brief Narrative:  76 year old M with PMH of CKD-3B, CAD, CVA, OSA on CPAP, HTN, GI bleed/esophageal ulcer, and nephrolithiasis s/p bilateral J stent on 12/05/2021 presenting with general malaise and fever, and admitted for severe sepsis due to complicated UTI.  Patient's son found him down on the floor and called EMS. CT renal stone study negative for hydronephrosis or obstruction, and the stents at right place.  Cultures obtained.  Patient was started on IV cefepime.  Urology consulted.   Blood culture with GPC.  Speciation Enterococcus faecalis.  Infectious disease involved.  Patient on IV ampicillin.  Clinically improving.   Assessment & Plan:   Principal Problem:   Severe sepsis ([place]) Active Problems:   HTN (hypertension)   OSA on CPAP   Complicated UTI (urinary tract infection)   Abnormal LFTs   Acute kidney injury superimposed on CKD lllb (HCC)   History of GI bleed   History of peptic ulcer   S/P cystoscopy with ureteral stent placement   High anion gap metabolic acidosis  - Severe sepsis due to bacteremia and complicated UTI and patient with nephrolithiasis s/p bilateral JJ stent on 12/05/2020 -Enterococcus faecalis bacteremia -Elevated liver function test secondary to sepsis  POA.  Had fever, tachycardia, tachypnea, AKI and lactic acidosis.  Culture data as above.  Lactic acidosis resolved.  Pro-Cal elevated to 7.92.  Leukocytosis improving. Kidney function improving Liver function improving Plan: Continue to monitor OFF IV fluids On IV ampicillin per ID recommendations, will need 14-day course Per ID may be able to transition to oral antibiotic, will need reassessment Monitor vitals and fever curve  Atrial fibrillation with rapid ventricular response New onset Required Cardizem gtt., now weaned off HR controlled Plan: Continue cardizem CD 180  daily Continue home metoprolol Defer anticoagulation for now as likely A. fib was driven by underlying severe sepsis.  Can re-visit this at later date   AKI on CKD-3B  Likely from rhabdomyolysis and sepsis.   CT renal stone study without hydronephrosis or obstruction Creatinine improving, still not at baseline Plan: Off IV fluids for 24 hours Monitor UOP Daily renal function   Anion gap metabolic acidosis  Likely due to renal failure.  Improved. -Continue monitoring   Possible fall at home Rhabdomyolysis CK elevated to 31,000.  Could be traumatic.   He was found down by his patient's son He is also active, working out 6 times weekly CK down to 2700 on 1/27, continues to downtrend  Plan: Hold IV for now Hold home statin Therapy evaluations Current recommendation for skilled nursing facility    Elevated liver enzymes/hyperbilirubinemia  Likely due to rhabdomyolysis.  RUQ [place] without acute finding.   Patient is s/p cholecystectomy.  No GI symptoms.  Improving. -Hold IV fluids -Daily CMP   Essential hypertension: Normotensive for most part. -Continue home metoprolol   OSA on CPAP -Continue nightly CPAP   History of GI bleed/esophageal ulcer -Continue home Protonix   Thrombocytopenia  Acute on chronic.  Likely due to sepsis. -Discontinue subcu Lovenox -SCD for VTE prophylaxis   DVT prophylaxis: SCD Code Status: Full Family Communication: Son at bedside 1/27 Disposition Plan: Status is: Inpatient  Remains inpatient appropriate because: Severe sepsis, AKI, rhabdomyolysis secondary to enterococcal bacteremia and fall with time down.  Disposition plan pending.   Level of care: Progressive  Consultants:  ID  Procedures:  None  Antimicrobials: Ampicillin  Subjective: Patient seen and examined.  Sitting up in bed.  No visible distress.  Cough and chest congestion improved  Objective: Vitals:   12/17/21 0357 12/17/21 0500 12/17/21 0814 12/17/21 1150   BP: (!) 131/91  (!) 160/77 (!) 155/77  Pulse: (!) 102  85 83  Resp: 16  16 16   Temp: (!) 97.2 F (36.2 C)  98.9 F (37.2 C) 97.9 F (36.6 C)  TempSrc:   Oral   SpO2: 98%  95% 94%  Weight:  96 kg    Height:        Intake/Output Summary (Last 24 hours) at 12/17/2021 1313 Last data filed at 12/17/2021 1100 Gross per 24 hour  Intake 120 ml  Output 2350 ml  Net -2230 ml   Filed Weights   12/15/21 0500 12/16/21 0500 12/17/21 0500  Weight: 96.2 kg 96.2 kg 96 kg    Examination:  General exam: No apparent distress Respiratory system: Mild bibasilar crackles.  Normal work of breathing.  Room air Cardiovascular system: S1-S2, RRR, no murmurs, no pedal edema Gastrointestinal system: Soft, NT/ND, normal bowel sounds Central nervous system: Alert and oriented. No focal neurological deficits. Extremities: Weakness, improving over interval Skin: No rashes, lesions or ulcers Psychiatry: Judgement and insight appear normal. Mood & affect appropriate.     Data Reviewed: I have personally reviewed following labs and imaging studies  CBC: Recent Labs  Lab 12/11/21 2114 12/12/21 0719 12/13/21 0629 12/14/21 0450 12/15/21 0541 12/16/21 0511 12/17/21 0533  WBC 13.8*   < > 9.1 10.2 9.9 9.0 10.4  NEUTROABS 12.5*  --   --  8.6* 8.2* 7.0 7.9*  HGB 16.7   < > 14.2 13.5 13.8 13.2 13.6  HCT 49.8   < > 43.3 39.4 41.9 39.3 40.3  MCV 84.4   < > 86.4 84.2 87.1 84.3 83.6  PLT 91*   < > 84* 96* 104* 135* 184   < > = values in this interval not displayed.   Basic Metabolic Panel: Recent Labs  Lab 12/12/21 0719 12/13/21 0629 12/14/21 0450 12/15/21 0541 12/16/21 0511 12/17/21 0533  NA 135 133* 134* 136 136 138  K 4.7 4.1 4.0 3.9 4.1 4.2  CL 102 104 107 108 110 111  CO2 19* 18* 19* 20* 16* 16*  GLUCOSE 130* 109* 115* 101* 100* 99  BUN 65* 75* 85* 77* 66* 60*  CREATININE 4.16* 4.38* 4.17* 3.60* 3.14* 3.05*  CALCIUM 8.4* 7.7* 7.8* 7.7* 7.9* 8.1*  MG 2.4 2.5*  --   --   --   --   PHOS   --  4.5  --   --   --   --    GFR: Estimated Creatinine Clearance: 23.5 mL/min (A) (by C-G formula based on SCr of 3.05 mg/dL (H)). Liver Function Tests: Recent Labs  Lab 12/13/21 0629 12/14/21 0450 12/15/21 0541 12/16/21 0511 12/17/21 0533  AST 390* 262* 166* 111* 81*  ALT 200* 192* 168* 133* 106*  ALKPHOS 36* 42 45 52 51  BILITOT 1.4* 1.0 1.2 1.2 1.3*  PROT 6.0* 5.6* 5.7* 5.7* 5.8*  ALBUMIN 2.9* 2.5* 2.6* 2.6* 2.4*   No results for input(s): LIPASE, AMYLASE in the last 168 hours. Recent Labs  Lab 12/12/21 1655  AMMONIA 28   Coagulation Profile: Recent Labs  Lab 12/11/21 2114 12/12/21 0719  INR 1.2 1.2   Cardiac Enzymes: Recent Labs  Lab 12/13/21 0629 12/14/21 0450 12/15/21 0541 12/16/21 0511 12/17/21 0533  CKTOTAL 81,856* 4,567* 2,716* 2,003* 1,979*  BNP (last 3 results) No results for input(s): PROBNP in the last 8760 hours. HbA1C: No results for input(s): HGBA1C in the last 72 hours. CBG: No results for input(s): GLUCAP in the last 168 hours. Lipid Profile: No results for input(s): CHOL, HDL, LDLCALC, TRIG, CHOLHDL, LDLDIRECT in the last 72 hours. Thyroid Function Tests: Recent Labs    12/15/21 0541  TSH 2.869   Anemia Panel: No results for input(s): VITAMINB12, FOLATE, FERRITIN, TIBC, IRON, RETICCTPCT in the last 72 hours. Sepsis Labs: Recent Labs  Lab 12/11/21 0045 12/11/21 2113 12/12/21 0719  PROCALCITON  --   --  7.92  LATICACIDVEN 2.0* 2.0* 1.8    Recent Results (from the past 240 hour(s))  Culture, blood (Routine x 2)     Status: Abnormal   Collection Time: 12/11/21  9:13 PM   Specimen: BLOOD  Result Value Ref Range Status   Specimen Description   Final    BLOOD LEFT FOREARM Performed at [facility], [address] [place], [place] [address]    Special Requests   Final    BOTTLES DRAWN AEROBIC AND ANAEROBIC Blood Culture adequate volume Performed at [facility], [address] [place], [place]  [address]    Culture  Setup Time   Final    GRAM POSITIVE COCCI IN BOTH AEROBIC AND ANAEROBIC BOTTLES CRITICAL RESULT CALLED TO, READ BACK BY AND VERIFIED WITH: MORGAN HICKS 12/12/21 1205 MW    Culture (A)  Final    ENTEROCOCCUS FAECALIS SUSCEPTIBILITIES PERFORMED ON PREVIOUS CULTURE WITHIN THE LAST 5 DAYS. Performed at [facility], [place] [address], [place], [place] [address]    Report Status 12/14/2021 FINAL  Final  Culture, blood (Routine x 2)     Status: Abnormal   Collection Time: 12/11/21  9:13 PM   Specimen: BLOOD  Result Value Ref Range Status   Specimen Description   Final    BLOOD RW Performed at [facility], [address], [place], [place] [address]    Special Requests   Final    BOTTLES DRAWN AEROBIC AND ANAEROBIC BCAV Performed at [facility], [address] [place],  [address]    Culture  Setup Time   Final    GRAM POSITIVE COCCI IN BOTH AEROBIC AND ANAEROBIC BOTTLES CRITICAL RESULT CALLED TO, READ BACK BY AND VERIFIED WITH: MORGAN HICKS 12/12/21 1205 MW    Culture ENTEROCOCCUS FAECALIS (A)  Final   Report Status 12/14/2021 FINAL  Final   Organism ID, Bacteria ENTEROCOCCUS FAECALIS  Final      Susceptibility   Enterococcus faecalis - MIC*    AMPICILLIN <=2 SENSITIVE Sensitive     VANCOMYCIN 1 SENSITIVE Sensitive     GENTAMICIN SYNERGY SENSITIVE Sensitive     * ENTEROCOCCUS FAECALIS  Blood Culture ID Panel (Reflexed)     Status: Abnormal   Collection Time: 12/11/21  9:13 PM  Result Value Ref Range Status   Enterococcus faecalis DETECTED (A) NOT DETECTED Final    Comment: CRITICAL RESULT CALLED TO, READ BACK BY AND VERIFIED WITH: MORGAN HICKS 12/12/21 1206 MW    Enterococcus Faecium NOT DETECTED NOT DETECTED Final   Listeria monocytogenes NOT DETECTED NOT DETECTED Final   Staphylococcus species NOT DETECTED NOT DETECTED Final   Staphylococcus aureus (BCID) NOT DETECTED NOT DETECTED Final   Staphylococcus epidermidis NOT  DETECTED NOT DETECTED Final   Staphylococcus lugdunensis NOT DETECTED NOT DETECTED Final   Streptococcus species NOT DETECTED NOT DETECTED Final  Streptococcus agalactiae NOT DETECTED NOT DETECTED Final   Streptococcus pneumoniae NOT DETECTED NOT DETECTED Final   Streptococcus pyogenes NOT DETECTED NOT DETECTED Final   A.calcoaceticus-baumannii NOT DETECTED NOT DETECTED Final   Bacteroides fragilis NOT DETECTED NOT DETECTED Final   Enterobacterales NOT DETECTED NOT DETECTED Final   Enterobacter cloacae complex NOT DETECTED NOT DETECTED Final   Escherichia coli NOT DETECTED NOT DETECTED Final   Klebsiella aerogenes NOT DETECTED NOT DETECTED Final   Klebsiella oxytoca NOT DETECTED NOT DETECTED Final   Klebsiella pneumoniae NOT DETECTED NOT DETECTED Final   Proteus species NOT DETECTED NOT DETECTED Final   Salmonella species NOT DETECTED NOT DETECTED Final   Serratia marcescens NOT DETECTED NOT DETECTED Final   Haemophilus influenzae NOT DETECTED NOT DETECTED Final   Neisseria meningitidis NOT DETECTED NOT DETECTED Final   Pseudomonas aeruginosa NOT DETECTED NOT DETECTED Final   Stenotrophomonas maltophilia NOT DETECTED NOT DETECTED Final   Candida albicans NOT DETECTED NOT DETECTED Final   Candida auris NOT DETECTED NOT DETECTED Final   Candida glabrata NOT DETECTED NOT DETECTED Final   Candida krusei NOT DETECTED NOT DETECTED Final   Candida parapsilosis NOT DETECTED NOT DETECTED Final   Candida tropicalis NOT DETECTED NOT DETECTED Final   Cryptococcus neoformans/gattii NOT DETECTED NOT DETECTED Final   Vancomycin resistance NOT DETECTED NOT DETECTED Final    Comment: Performed at [facility], [address] [place], [place] [address]  Resp Panel by RT-PCR (Flu A&B, Covid) Nasopharyngeal Swab     Status: None   Collection Time: 12/11/21  9:14 PM   Specimen: Nasopharyngeal Swab; Nasopharyngeal(NP) swabs in vial transport medium  Result Value Ref Range Status   SARS  Coronavirus 2 by RT PCR NEGATIVE NEGATIVE Final    Comment: (NOTE) SARS-CoV-2 target nucleic acids are NOT DETECTED.  The SARS-CoV-2 RNA is generally detectable in upper respiratory specimens during the acute phase of infection. The lowest concentration of SARS-CoV-2 viral copies this assay can detect is 138 copies/mL. A negative result does not preclude SARS-Cov-2 infection and should not be used as the sole basis for treatment or other patient management decisions. A negative result may occur with  improper specimen collection/handling, submission of specimen other than nasopharyngeal swab, presence of viral mutation(s) within the areas targeted by this assay, and inadequate number of viral copies(<138 copies/mL). A negative result must be combined with clinical observations, patient history, and epidemiological information. The expected result is Negative.  Fact Sheet for Patients:  EntrepreneurPulse.com.au  Fact Sheet for Healthcare Providers:  IncredibleEmployment.be  This test is no t yet approved or cleared by the [place] FDA and  has been authorized for detection and/or diagnosis of SARS-CoV-2 by FDA under an Emergency Use Authorization (EUA). This EUA will remain  in effect (meaning this test can be used) for the duration of the COVID-19 declaration under Section 564(b)(1) of the Act, 21 U.S.C.section 360bbb-3(b)(1), unless the authorization is terminated  or revoked sooner.       Influenza A by PCR NEGATIVE NEGATIVE Final   Influenza B by PCR NEGATIVE NEGATIVE Final    Comment: (NOTE) The Xpert Xpress SARS-CoV-2/FLU/RSV plus assay is intended as an aid in the diagnosis of influenza from Nasopharyngeal swab specimens and should not be used as a sole basis for treatment. Nasal washings and aspirates are unacceptable for Xpert Xpress SARS-CoV-2/FLU/RSV testing.  Fact Sheet for  Patients: EntrepreneurPulse.com.au  Fact Sheet for Healthcare Providers: IncredibleEmployment.be  This test is not yet approved or cleared  by the [place] and has been authorized for detection and/or diagnosis of SARS-CoV-2 by FDA under an Emergency Use Authorization (EUA). This EUA will remain in effect (meaning this test can be used) for the duration of the COVID-19 declaration under Section 564(b)(1) of the Act, 21 U.S.C. section 360bbb-3(b)(1), unless the authorization is terminated or revoked.  Performed at [facility], [address] [place], [place] [address]   Urine Culture     Status: Abnormal   Collection Time: 12/11/21 10:27 PM   Specimen: Urine, Random  Result Value Ref Range Status   Specimen Description   Final    URINE, RANDOM Performed at [facility], [address] [place], [place] [address]    Special Requests   Final    NONE Performed at [facility], [address] [place], [place] [address]    Culture >=100,000 COLONIES/mL ENTEROCOCCUS FAECALIS (A)  Final   Report Status 12/14/2021 FINAL  Final   Organism ID, Bacteria ENTEROCOCCUS FAECALIS (A)  Final      Susceptibility   Enterococcus faecalis - MIC*    AMPICILLIN <=2 SENSITIVE Sensitive     NITROFURANTOIN <=16 SENSITIVE Sensitive     VANCOMYCIN 1 SENSITIVE Sensitive     * >=100,000 COLONIES/mL ENTEROCOCCUS FAECALIS  CULTURE, BLOOD (ROUTINE X 2) w Reflex to ID Panel     Status: None (Preliminary result)   Collection Time: 12/14/21  4:50 AM   Specimen: BLOOD  Result Value Ref Range Status   Specimen Description BLOOD BLOOD RIGHT WRIST  Final   Special Requests   Final    BOTTLES DRAWN AEROBIC AND ANAEROBIC Blood Culture adequate volume   Culture   Final    NO GROWTH 3 DAYS Performed at [facility], [address], [place], [place] [address]    Report Status PENDING  Incomplete  CULTURE, BLOOD (ROUTINE X 2) w  Reflex to ID Panel     Status: None (Preliminary result)   Collection Time: 12/14/21  4:50 AM   Specimen: BLOOD  Result Value Ref Range Status   Specimen Description BLOOD BLOOD RIGHT HAND  Final   Special Requests   Final    BOTTLES DRAWN AEROBIC AND ANAEROBIC Blood Culture adequate volume   Culture   Final    NO GROWTH 3 DAYS Performed at [facility], [address] [place], [place] [address]    Report Status PENDING  Incomplete         Radiology Studies: DG Chest Port 1 View  Result Date: 12/16/2021 CLINICAL DATA:  Cough EXAM: PORTABLE CHEST 1 VIEW COMPARISON:  12/12/2021 FINDINGS: Transverse diameter of heart is increased. There is interval decrease in interstitial markings in both lungs. No new focal infiltrates are seen. There is no pleural effusion or pneumothorax. IMPRESSION: There is interval decrease in interstitial markings in both lungs suggesting resolving interstitial edema or interstitial pneumonia. There are no new focal infiltrates. There is no pleural effusion. Electronically Signed   By: Elmer Picker M.D.   On: 12/16/2021 12:14        Scheduled Meds:  aspirin EC  81 mg Oral Daily   citalopram  40 mg Oral Daily   darifenacin  7.5 mg Oral Daily   diltiazem  180 mg Oral Daily   metoprolol tartrate  25 mg Oral BID   multivitamin with minerals  1 tablet Oral Daily   pantoprazole  40 mg Oral Daily   Continuous Infusions:  ampicillin (OMNIPEN) IV  2 g (12/17/21 0513)     LOS: 5 days    Time spent: 35 minutes    Sidney Ace, MD [facility]   If 7PM-7AM, please contact night-coverage  12/17/2021, 1:13 PM

## 2021-12-18 DIAGNOSIS — B952 Enterococcus as the cause of diseases classified elsewhere: Secondary | ICD-10-CM | POA: Diagnosis not present

## 2021-12-18 DIAGNOSIS — N179 Acute kidney failure, unspecified: Secondary | ICD-10-CM | POA: Diagnosis not present

## 2021-12-18 DIAGNOSIS — R7881 Bacteremia: Secondary | ICD-10-CM | POA: Diagnosis not present

## 2021-12-18 DIAGNOSIS — N390 Urinary tract infection, site not specified: Secondary | ICD-10-CM | POA: Diagnosis not present

## 2021-12-18 DIAGNOSIS — T796XXA Traumatic ischemia of muscle, initial encounter: Secondary | ICD-10-CM | POA: Diagnosis not present

## 2021-12-18 LAB — CBC WITH DIFFERENTIAL/PLATELET
Abs Immature Granulocytes: 0.49 10*3/uL — ABNORMAL HIGH (ref 0.00–0.07)
Basophils Absolute: 0.1 10*3/uL (ref 0.0–0.1)
Basophils Relative: 1 %
Eosinophils Absolute: 0.4 10*3/uL (ref 0.0–0.5)
Eosinophils Relative: 4 %
HCT: 39.2 % (ref 39.0–52.0)
Hemoglobin: 13.2 g/dL (ref 13.0–17.0)
Immature Granulocytes: 5 %
Lymphocytes Relative: 9 %
Lymphs Abs: 1 10*3/uL (ref 0.7–4.0)
MCH: 28.3 pg (ref 26.0–34.0)
MCHC: 33.7 g/dL (ref 30.0–36.0)
MCV: 83.9 fL (ref 80.0–100.0)
Monocytes Absolute: 0.9 10*3/uL (ref 0.1–1.0)
Monocytes Relative: 8 %
Neutro Abs: 7.8 10*3/uL — ABNORMAL HIGH (ref 1.7–7.7)
Neutrophils Relative %: 73 %
Platelets: 222 10*3/uL (ref 150–400)
RBC: 4.67 MIL/uL (ref 4.22–5.81)
RDW: 14.4 % (ref 11.5–15.5)
WBC: 10.6 10*3/uL — ABNORMAL HIGH (ref 4.0–10.5)
nRBC: 0 % (ref 0.0–0.2)

## 2021-12-18 LAB — COMPREHENSIVE METABOLIC PANEL
ALT: 92 U/L — ABNORMAL HIGH (ref 0–44)
AST: 70 U/L — ABNORMAL HIGH (ref 15–41)
Albumin: 2.5 g/dL — ABNORMAL LOW (ref 3.5–5.0)
Alkaline Phosphatase: 50 U/L (ref 38–126)
Anion gap: 9 (ref 5–15)
BUN: 53 mg/dL — ABNORMAL HIGH (ref 8–23)
CO2: 21 mmol/L — ABNORMAL LOW (ref 22–32)
Calcium: 8.2 mg/dL — ABNORMAL LOW (ref 8.9–10.3)
Chloride: 109 mmol/L (ref 98–111)
Creatinine, Ser: 2.91 mg/dL — ABNORMAL HIGH (ref 0.61–1.24)
GFR, Estimated: 22 mL/min — ABNORMAL LOW (ref >60)
Glucose, Bld: 112 mg/dL — ABNORMAL HIGH (ref 70–99)
Potassium: 4 mmol/L (ref 3.5–5.1)
Sodium: 139 mmol/L (ref 135–145)
Total Bilirubin: 1 mg/dL (ref 0.3–1.2)
Total Protein: 5.8 g/dL — ABNORMAL LOW (ref 6.5–8.1)

## 2021-12-18 LAB — CK: Total CK: 1741 U/L — ABNORMAL HIGH (ref 49–397)

## 2021-12-18 MED ORDER — LACTATED RINGERS IV SOLN: INTRAVENOUS | Status: DC

## 2021-12-18 NOTE — Progress Notes (Signed)
Pharmacy Antibiotic Note  Luis Strong is a 76 y.o. male admitted on 12/11/2021 with sepsis secondary to bacteremia. He had cystoscopy with laser lithotripsy and stent placement 1/17  (preop Urine cx on 1/13 was no growth)   1/23 blood cultures: 4/4 GPC, BCID = E faecalis without vancomycin resistance  Today,12/18/2021 - renal: AKI on CKD d/t rhabdomyolysis. SCr improving (slowly) - CK noted to be 31,000 at admission - improving - LFTs improving - repeat Bcx NGTD from 1/26  Plan: Continue ampicillin 2gm IV q8h as CrCl currently 58ml/min Monitor renal function - adjust dose once CrCl > 30 ml/min Follow ID recommendations - plan 14 days of antibiotic  Height: 5\' 9"  (175.3 cm) Weight: 93.5 kg (206 lb 2.1 oz) IBW/kg (Calculated) : 70.7  Temp (24hrs), Avg:98 F (36.7 C), Min:97.2 F (36.2 C), Max:98.4 F (36.9 C)  Recent Labs  Lab 12/11/21 2113 12/11/21 2114 12/12/21 0719 12/13/21 0629 12/14/21 0450 12/15/21 0541 12/16/21 0511 12/17/21 0533 12/18/21 0544  WBC  --    < > 11.6*   < > 10.2 9.9 9.0 10.4 10.6*  CREATININE  --    < > 4.16*   < > 4.17* 3.60* 3.14* 3.05* 2.91*  LATICACIDVEN 2.0*  --  1.8  --   --   --   --   --   --    < > = values in this interval not displayed.     Estimated Creatinine Clearance: 24.4 mL/min (A) (by C-G formula based on SCr of 2.91 mg/dL (H)).    Allergies  Allergen Reactions   Capsaicin Itching and Rash    Severe rash and itching.    Antimicrobials this admission: 1/23 Cefepime >> 1/23 1/24 ampicillin >>  Microbiology results: 1/23 BCx: 4/4 GPC, E faecalis 1/23 UCx: E faecalis 1/26 BcxL NGTD  Thank you for allowing pharmacy to be a part of this patients care.  Doreene Eland, PharmD, BCPS, BCIDP Work Cell: [phone] 12/18/2021 1:52 PM

## 2021-12-18 NOTE — Progress Notes (Signed)
Physical Therapy Treatment Patient Details Name: Luis Strong MRN: [id number] DOB: 1946/04/26 Today's Date: 12/18/2021   History of Present Illness Luis Strong is a 76 y.o. male with medical history significant for CKD 3B, HTN, CAD, CVA, OSA on CPAP, history of GI bleed 2020 with nonbleeding esophageal ulcer on EGD, nephrolithiasis s/p ureteroscopy with bilateral J stent placement on 1/17, who presents to the ED with generalized malaise and fever starting 2 days after his procedure.  He denies abdominal pain, nausea or vomiting and denies diarrhea.  Has no cough, shortness of breath or chest pain.    PT Comments    Patient received in bed, external catheter laying next to him in bed. Patient and bed found to be soaking wet.  He continues to demonstrate poor awareness, poor decision making and confusion. Patient is able to perform bed mobility with supervision and use of bed rails. Min assist needed to stand from bed. He requires RW and min guard for safety with ambulating out in hallway. Poor safety awareness with mobility. He will not be safe to return home alone at this time. Would need 24 hour supervision.     Recommendations for follow up therapy are one component of a multi-disciplinary discharge planning process, led by the attending physician.  Recommendations may be updated based on patient status, additional functional criteria and insurance authorization.  Follow Up Recommendations  Skilled nursing-short term rehab (<3 hours/day)     Assistance Recommended at Discharge Frequent or constant Supervision/Assistance  Patient can return home with the following A little help with bathing/dressing/bathroom;Assistance with cooking/housework;Assist for transportation;Help with stairs or ramp for entrance;A little help with walking and/or transfers;Direct supervision/assist for medications management   Equipment Recommendations  Rolling walker (2 wheels)    Recommendations for  Other Services       Precautions / Restrictions Precautions Precautions: Fall Precaution Comments: poor awareness Restrictions Weight Bearing Restrictions: No     Mobility  Bed Mobility Overal bed mobility: Needs Assistance Bed Mobility: Supine to Sit     Supine to sit: Supervision     General bed mobility comments: supervision for safety. Poor safety awareness, poor problem solving. Patient had removed external catheter and was soaking wet in bed.    Transfers Overall transfer level: Needs assistance Equipment used: Rolling walker (2 wheels) Transfers: Sit to/from Stand Sit to Stand: Min assist           General transfer comment: min assist needed to boost up to standing    Ambulation/Gait Ambulation/Gait assistance: Min guard Gait Distance (Feet): 125 Feet Assistive device: Rolling walker (2 wheels) Gait Pattern/deviations: Step-through pattern, Decreased step length - right, Decreased step length - left Gait velocity: decreased     General Gait Details: patient with poor awareness, running into door jams with walker, requires min guard for safety. Breathing heavily with mobility. HR up to 110s   Stairs             Wheelchair Mobility    Modified Rankin (Stroke Patients Only)       Balance Overall balance assessment: Needs assistance Sitting-balance support: Feet supported Sitting balance-Leahy Scale: Good Sitting balance - Comments: Requires MIN GUARD for reaching outside BOS to don/doff socks   Standing balance support: Bilateral upper extremity supported, During functional activity Standing balance-Leahy Scale: Poor Standing balance comment: mod balance/safety deficits with all standing activity  Cognition Arousal/Alertness: Awake/alert Behavior During Therapy: WFL for tasks assessed/performed Overall Cognitive Status: Impaired/Different from baseline Area of Impairment: Problem solving,  Safety/judgement, Awareness                     Memory: Decreased short-term memory Following Commands: Follows one step commands consistently Safety/Judgement: Decreased awareness of safety, Decreased awareness of deficits Awareness: Intellectual Problem Solving: Requires verbal cues, Requires tactile cues General Comments: Pt is pleasant and cooperative.        Exercises      General Comments        Pertinent Vitals/Pain Pain Assessment Pain Assessment: No/denies pain    Home Living                          Prior Function            PT Goals (current goals can now be found in the care plan section) Acute Rehab PT Goals Patient Stated Goal: go home PT Goal Formulation: With patient Time For Goal Achievement: 12/26/21 Potential to Achieve Goals: Fair Progress towards PT goals: Progressing toward goals    Frequency    Min 2X/week      PT Plan Current plan remains appropriate    Co-evaluation              AM-PAC PT "6 Clicks" Mobility   Outcome Measure  Help needed turning from your back to your side while in a flat bed without using bedrails?: A Little Help needed moving from lying on your back to sitting on the side of a flat bed without using bedrails?: A Little Help needed moving to and from a bed to a chair (including a wheelchair)?: A Little Help needed standing up from a chair using your arms (e.g., wheelchair or bedside chair)?: A Little Help needed to walk in hospital room?: A Lot Help needed climbing 3-5 steps with a railing? : A Lot 6 Click Score: 16    End of Session Equipment Utilized During Treatment: Gait belt Activity Tolerance: Patient limited by fatigue Patient left: in chair;with chair alarm set;with call bell/phone within reach Nurse Communication: Mobility status (patient has rash on back, bed was soaking wet after external catheter removed by patient.) PT Visit Diagnosis: Unsteadiness on feet (R26.81);Other  abnormalities of gait and mobility (R26.89);Muscle weakness (generalized) (M62.81);Difficulty in walking, not elsewhere classified (R26.2);History of falling (Z91.81)     Time: 9675-9163 PT Time Calculation (min) (ACUTE ONLY): 15 min  Charges:  $Gait Training: 8-22 mins                     Rosemaria Inabinet, PT, GCS 12/18/21,10:23 AM

## 2021-12-18 NOTE — Progress Notes (Signed)
Occupational Therapy Treatment Patient Details Name: DAMEAN POFFENBERGER MRN: [id number] DOB: 05-20-1946 Today's Date: 12/18/2021   History of present illness NATANIEL GASPER is a 76 y.o. male with medical history significant for CKD 3B, HTN, CAD, CVA, OSA on CPAP, history of GI bleed 2020 with nonbleeding esophageal ulcer on EGD, nephrolithiasis s/p ureteroscopy with bilateral J stent placement on 1/17, who presents to the ED with generalized malaise and fever starting 2 days after his procedure.  He denies abdominal pain, nausea or vomiting and denies diarrhea.  Has no cough, shortness of breath or chest pain.   OT comments  Pt seen for OT treatment on this date. Upon arrival to room, pt awake and seated upright in recliner following PT session. Pt continues to present with cognition different from baseline; pt disoriented to situation and unable to clearly articulate why his bed sheets were off his bed (per PT, pt was unaware he was soiled in urine at start of PT session). This date, pt engaged in IADL task of changing bed linens; pt required MIN GUARD for dynamic balance, MIN A to put fitted sheet on bed, and MOD verbal cues for sequencing task and implementing energy conservation strategies when appearing short of breath (SpO2>94%, HR 88-92bpm throughout session). Of note, pt observed to have x1 lateral LOB however was able to regain balance without physical assist. Pt also demonstrated decreased awareness of deficits this date; pt was observed to have increased work of breath during session, however reported that activities in session were "light". Pt is making good progress toward goals and continues to benefit from skilled OT services to maximize return to PLOF and minimize risk of future falls, injury, caregiver burden, and readmission. Will continue to follow POC. Discharge recommendation remains appropriate.     Recommendations for follow up therapy are one component of a multi-disciplinary  discharge planning process, led by the attending physician.  Recommendations may be updated based on patient status, additional functional criteria and insurance authorization.    Follow Up Recommendations  Skilled nursing-short term rehab (<3 hours/day)    Assistance Recommended at Discharge Frequent or constant Supervision/Assistance  Patient can return home with the following  A little help with walking and/or transfers;A little help with bathing/dressing/bathroom;Direct supervision/assist for medications management;Direct supervision/assist for financial management;Assist for transportation;Help with stairs or ramp for entrance;Assistance with cooking/housework   Equipment Recommendations  Other (comment) (defer to next venue of care)    Recommendations for Other Services      Precautions / Restrictions Precautions Precautions: Fall Precaution Comments: poor awareness Restrictions Weight Bearing Restrictions: No       Mobility Bed Mobility               General bed mobility comments: not assessed, pt in recliner at beginning/end of session    Transfers Overall transfer level: Needs assistance Equipment used: None Transfers: Sit to/from Stand Sit to Stand: Min assist           General transfer comment: MIN A for steadying when initially upright     Balance Overall balance assessment: Needs assistance Sitting-balance support: No upper extremity supported, Feet supported Sitting balance-Leahy Scale: Good     Standing balance support: Single extremity supported, During functional activity Standing balance-Leahy Scale: Fair Standing balance comment: Requires MIN GUARD to walk around room (without AD) during IADL tasks. Pt observed to have x1 lateral LOB and was "furniture surfing" at times  ADL either performed or assessed with clinical judgement   ADL Overall ADL's : Needs assistance/impaired                                      Functional mobility during ADLs: Min guard (to walk 18ft in room while engaging in IADLs)        Cognition Arousal/Alertness: Awake/alert Behavior During Therapy: WFL for tasks assessed/performed Overall Cognitive Status: Impaired/Different from baseline Area of Impairment: Problem solving, Safety/judgement, Awareness                     Memory: Decreased short-term memory Following Commands: Follows one step commands consistently Safety/Judgement: Decreased awareness of safety, Decreased awareness of deficits Awareness: Intellectual Problem Solving: Requires verbal cues, Requires tactile cues General Comments: A&Ox3, however demonstrates decreased awareness of deficits (was observed to have increased work of breath during session, but reported the session as "light" activity. Pt continues to report that his memory is different from baseline        Exercises Other Exercises Other Exercises: Pt engaged in IADL task of changing bed linens. Pt required MIN GUARD for dynamic balance, MIN A to assist with putting fitted sheet on bed, and MOD verbal cues for sequencing task and implementing energy conservation strategies when appearing short of breath (SpO2>94%, HR 88-92bpm throughout session)            Pertinent Vitals/ Pain       Pain Assessment Pain Assessment: No/denies pain         Frequency  Min 3X/week        Progress Toward Goals  OT Goals(current goals can now be found in the care plan section)  Progress towards OT goals: Progressing toward goals  Acute Rehab OT Goals Patient Stated Goal: to go home and be independent OT Goal Formulation: With patient Time For Goal Achievement: 12/26/21 Potential to Achieve Goals: Good  Plan Frequency remains appropriate;Discharge plan remains appropriate       AM-PAC OT "6 Clicks" Daily Activity     Outcome Measure   Help from another person eating meals?: None Help from another person  taking care of personal grooming?: A Little Help from another person toileting, which includes using toliet, bedpan, or urinal?: A Lot Help from another person bathing (including washing, rinsing, drying)?: A Lot Help from another person to put on and taking off regular upper body clothing?: A Little Help from another person to put on and taking off regular lower body clothing?: A Little 6 Click Score: 17    End of Session Equipment Utilized During Treatment: Gait belt  OT Visit Diagnosis: Unsteadiness on feet (R26.81);History of falling (Z91.81)   Activity Tolerance Patient tolerated treatment well   Patient Left in chair;with call bell/phone within reach;with chair alarm set   Nurse Communication Mobility status        Time: 8003-4917 OT Time Calculation (min): 23 min  Charges: OT General Charges $OT Visit: 1 Visit OT Treatments $Self Care/Home Management : 23-37 mins  Fredirick Maudlin, [place]

## 2021-12-18 NOTE — Care Management Important Message (Signed)
Important Message  Patient Details  Name: Luis Strong MRN: [id number] Date of Birth: 03/15/1946   Medicare Important Message Given:  Yes     Dannette Barbara 12/18/2021, 11:40 AM

## 2021-12-18 NOTE — Progress Notes (Signed)
PROGRESS NOTE    Luis Strong  VZD:[id number] DOB: [birth year] DOA: 12/11/2021 PCP: Juline Patch, MD    Brief Narrative:  76 year old M with PMH of CKD-3B, CAD, CVA, OSA on CPAP, HTN, GI bleed/esophageal ulcer, and nephrolithiasis s/p bilateral J stent on 12/05/2021 presenting with general malaise and fever, and admitted for severe sepsis due to complicated UTI.  Patient's son found him down on the floor and called EMS. CT renal stone study negative for hydronephrosis or obstruction, and the stents at right place.  Cultures obtained.  Patient was started on IV cefepime.  Urology consulted.   Blood culture with GPC.  Speciation Enterococcus faecalis.  Infectious disease involved.  Patient on IV ampicillin.  Clinically improving.   Assessment & Plan:   Principal Problem:   Severe sepsis ([place]) Active Problems:   HTN (hypertension)   OSA on CPAP   Complicated UTI (urinary tract infection)   Abnormal LFTs   Acute kidney injury superimposed on CKD lllb (HCC)   History of GI bleed   History of peptic ulcer   S/P cystoscopy with ureteral stent placement   High anion gap metabolic acidosis  - Severe sepsis due to bacteremia and complicated UTI and patient with nephrolithiasis s/p bilateral JJ stent on 12/05/2020 -Enterococcus faecalis bacteremia -Elevated liver function test secondary to sepsis  POA.  Had fever, tachycardia, tachypnea, AKI and lactic acidosis.  Culture data as above.  Lactic acidosis resolved.  Pro-Cal elevated to 7.92.  Leukocytosis improving. Kidney function improving Liver function improving Plan: Restart IV fluids lactated Ringer's 100 cc/h for 24 hours On IV ampicillin per ID recommendations, will need 14-day course Per ID may be able to transition to oral antibiotic, will need reassessment Monitor vitals and fever curve  Atrial fibrillation with rapid ventricular response New onset Required Cardizem gtt., now weaned off HR controlled Plan: Continue  cardizem CD 180 daily Continue home metoprolol Defer anticoagulation for now as likely A. fib was driven by underlying severe sepsis.  Can re-visit this at later date once kidney function improved   AKI on CKD-3B  Likely from rhabdomyolysis and sepsis.   CT renal stone study without hydronephrosis or obstruction Creatinine improving, still not at baseline Plan: Resume IV fluids for next 24 hours Monitor UOP Daily renal function   Anion gap metabolic acidosis  Likely due to renal failure.  Improved. -Continue monitoring   Possible fall at home Rhabdomyolysis CK elevated to 31,000.  Could be traumatic.   He was found down by his patient's son He is also active, working out 6 times weekly CK down to 2700 on 1/27, continues to downtrend  Plan: Resume IV fluids for next 24 hours Hold home statin Therapy evaluations Current recommendation for skilled nursing facility    Elevated liver enzymes/hyperbilirubinemia  Likely due to rhabdomyolysis.  RUQ [place] without acute finding.   Patient is s/p cholecystectomy.  No GI symptoms.  Improving. -On IV fluids as above -Daily CMP   Essential hypertension: Normotensive for most part. -Continue home metoprolol   OSA on CPAP -Continue nightly CPAP   History of GI bleed/esophageal ulcer -Continue home Protonix   Thrombocytopenia  Acute on chronic.  Likely due to sepsis. -Discontinue subcu Lovenox -SCD for VTE prophylaxis   DVT prophylaxis: SCD Code Status: Full Family Communication: Son at bedside 1/27 Disposition Plan: Status is: Inpatient  Remains inpatient appropriate because: Severe sepsis, AKI, rhabdomyolysis secondary to enterococcal bacteremia and fall with time down.  Anticipate need for skilled nursing facility  Level of care: Progressive  Consultants:  ID  Procedures:  None  Antimicrobials: Ampicillin   Subjective: Patient seen and examined.  Lying in bed.  No visible distress.  No pain  complaints  Objective: Vitals:   12/17/21 2350 12/18/21 0500 12/18/21 0756 12/18/21 1016  BP: (!) 152/82  (!) 146/76   Pulse: 80  92   Resp: 18  16   Temp: 98.2 F (36.8 C)  98.1 F (36.7 C)   TempSrc:      SpO2: 95%  97% 96%  Weight:  93.5 kg    Height:        Intake/Output Summary (Last 24 hours) at 12/18/2021 1144 Last data filed at 12/18/2021 0524 Gross per 24 hour  Intake --  Output 2250 ml  Net -2250 ml   Filed Weights   12/16/21 0500 12/17/21 0500 12/18/21 0500  Weight: 96.2 kg 96 kg 93.5 kg    Examination:  General exam: No acute distress Respiratory system: Lungs clear.  Normal work of breathing.  Room air Cardiovascular system: S1-S2, RRR, no murmurs, no pedal edema Gastrointestinal system: Soft, NT/ND, normal bowel sounds Central nervous system: Alert and oriented. No focal neurological deficits. Extremities: Weakness, improving over interval Skin: No rashes, lesions or ulcers Psychiatry: Judgement and insight appear normal. Mood & affect appropriate.     Data Reviewed: I have personally reviewed following labs and imaging studies  CBC: Recent Labs  Lab 12/14/21 0450 12/15/21 0541 12/16/21 0511 12/17/21 0533 12/18/21 0544  WBC 10.2 9.9 9.0 10.4 10.6*  NEUTROABS 8.6* 8.2* 7.0 7.9* 7.8*  HGB 13.5 13.8 13.2 13.6 13.2  HCT 39.4 41.9 39.3 40.3 39.2  MCV 84.2 87.1 84.3 83.6 83.9  PLT 96* 104* 135* 184 287   Basic Metabolic Panel: Recent Labs  Lab 12/12/21 0719 12/13/21 0629 12/14/21 0450 12/15/21 0541 12/16/21 0511 12/17/21 0533 12/18/21 0544  NA 135 133* 134* 136 136 138 139  K 4.7 4.1 4.0 3.9 4.1 4.2 4.0  CL 102 104 107 108 110 111 109  CO2 19* 18* 19* 20* 16* 16* 21*  GLUCOSE 130* 109* 115* 101* 100* 99 112*  BUN 65* 75* 85* 77* 66* 60* 53*  CREATININE 4.16* 4.38* 4.17* 3.60* 3.14* 3.05* 2.91*  CALCIUM 8.4* 7.7* 7.8* 7.7* 7.9* 8.1* 8.2*  MG 2.4 2.5*  --   --   --   --   --   PHOS  --  4.5  --   --   --   --   --    GFR: Estimated  Creatinine Clearance: 24.4 mL/min (A) (by C-G formula based on SCr of 2.91 mg/dL (H)). Liver Function Tests: Recent Labs  Lab 12/14/21 0450 12/15/21 0541 12/16/21 0511 12/17/21 0533 12/18/21 0544  AST 262* 166* 111* 81* 70*  ALT 192* 168* 133* 106* 92*  ALKPHOS 42 45 52 51 50  BILITOT 1.0 1.2 1.2 1.3* 1.0  PROT 5.6* 5.7* 5.7* 5.8* 5.8*  ALBUMIN 2.5* 2.6* 2.6* 2.4* 2.5*   No results for input(s): LIPASE, AMYLASE in the last 168 hours. Recent Labs  Lab 12/12/21 1655  AMMONIA 28   Coagulation Profile: Recent Labs  Lab 12/11/21 2114 12/12/21 0719  INR 1.2 1.2   Cardiac Enzymes: Recent Labs  Lab 12/14/21 0450 12/15/21 0541 12/16/21 0511 12/17/21 0533 12/18/21 0544  CKTOTAL 4,567* 2,716* 2,003* 1,979* 1,741*   BNP (last 3 results) No results for input(s): PROBNP in the last 8760 hours. HbA1C: No results for input(s): HGBA1C in the  last 72 hours. CBG: No results for input(s): GLUCAP in the last 168 hours. Lipid Profile: No results for input(s): CHOL, HDL, LDLCALC, TRIG, CHOLHDL, LDLDIRECT in the last 72 hours. Thyroid Function Tests: No results for input(s): TSH, T4TOTAL, FREET4, T3FREE, THYROIDAB in the last 72 hours.  Anemia Panel: No results for input(s): VITAMINB12, FOLATE, FERRITIN, TIBC, IRON, RETICCTPCT in the last 72 hours. Sepsis Labs: Recent Labs  Lab 12/11/21 2113 12/12/21 0719  PROCALCITON  --  7.92  LATICACIDVEN 2.0* 1.8    Recent Results (from the past 240 hour(s))  Culture, blood (Routine x 2)     Status: Abnormal   Collection Time: 12/11/21  9:13 PM   Specimen: BLOOD  Result Value Ref Range Status   Specimen Description   Final    BLOOD LEFT FOREARM Performed at [facility], [address] [place], [place] [address]    Special Requests   Final    BOTTLES DRAWN AEROBIC AND ANAEROBIC Blood Culture adequate volume Performed at [facility], [address] [place], [place] [address]    Culture  Setup Time   Final     GRAM POSITIVE COCCI IN BOTH AEROBIC AND ANAEROBIC BOTTLES CRITICAL RESULT CALLED TO, READ BACK BY AND VERIFIED WITH: MORGAN HICKS 12/12/21 1205 MW    Culture (A)  Final    ENTEROCOCCUS FAECALIS SUSCEPTIBILITIES PERFORMED ON PREVIOUS CULTURE WITHIN THE LAST 5 DAYS. Performed at [facility], [place] [address], [place], [place] [address]    Report Status 12/14/2021 FINAL  Final  Culture, blood (Routine x 2)     Status: Abnormal   Collection Time: 12/11/21  9:13 PM   Specimen: BLOOD  Result Value Ref Range Status   Specimen Description   Final    BLOOD RW Performed at [facility], [address] [place], [place] [address]    Special Requests   Final    BOTTLES DRAWN AEROBIC AND ANAEROBIC BCAV Performed at [facility], [address] [place], [place] [address]    Culture  Setup Time   Final    GRAM POSITIVE COCCI IN BOTH AEROBIC AND ANAEROBIC BOTTLES CRITICAL RESULT CALLED TO, READ BACK BY AND VERIFIED WITH: MORGAN HICKS 12/12/21 1205 MW    Culture ENTEROCOCCUS FAECALIS (A)  Final   Report Status 12/14/2021 FINAL  Final   Organism ID, Bacteria ENTEROCOCCUS FAECALIS  Final      Susceptibility   Enterococcus faecalis - MIC*    AMPICILLIN <=2 SENSITIVE Sensitive     VANCOMYCIN 1 SENSITIVE Sensitive     GENTAMICIN SYNERGY SENSITIVE Sensitive     * ENTEROCOCCUS FAECALIS  Blood Culture ID Panel (Reflexed)     Status: Abnormal   Collection Time: 12/11/21  9:13 PM  Result Value Ref Range Status   Enterococcus faecalis DETECTED (A) NOT DETECTED Final    Comment: CRITICAL RESULT CALLED TO, READ BACK BY AND VERIFIED WITH: MORGAN HICKS 12/12/21 1206 MW    Enterococcus Faecium NOT DETECTED NOT DETECTED Final   Listeria monocytogenes NOT DETECTED NOT DETECTED Final   Staphylococcus species NOT DETECTED NOT DETECTED Final   Staphylococcus aureus (BCID) NOT DETECTED NOT DETECTED Final   Staphylococcus epidermidis NOT DETECTED NOT DETECTED Final   Staphylococcus  lugdunensis NOT DETECTED NOT DETECTED Final   Streptococcus species NOT DETECTED NOT DETECTED Final   Streptococcus agalactiae NOT DETECTED NOT DETECTED Final   Streptococcus pneumoniae NOT DETECTED NOT DETECTED Final   Streptococcus pyogenes NOT DETECTED NOT DETECTED Final  A.calcoaceticus-baumannii NOT DETECTED NOT DETECTED Final   Bacteroides fragilis NOT DETECTED NOT DETECTED Final   Enterobacterales NOT DETECTED NOT DETECTED Final   Enterobacter cloacae complex NOT DETECTED NOT DETECTED Final   Escherichia coli NOT DETECTED NOT DETECTED Final   Klebsiella aerogenes NOT DETECTED NOT DETECTED Final   Klebsiella oxytoca NOT DETECTED NOT DETECTED Final   Klebsiella pneumoniae NOT DETECTED NOT DETECTED Final   Proteus species NOT DETECTED NOT DETECTED Final   Salmonella species NOT DETECTED NOT DETECTED Final   Serratia marcescens NOT DETECTED NOT DETECTED Final   Haemophilus influenzae NOT DETECTED NOT DETECTED Final   Neisseria meningitidis NOT DETECTED NOT DETECTED Final   Pseudomonas aeruginosa NOT DETECTED NOT DETECTED Final   Stenotrophomonas maltophilia NOT DETECTED NOT DETECTED Final   Candida albicans NOT DETECTED NOT DETECTED Final   Candida auris NOT DETECTED NOT DETECTED Final   Candida glabrata NOT DETECTED NOT DETECTED Final   Candida krusei NOT DETECTED NOT DETECTED Final   Candida parapsilosis NOT DETECTED NOT DETECTED Final   Candida tropicalis NOT DETECTED NOT DETECTED Final   Cryptococcus neoformans/gattii NOT DETECTED NOT DETECTED Final   Vancomycin resistance NOT DETECTED NOT DETECTED Final    Comment: Performed at [facility], [address] [place], [place] [address]  Resp Panel by RT-PCR (Flu A&B, Covid) Nasopharyngeal Swab     Status: None   Collection Time: 12/11/21  9:14 PM   Specimen: Nasopharyngeal Swab; Nasopharyngeal(NP) swabs in vial transport medium  Result Value Ref Range Status   SARS Coronavirus 2 by RT PCR NEGATIVE NEGATIVE Final     Comment: (NOTE) SARS-CoV-2 target nucleic acids are NOT DETECTED.  The SARS-CoV-2 RNA is generally detectable in upper respiratory specimens during the acute phase of infection. The lowest concentration of SARS-CoV-2 viral copies this assay can detect is 138 copies/mL. A negative result does not preclude SARS-Cov-2 infection and should not be used as the sole basis for treatment or other patient management decisions. A negative result may occur with  improper specimen collection/handling, submission of specimen other than nasopharyngeal swab, presence of viral mutation(s) within the areas targeted by this assay, and inadequate number of viral copies(<138 copies/mL). A negative result must be combined with clinical observations, patient history, and epidemiological information. The expected result is Negative.  Fact Sheet for Patients:  EntrepreneurPulse.com.au  Fact Sheet for Healthcare Providers:  IncredibleEmployment.be  This test is no t yet approved or cleared by the [place] FDA and  has been authorized for detection and/or diagnosis of SARS-CoV-2 by FDA under an Emergency Use Authorization (EUA). This EUA will remain  in effect (meaning this test can be used) for the duration of the COVID-19 declaration under Section 564(b)(1) of the Act, 21 U.S.C.section 360bbb-3(b)(1), unless the authorization is terminated  or revoked sooner.       Influenza A by PCR NEGATIVE NEGATIVE Final   Influenza B by PCR NEGATIVE NEGATIVE Final    Comment: (NOTE) The Xpert Xpress SARS-CoV-2/FLU/RSV plus assay is intended as an aid in the diagnosis of influenza from Nasopharyngeal swab specimens and should not be used as a sole basis for treatment. Nasal washings and aspirates are unacceptable for Xpert Xpress SARS-CoV-2/FLU/RSV testing.  Fact Sheet for Patients: EntrepreneurPulse.com.au  Fact Sheet for Healthcare  Providers: IncredibleEmployment.be  This test is not yet approved or cleared by the [place] FDA and has been authorized for detection and/or diagnosis of SARS-CoV-2 by FDA under an Emergency Use Authorization (EUA). This EUA will remain  in effect (meaning this test can be used) for the duration of the COVID-19 declaration under Section 564(b)(1) of the Act, 21 U.S.C. section 360bbb-3(b)(1), unless the authorization is terminated or revoked.  Performed at [facility], [address], [place], [place] [address]   Urine Culture     Status: Abnormal   Collection Time: 12/11/21 10:27 PM   Specimen: Urine, Random  Result Value Ref Range Status   Specimen Description   Final    URINE, RANDOM Performed at [facility], [address] [place], [place] [address]    Special Requests   Final    NONE Performed at [facility], [address] [place], [place] [address]    Culture >=100,000 COLONIES/mL ENTEROCOCCUS FAECALIS (A)  Final   Report Status 12/14/2021 FINAL  Final   Organism ID, Bacteria ENTEROCOCCUS FAECALIS (A)  Final      Susceptibility   Enterococcus faecalis - MIC*    AMPICILLIN <=2 SENSITIVE Sensitive     NITROFURANTOIN <=16 SENSITIVE Sensitive     VANCOMYCIN 1 SENSITIVE Sensitive     * >=100,000 COLONIES/mL ENTEROCOCCUS FAECALIS  CULTURE, BLOOD (ROUTINE X 2) w Reflex to ID Panel     Status: None (Preliminary result)   Collection Time: 12/14/21  4:50 AM   Specimen: BLOOD  Result Value Ref Range Status   Specimen Description BLOOD BLOOD RIGHT WRIST  Final   Special Requests   Final    BOTTLES DRAWN AEROBIC AND ANAEROBIC Blood Culture adequate volume   Culture   Final    NO GROWTH 4 DAYS Performed at [facility], [address], [place], [place] [address]    Report Status PENDING  Incomplete  CULTURE, BLOOD (ROUTINE X 2) w Reflex to ID Panel     Status: None (Preliminary result)   Collection Time:  12/14/21  4:50 AM   Specimen: BLOOD  Result Value Ref Range Status   Specimen Description BLOOD BLOOD RIGHT HAND  Final   Special Requests   Final    BOTTLES DRAWN AEROBIC AND ANAEROBIC Blood Culture adequate volume   Culture   Final    NO GROWTH 4 DAYS Performed at [facility], [address] [place],  [address]    Report Status PENDING  Incomplete         Radiology Studies: No results found.      Scheduled Meds:  aspirin EC  81 mg Oral Daily   citalopram  40 mg Oral Daily   darifenacin  7.5 mg Oral Daily   diltiazem  180 mg Oral Daily   metoprolol tartrate  25 mg Oral BID   multivitamin with minerals  1 tablet Oral Daily   pantoprazole  40 mg Oral Daily   Continuous Infusions:  ampicillin (OMNIPEN) IV 2 g (12/18/21 0521)   lactated ringers 100 mL/hr at 12/18/21 1105     LOS: 6 days    Time spent: 35 minutes    Sidney Ace, MD [facility]   If 7PM-7AM, please contact night-coverage  12/18/2021, 11:44 AM

## 2021-12-18 NOTE — TOC Progression Note (Signed)
Transition of Care [name]) - Progression Note    Patient Details  Name: Luis Strong MRN: [id number] Date of Birth: [birth year]  Transition of Care [facility]) CM/SW [place], [place] Phone Number: 12/18/2021, 2:26 PM  Clinical Narrative:     CSW notes patient continues to express desire to go home when medically ready to discharge with home health through [facility].   TOC will continue to follow.    Expected Discharge Plan: [place] Barriers to Discharge: Continued Medical Work up  Expected Discharge Plan and Services Expected Discharge Plan: [place]   Discharge Planning Services: CM Consult Post Acute Care Choice: [place] arrangements for the past 2 months: Single Family Home                 DME Arranged: N/A DME Agency: NA       HH Arranged: RN, PT, OT [place] Agency: [place] ([facility]) Date HH Agency Contacted: 12/12/21 Time [place]: [id number] Representative spoke with at [place]: [place] ([place]) Interventions    Readmission Risk Interventions No flowsheet data found.

## 2021-12-18 NOTE — Progress Notes (Signed)
° °  Date of Admission:  12/11/2021     ID: Luis Strong is a 76 y.o. male  Principal Problem:   Severe sepsis ([place]) Active Problems:   HTN (hypertension)   OSA on CPAP   Complicated UTI (urinary tract infection)   Abnormal LFTs   Acute kidney injury superimposed on CKD lllb (HCC)   History of GI bleed   History of peptic ulcer   S/P cystoscopy with ureteral stent placement   High anion gap metabolic acidosis   Subjective: Pt doing much better Sitting in chair No cough or SOB No pain Appetite better  Medications:   aspirin EC  81 mg Oral Daily   citalopram  40 mg Oral Daily   darifenacin  7.5 mg Oral Daily   diltiazem  180 mg Oral Daily   metoprolol tartrate  25 mg Oral BID   multivitamin with minerals  1 tablet Oral Daily   pantoprazole  40 mg Oral Daily    Objective: Vital signs in last 24 hours: Temp:  [97.2 F (36.2 C)-98.4 F (36.9 C)] 98.1 F (36.7 C) (01/30 0756) Pulse Rate:  [76-92] 92 (01/30 0756) Resp:  [16-18] 16 (01/30 0756) BP: (146-156)/(75-82) 146/76 (01/30 0756) SpO2:  [94 %-97 %] 96 % (01/30 1016) Weight:  [93.5 kg] 93.5 kg (01/30 0500)  PHYSICAL EXAM:  General: Alert, cooperative, no distress,  Lungs: Bilateral air entry.  Heart: Regular rate and rhythm, no murmur, rub or gallop. Abdomen: Soft, non-tender,not distended. Bowel sounds normal. No masses Extremities: bruising left knee Skin: No rashes or lesions. Or bruising Lymph: Cervical, supraclavicular normal. Neurologic: Grossly non-focal  Lab Results Recent Labs    12/17/21 0533 12/18/21 0544  WBC 10.4 10.6*  HGB 13.6 13.2  HCT 40.3 39.2  NA 138 139  K 4.2 4.0  CL 111 109  CO2 16* 21*  BUN 60* 53*  CREATININE 3.05* 2.91*   Liver Panel Recent Labs    12/17/21 0533 12/18/21 0544  PROT 5.8* 5.8*  ALBUMIN 2.4* 2.5*  AST 81* 70*  ALT 106* 92*  ALKPHOS 51 50  BILITOT 1.3* 1.0  Microbiology: 1/23 BC/UC- enterococcus fecalis 12/14/2021 repeat blood culture  NG   Assessment/Plan: Enterococcus bacteremia with sepsis secondary to complictaed UTI, following laser lithotripsy, stent placement rt side Pt is on IV ampicillin- will need until 12/25/21 Patient does not have any heart device or valve replacement and hence endocarditis is less likely No joint replacements  AKI on CKD- multifactorial- sepsis/rhabdomyolysis /pre renal improving    Severe rhabdomyolysis- due to fall and being on the floor --contributing to AKI- management as per primary team.  Is improving  transaminitis due to sepsis/rhabdo Improving Discussed the management with the patient  and hospitalist

## 2021-12-19 DIAGNOSIS — R7881 Bacteremia: Secondary | ICD-10-CM | POA: Diagnosis not present

## 2021-12-19 DIAGNOSIS — B952 Enterococcus as the cause of diseases classified elsewhere: Secondary | ICD-10-CM | POA: Diagnosis not present

## 2021-12-19 DIAGNOSIS — N390 Urinary tract infection, site not specified: Secondary | ICD-10-CM | POA: Diagnosis not present

## 2021-12-19 LAB — CULTURE, BLOOD (ROUTINE X 2)
Culture: NO GROWTH
Culture: NO GROWTH
Special Requests: ADEQUATE
Special Requests: ADEQUATE

## 2021-12-19 LAB — CBC WITH DIFFERENTIAL/PLATELET
Abs Immature Granulocytes: 0.37 10*3/uL — ABNORMAL HIGH (ref 0.00–0.07)
Basophils Absolute: 0.1 10*3/uL (ref 0.0–0.1)
Basophils Relative: 1 %
Eosinophils Absolute: 0.3 10*3/uL (ref 0.0–0.5)
Eosinophils Relative: 3 %
HCT: 41.9 % (ref 39.0–52.0)
Hemoglobin: 13.8 g/dL (ref 13.0–17.0)
Immature Granulocytes: 4 %
Lymphocytes Relative: 10 %
Lymphs Abs: 1 10*3/uL (ref 0.7–4.0)
MCH: 28.6 pg (ref 26.0–34.0)
MCHC: 32.9 g/dL (ref 30.0–36.0)
MCV: 86.9 fL (ref 80.0–100.0)
Monocytes Absolute: 0.8 10*3/uL (ref 0.1–1.0)
Monocytes Relative: 8 %
Neutro Abs: 7.9 10*3/uL — ABNORMAL HIGH (ref 1.7–7.7)
Neutrophils Relative %: 74 %
Platelets: 263 10*3/uL (ref 150–400)
RBC: 4.82 MIL/uL (ref 4.22–5.81)
RDW: 14.4 % (ref 11.5–15.5)
WBC: 10.6 10*3/uL — ABNORMAL HIGH (ref 4.0–10.5)
nRBC: 0 % (ref 0.0–0.2)

## 2021-12-19 LAB — COMPREHENSIVE METABOLIC PANEL
ALT: 74 U/L — ABNORMAL HIGH (ref 0–44)
AST: 54 U/L — ABNORMAL HIGH (ref 15–41)
Albumin: 2.6 g/dL — ABNORMAL LOW (ref 3.5–5.0)
Alkaline Phosphatase: 48 U/L (ref 38–126)
Anion gap: 11 (ref 5–15)
BUN: 44 mg/dL — ABNORMAL HIGH (ref 8–23)
CO2: 20 mmol/L — ABNORMAL LOW (ref 22–32)
Calcium: 8.3 mg/dL — ABNORMAL LOW (ref 8.9–10.3)
Chloride: 106 mmol/L (ref 98–111)
Creatinine, Ser: 2.68 mg/dL — ABNORMAL HIGH (ref 0.61–1.24)
GFR, Estimated: 24 mL/min — ABNORMAL LOW (ref >60)
Glucose, Bld: 98 mg/dL (ref 70–99)
Potassium: 3.8 mmol/L (ref 3.5–5.1)
Sodium: 137 mmol/L (ref 135–145)
Total Bilirubin: 1 mg/dL (ref 0.3–1.2)
Total Protein: 6 g/dL — ABNORMAL LOW (ref 6.5–8.1)

## 2021-12-19 LAB — CK: Total CK: 916 U/L — ABNORMAL HIGH (ref 49–397)

## 2021-12-19 MED ORDER — ENSURE ENLIVE PO LIQD
237.0000 mL | Freq: Three times a day (TID) | ORAL | Status: DC
  Administered 2021-12-19 – 2021-12-22 (×8): 237 mL via ORAL

## 2021-12-19 NOTE — Progress Notes (Addendum)
PROGRESS NOTE    Luis Strong  FXT:[id number] DOB: 27-Sep-1946 DOA: 12/11/2021 PCP: Juline Patch, MD    Brief Narrative:  76 year old M with PMH of CKD-3B, CAD, CVA, OSA on CPAP, HTN, GI bleed/esophageal ulcer, and nephrolithiasis s/p bilateral J stent on 12/05/2021 presenting with general malaise and fever, and admitted for severe sepsis due to complicated UTI.  Patient's son found him down on the floor and called EMS. CT renal stone study negative for hydronephrosis or obstruction, and the stents at right place.  Cultures obtained.  Patient was started on IV cefepime.  Urology consulted.   Blood culture with GPC.  Speciation Enterococcus faecalis.  Infectious disease involved.  Patient on IV ampicillin.  Clinically improving.   Assessment & Plan:   Principal Problem:   Severe sepsis ([place]) Active Problems:   HTN (hypertension)   OSA on CPAP   Complicated UTI (urinary tract infection)   Abnormal LFTs   Acute kidney injury superimposed on CKD lllb (HCC)   History of GI bleed   History of peptic ulcer   S/P cystoscopy with ureteral stent placement   High anion gap metabolic acidosis  - Severe sepsis due to bacteremia and complicated UTI and patient with nephrolithiasis s/p bilateral JJ stent on 12/05/2020 -Enterococcus faecalis bacteremia -Elevated liver function test secondary to sepsis  POA.  Had fever, tachycardia, tachypnea, AKI and lactic acidosis.  Culture data as above.  Lactic acidosis resolved.  Pro-Cal elevated to 7.92.  Leukocytosis improving. Kidney function improving Liver function improving Plan: Continue IV fluids, lactated Ringer's for additional 24 hours On IV ampicillin per ID recommendations, will need 14-day course Per ID may be able to transition to oral antibiotic, will need reassessment Monitor vitals and fever curve  Atrial fibrillation with rapid ventricular response New onset Required Cardizem gtt., now weaned off HR  controlled Plan: Continue cardizem CD 180 daily Continue home metoprolol Defer anticoagulation for now as likely A. fib was driven by underlying severe sepsis.  Can re-visit this at later date once kidney function improved   AKI on CKD-3B  Likely from rhabdomyolysis and sepsis.   CT renal stone study without hydronephrosis or obstruction Creatinine improving, still not at baseline Plan: Continue IV fluids lactated Ringer's at 100 cc/h for 24 hours Monitor UOP Daily renal function   Anion gap metabolic acidosis  Likely due to renal failure.  Improved. -Continue monitoring   Possible fall at home Rhabdomyolysis CK elevated to 31,000.  Could be traumatic.   He was found down by his patient's son He is also active, working out 6 times weekly CK down to 2700 on 1/27, continues to downtrend  Plan: LR as above Hold home statin Therapy evaluations Current recommendation for skilled nursing facility    Elevated liver enzymes/hyperbilirubinemia  Likely due to rhabdomyolysis.  RUQ [place] without acute finding.   Patient is s/p cholecystectomy.  No GI symptoms.  Improving. -On IV fluids as above -Daily CMP   Essential hypertension: Normotensive for most part. -Continue home metoprolol   OSA on CPAP -Continue nightly CPAP   History of GI bleed/esophageal ulcer -Continue home Protonix   Thrombocytopenia  Acute on chronic.  Likely due to sepsis. -Discontinue subcu Lovenox -SCD for VTE prophylaxis   DVT prophylaxis: SCD Code Status: Full Family Communication: Son at bedside 1/27, son TJ via phone [phone] on 1/31 Disposition Plan: Status is: Inpatient  Remains inpatient appropriate because: Severe sepsis, AKI, rhabdomyolysis secondary to enterococcal bacteremia and fall with time down.  Anticipate need for skilled nursing facility.  Patient expresses desire to go home if his clinical situation would allow   Level of care: Progressive  Consultants:  ID  Procedures:   None  Antimicrobials: Ampicillin   Subjective: Patient seen and examined.  No acute distress.  No acute events overnight  Objective: Vitals:   12/18/21 2233 12/18/21 2303 12/19/21 0536 12/19/21 1117  BP:  (!) 149/78 (!) 171/94 (!) 151/97  Pulse: (!) 118 85 84 86  Resp:  18 18 18   Temp:  98.2 F (36.8 C) 98.1 F (36.7 C) 97.7 F (36.5 C)  TempSrc:      SpO2: 95% 98% 97% 97%  Weight:      Height:        Intake/Output Summary (Last 24 hours) at 12/19/2021 1331 Last data filed at 12/19/2021 1116 Gross per 24 hour  Intake 1891.26 ml  Output 2300 ml  Net -408.74 ml   Filed Weights   12/16/21 0500 12/17/21 0500 12/18/21 0500  Weight: 96.2 kg 96 kg 93.5 kg    Examination:  General exam: No acute distress Respiratory system: Lungs clear.  Normal work of breathing.  Room air Cardiovascular system: S1-S2, RRR, no murmurs, no pedal edema Gastrointestinal system: Soft, NT/ND, normal bowel sounds Central nervous system: Alert and oriented. No focal neurological deficits. Extremities: Weakness, improving over interval Skin: No rashes, lesions or ulcers Psychiatry: Judgement and insight appear normal. Mood & affect appropriate.     Data Reviewed: I have personally reviewed following labs and imaging studies  CBC: Recent Labs  Lab 12/15/21 0541 12/16/21 0511 12/17/21 0533 12/18/21 0544 12/19/21 0704  WBC 9.9 9.0 10.4 10.6* 10.6*  NEUTROABS 8.2* 7.0 7.9* 7.8* 7.9*  HGB 13.8 13.2 13.6 13.2 13.8  HCT 41.9 39.3 40.3 39.2 41.9  MCV 87.1 84.3 83.6 83.9 86.9  PLT 104* 135* 184 222 093   Basic Metabolic Panel: Recent Labs  Lab 12/13/21 0629 12/14/21 0450 12/15/21 0541 12/16/21 0511 12/17/21 0533 12/18/21 0544 12/19/21 0704  NA 133*   < > 136 136 138 139 137  K 4.1   < > 3.9 4.1 4.2 4.0 3.8  CL 104   < > 108 110 111 109 106  CO2 18*   < > 20* 16* 16* 21* 20*  GLUCOSE 109*   < > 101* 100* 99 112* 98  BUN 75*   < > 77* 66* 60* 53* 44*  CREATININE 4.38*   < >  3.60* 3.14* 3.05* 2.91* 2.68*  CALCIUM 7.7*   < > 7.7* 7.9* 8.1* 8.2* 8.3*  MG 2.5*  --   --   --   --   --   --   PHOS 4.5  --   --   --   --   --   --    < > = values in this interval not displayed.   GFR: Estimated Creatinine Clearance: 26.5 mL/min (A) (by C-G formula based on SCr of 2.68 mg/dL (H)). Liver Function Tests: Recent Labs  Lab 12/15/21 0541 12/16/21 0511 12/17/21 0533 12/18/21 0544 12/19/21 0704  AST 166* 111* 81* 70* 54*  ALT 168* 133* 106* 92* 74*  ALKPHOS 45 52 51 50 48  BILITOT 1.2 1.2 1.3* 1.0 1.0  PROT 5.7* 5.7* 5.8* 5.8* 6.0*  ALBUMIN 2.6* 2.6* 2.4* 2.5* 2.6*   No results for input(s): LIPASE, AMYLASE in the last 168 hours. Recent Labs  Lab 12/12/21 1655  AMMONIA 28   Coagulation Profile: No  results for input(s): INR, PROTIME in the last 168 hours.  Cardiac Enzymes: Recent Labs  Lab 12/15/21 0541 12/16/21 0511 12/17/21 0533 12/18/21 0544 12/19/21 0704  CKTOTAL 2,716* 2,003* 1,979* 1,741* 916*   BNP (last 3 results) No results for input(s): PROBNP in the last 8760 hours. HbA1C: No results for input(s): HGBA1C in the last 72 hours. CBG: No results for input(s): GLUCAP in the last 168 hours. Lipid Profile: No results for input(s): CHOL, HDL, LDLCALC, TRIG, CHOLHDL, LDLDIRECT in the last 72 hours. Thyroid Function Tests: No results for input(s): TSH, T4TOTAL, FREET4, T3FREE, THYROIDAB in the last 72 hours.  Anemia Panel: No results for input(s): VITAMINB12, FOLATE, FERRITIN, TIBC, IRON, RETICCTPCT in the last 72 hours. Sepsis Labs: No results for input(s): PROCALCITON, LATICACIDVEN in the last 168 hours.   Recent Results (from the past 240 hour(s))  Culture, blood (Routine x 2)     Status: Abnormal   Collection Time: 12/11/21  9:13 PM   Specimen: BLOOD  Result Value Ref Range Status   Specimen Description   Final    BLOOD LEFT FOREARM Performed at [facility], [address] [place], [place] [address]    Special  Requests   Final    BOTTLES DRAWN AEROBIC AND ANAEROBIC Blood Culture adequate volume Performed at [address] [place], [place] [address]    Culture  Setup Time   Final    GRAM POSITIVE COCCI IN BOTH AEROBIC AND ANAEROBIC BOTTLES CRITICAL RESULT CALLED TO, READ BACK BY AND VERIFIED WITH: MORGAN HICKS 12/12/21 1205 MW    Culture (A)  Final    ENTEROCOCCUS FAECALIS SUSCEPTIBILITIES PERFORMED ON PREVIOUS CULTURE WITHIN THE LAST 5 DAYS. Performed at [facility], [place] [address], [place], [place] [address]    Report Status 12/14/2021 FINAL  Final  Culture, blood (Routine x 2)     Status: Abnormal   Collection Time: 12/11/21  9:13 PM   Specimen: BLOOD  Result Value Ref Range Status   Specimen Description   Final    BLOOD RW Performed at [facility], [address] [place], [place] [address]    Special Requests   Final    BOTTLES DRAWN AEROBIC AND ANAEROBIC BCAV Performed at [facility], [address] [place],  [address]    Culture  Setup Time   Final    GRAM POSITIVE COCCI IN BOTH AEROBIC AND ANAEROBIC BOTTLES CRITICAL RESULT CALLED TO, READ BACK BY AND VERIFIED WITH: MORGAN HICKS 12/12/21 1205 MW    Culture ENTEROCOCCUS FAECALIS (A)  Final   Report Status 12/14/2021 FINAL  Final   Organism ID, Bacteria ENTEROCOCCUS FAECALIS  Final      Susceptibility   Enterococcus faecalis - MIC*    AMPICILLIN <=2 SENSITIVE Sensitive     VANCOMYCIN 1 SENSITIVE Sensitive     GENTAMICIN SYNERGY SENSITIVE Sensitive     * ENTEROCOCCUS FAECALIS  Blood Culture ID Panel (Reflexed)     Status: Abnormal   Collection Time: 12/11/21  9:13 PM  Result Value Ref Range Status   Enterococcus faecalis DETECTED (A) NOT DETECTED Final    Comment: CRITICAL RESULT CALLED TO, READ BACK BY AND VERIFIED WITH: MORGAN HICKS 12/12/21 1206 MW    Enterococcus Faecium NOT DETECTED NOT DETECTED Final   Listeria monocytogenes NOT DETECTED NOT DETECTED Final    Staphylococcus species NOT DETECTED NOT DETECTED Final   Staphylococcus aureus (BCID) NOT DETECTED NOT DETECTED Final   Staphylococcus epidermidis  NOT DETECTED NOT DETECTED Final   Staphylococcus lugdunensis NOT DETECTED NOT DETECTED Final   Streptococcus species NOT DETECTED NOT DETECTED Final   Streptococcus agalactiae NOT DETECTED NOT DETECTED Final   Streptococcus pneumoniae NOT DETECTED NOT DETECTED Final   Streptococcus pyogenes NOT DETECTED NOT DETECTED Final   A.calcoaceticus-baumannii NOT DETECTED NOT DETECTED Final   Bacteroides fragilis NOT DETECTED NOT DETECTED Final   Enterobacterales NOT DETECTED NOT DETECTED Final   Enterobacter cloacae complex NOT DETECTED NOT DETECTED Final   Escherichia coli NOT DETECTED NOT DETECTED Final   Klebsiella aerogenes NOT DETECTED NOT DETECTED Final   Klebsiella oxytoca NOT DETECTED NOT DETECTED Final   Klebsiella pneumoniae NOT DETECTED NOT DETECTED Final   Proteus species NOT DETECTED NOT DETECTED Final   Salmonella species NOT DETECTED NOT DETECTED Final   Serratia marcescens NOT DETECTED NOT DETECTED Final   Haemophilus influenzae NOT DETECTED NOT DETECTED Final   Neisseria meningitidis NOT DETECTED NOT DETECTED Final   Pseudomonas aeruginosa NOT DETECTED NOT DETECTED Final   Stenotrophomonas maltophilia NOT DETECTED NOT DETECTED Final   Candida albicans NOT DETECTED NOT DETECTED Final   Candida auris NOT DETECTED NOT DETECTED Final   Candida glabrata NOT DETECTED NOT DETECTED Final   Candida krusei NOT DETECTED NOT DETECTED Final   Candida parapsilosis NOT DETECTED NOT DETECTED Final   Candida tropicalis NOT DETECTED NOT DETECTED Final   Cryptococcus neoformans/gattii NOT DETECTED NOT DETECTED Final   Vancomycin resistance NOT DETECTED NOT DETECTED Final    Comment: Performed at [facility], [address] [place], [place] [address]  Resp Panel by RT-PCR (Flu A&B, Covid) Nasopharyngeal Swab     Status: None    Collection Time: 12/11/21  9:14 PM   Specimen: Nasopharyngeal Swab; Nasopharyngeal(NP) swabs in vial transport medium  Result Value Ref Range Status   SARS Coronavirus 2 by RT PCR NEGATIVE NEGATIVE Final    Comment: (NOTE) SARS-CoV-2 target nucleic acids are NOT DETECTED.  The SARS-CoV-2 RNA is generally detectable in upper respiratory specimens during the acute phase of infection. The lowest concentration of SARS-CoV-2 viral copies this assay can detect is 138 copies/mL. A negative result does not preclude SARS-Cov-2 infection and should not be used as the sole basis for treatment or other patient management decisions. A negative result may occur with  improper specimen collection/handling, submission of specimen other than nasopharyngeal swab, presence of viral mutation(s) within the areas targeted by this assay, and inadequate number of viral copies(<138 copies/mL). A negative result must be combined with clinical observations, patient history, and epidemiological information. The expected result is Negative.  Fact Sheet for Patients:  EntrepreneurPulse.com.au  Fact Sheet for Healthcare Providers:  IncredibleEmployment.be  This test is no t yet approved or cleared by the [place] FDA and  has been authorized for detection and/or diagnosis of SARS-CoV-2 by FDA under an Emergency Use Authorization (EUA). This EUA will remain  in effect (meaning this test can be used) for the duration of the COVID-19 declaration under Section 564(b)(1) of the Act, 21 U.S.C.section 360bbb-3(b)(1), unless the authorization is terminated  or revoked sooner.       Influenza A by PCR NEGATIVE NEGATIVE Final   Influenza B by PCR NEGATIVE NEGATIVE Final    Comment: (NOTE) The Xpert Xpress SARS-CoV-2/FLU/RSV plus assay is intended as an aid in the diagnosis of influenza from Nasopharyngeal swab specimens and should not be used as a sole basis for treatment.  Nasal washings and aspirates are unacceptable for Xpert  Xpress SARS-CoV-2/FLU/RSV testing.  Fact Sheet for Patients: EntrepreneurPulse.com.au  Fact Sheet for Healthcare Providers: IncredibleEmployment.be  This test is not yet approved or cleared by the [place] FDA and has been authorized for detection and/or diagnosis of SARS-CoV-2 by FDA under an Emergency Use Authorization (EUA). This EUA will remain in effect (meaning this test can be used) for the duration of the COVID-19 declaration under Section 564(b)(1) of the Act, 21 U.S.C. section 360bbb-3(b)(1), unless the authorization is terminated or revoked.  Performed at [facility], [address], [place], [place] [address]   Urine Culture     Status: Abnormal   Collection Time: 12/11/21 10:27 PM   Specimen: Urine, Random  Result Value Ref Range Status   Specimen Description   Final    URINE, RANDOM Performed at [facility], [address] [place], [place] [address]    Special Requests   Final    NONE Performed at [facility], [address] [place], [place] [address]    Culture >=100,000 COLONIES/mL ENTEROCOCCUS FAECALIS (A)  Final   Report Status 12/14/2021 FINAL  Final   Organism ID, Bacteria ENTEROCOCCUS FAECALIS (A)  Final      Susceptibility   Enterococcus faecalis - MIC*    AMPICILLIN <=2 SENSITIVE Sensitive     NITROFURANTOIN <=16 SENSITIVE Sensitive     VANCOMYCIN 1 SENSITIVE Sensitive     * >=100,000 COLONIES/mL ENTEROCOCCUS FAECALIS  CULTURE, BLOOD (ROUTINE X 2) w Reflex to ID Panel     Status: None   Collection Time: 12/14/21  4:50 AM   Specimen: BLOOD  Result Value Ref Range Status   Specimen Description BLOOD BLOOD RIGHT WRIST  Final   Special Requests   Final    BOTTLES DRAWN AEROBIC AND ANAEROBIC Blood Culture adequate volume   Culture   Final    NO GROWTH 5 DAYS Performed at [facility], [address]  [place], [place] [address]    Report Status 12/19/2021 FINAL  Final  CULTURE, BLOOD (ROUTINE X 2) w Reflex to ID Panel     Status: None   Collection Time: 12/14/21  4:50 AM   Specimen: BLOOD  Result Value Ref Range Status   Specimen Description BLOOD BLOOD RIGHT HAND  Final   Special Requests   Final    BOTTLES DRAWN AEROBIC AND ANAEROBIC Blood Culture adequate volume   Culture   Final    NO GROWTH 5 DAYS Performed at [facility], [address] [place], [place] [address]    Report Status 12/19/2021 FINAL  Final         Radiology Studies: No results found.      Scheduled Meds:  aspirin EC  81 mg Oral Daily   citalopram  40 mg Oral Daily   darifenacin  7.5 mg Oral Daily   diltiazem  180 mg Oral Daily   metoprolol tartrate  25 mg Oral BID   multivitamin with minerals  1 tablet Oral Daily   pantoprazole  40 mg Oral Daily   Continuous Infusions:  ampicillin (OMNIPEN) IV 2 g (12/19/21 0618)   lactated ringers 100 mL/hr at 12/19/21 1048     LOS: 7 days    Time spent: 35 minutes    Sidney Ace, MD [facility]   If 7PM-7AM, please contact night-coverage  12/19/2021, 1:31 PM

## 2021-12-19 NOTE — Progress Notes (Signed)
Occupational Therapy Treatment Patient Details Name: Luis Strong MRN: [id number] DOB: 10-Sep-1946 Today's Date: 12/19/2021   History of present illness Luis Strong is a 76 y.o. male with medical history significant for CKD 3B, HTN, CAD, CVA, OSA on CPAP, history of GI bleed 2020 with nonbleeding esophageal ulcer on EGD, nephrolithiasis s/p ureteroscopy with bilateral J stent placement on 1/17, who presents to the ED with generalized malaise and fever starting 2 days after his procedure.  He denies abdominal pain, nausea or vomiting and denies diarrhea.  Has no cough, shortness of breath or chest pain.   OT comments  Pt seen for OT treatment on this date. Upon arrival to room, pt awake and seated upright in bed with RN present. Pt demonstrating improved cognition since previous OT sessions; pt was alert and oriented x4 and was able to recall working with PT earlier in day (unable to recall yesterday). Pt agreeable to OT tx. Pt required only SUPERVISION for bed mobility. During sit>stand transfer from bed, pt required no physical assist to clear hips from bed, but required MIN A to steady in standing position. Of note, pt initially attempted to steady himself by pulling on handle of cabinet drawer. During transfer to/from recliner and toilet and during functional mobility of short household distances, pt required only MIN GUARD.  To further assess pt's cognition, pt completed the Pill Box Test this date. The Pill Box test assesses a person's ability to accurately follow common medication bottle instructions to fill a 1 week pill box. It assesses a pt's initiation, planning, organization, working memory, and problem solving skills. Pt was unable to complete the test in twice the time allotted and was observed to accidentally place 4 pills on the wrong date/time. When cued that pt made a mistake, pt verbalized understanding, but was unable to recall what mistake he made a few minutes later. The  results of this assessment indicate that pt presents with decreased working memory and awareness of deficits. Pt was educated on the importance of having family assist with IADLs, such as medication managing and cooking, to maximize safety upon discharge home. Pt verbalized understanding and stated that his son will be able to assist PRN.  This Pryor Curia called pt's son (TJ) following OT tx to discuss pt's progress and current functional impairments. Pt's son reported that he will not be able to provide constant supervision if pt was to return home with home health. Pt's son also expressed concern that one of pt's medications is causing cognitive changes and dry mouth; MD informed. Due to pt's current balance and cognitive deficits, continue to recommend SNF. Will continue to follow POC.    Recommendations for follow up therapy are one component of a multi-disciplinary discharge planning process, led by the attending physician.  Recommendations may be updated based on patient status, additional functional criteria and insurance authorization.    Follow Up Recommendations  Skilled nursing-short term rehab (<3 hours/day)    Assistance Recommended at Discharge Frequent or constant Supervision/Assistance  Patient can return home with the following  A little help with walking and/or transfers;A little help with bathing/dressing/bathroom;Direct supervision/assist for medications management;Direct supervision/assist for financial management;Assist for transportation;Help with stairs or ramp for entrance;Assistance with cooking/housework   Equipment Recommendations  Other (comment) (defer to next venue of care)       Precautions / Restrictions Precautions Precautions: Fall Restrictions Weight Bearing Restrictions: No       Mobility Bed Mobility Overal bed mobility: Needs Assistance Bed Mobility:  Supine to Sit     Supine to sit: Supervision     General bed mobility comments: With HOB flat, pt  requires only SUPERVISION for bed mobility    Transfers Overall transfer level: Needs assistance Equipment used: None Transfers: Sit to/from Stand Sit to Stand: Min assist, Min guard           General transfer comment: During sit>stand attempt from bed, pt required no physical assist to clear hips from bed, but required MIN A to steady in standing position as pt initially attempted to steady himself by pulling on handle of cabinet drawer. During transfer to/from recliner and toilet, pt required only MIN GUARD     Balance Overall balance assessment: Needs assistance Sitting-balance support: No upper extremity supported, Feet supported Sitting balance-Leahy Scale: Good     Standing balance support: No upper extremity supported, During functional activity Standing balance-Leahy Scale: Poor Standing balance comment: Min A initially for steadying progressing to Min guard for walking around room                           ADL either performed or assessed with clinical judgement   ADL Overall ADL's : Needs assistance/impaired                         Toilet Transfer: Min guard;Regular Toilet;Grab bars           Functional mobility during ADLs: Min guard (MIN GUARD when walking around room without AD)        Cognition Arousal/Alertness: Awake/alert Behavior During Therapy: WFL for tasks assessed/performed Overall Cognitive Status: Impaired/Different from baseline                                 General Comments: Pt A&Ox4, although presents with decreased short term memory              General Comments patient fatigued with activity overall    Pertinent Vitals/ Pain       Pain Assessment Pain Assessment: No/denies pain         Frequency  Min 3X/week        Progress Toward Goals  OT Goals(current goals can now be found in the care plan section)  Progress towards OT goals: Progressing toward goals  Acute Rehab OT  Goals Patient Stated Goal: to go home and be independent OT Goal Formulation: With patient Time For Goal Achievement: 12/26/21 Potential to Achieve Goals: Good  Plan Frequency remains appropriate;Discharge plan remains appropriate       AM-PAC OT "6 Clicks" Daily Activity     Outcome Measure   Help from another person eating meals?: None Help from another person taking care of personal grooming?: A Little Help from another person toileting, which includes using toliet, bedpan, or urinal?: A Little Help from another person bathing (including washing, rinsing, drying)?: A Lot Help from another person to put on and taking off regular upper body clothing?: A Little Help from another person to put on and taking off regular lower body clothing?: A Little 6 Click Score: 18    End of Session Equipment Utilized During Treatment: Gait belt  OT Visit Diagnosis: Unsteadiness on feet (R26.81);History of falling (Z91.81)   Activity Tolerance Patient tolerated treatment well   Patient Left in chair;with call bell/phone within reach;with chair alarm set   Nurse Communication Mobility  status        Time: 1430-1500 OT Time Calculation (min): 30 min  Charges: OT General Charges $OT Visit: 1 Visit OT Treatments $Self Care/Home Management : 23-37 mins  Fredirick Maudlin, OTR/L [place]

## 2021-12-19 NOTE — Plan of Care (Signed)

## 2021-12-19 NOTE — Progress Notes (Signed)
° °  Date of Admission:  12/11/2021     ID: Luis Strong is a 76 y.o. male  Principal Problem:   Severe sepsis ([place]) Active Problems:   HTN (hypertension)   OSA on CPAP   Complicated UTI (urinary tract infection)   Abnormal LFTs   Acute kidney injury superimposed on CKD lllb (HCC)   History of GI bleed   History of peptic ulcer   S/P cystoscopy with ureteral stent placement   High anion gap metabolic acidosis   Subjective: Doing better No fever No pain Appetite good. Not worked much with PT  Medications:   aspirin EC  81 mg Oral Daily   citalopram  40 mg Oral Daily   diltiazem  180 mg Oral Daily   feeding supplement  237 mL Oral TID BM   metoprolol tartrate  25 mg Oral BID   multivitamin with minerals  1 tablet Oral Daily   pantoprazole  40 mg Oral Daily    Objective: Vital signs in last 24 hours: Temp:  [97.5 F (36.4 C)-98.2 F (36.8 C)] 97.7 F (36.5 C) (01/31 1505) Pulse Rate:  [69-118] 69 (01/31 1505) Resp:  [18] 18 (01/31 1505) BP: (132-171)/(77-97) 132/77 (01/31 1505) SpO2:  [95 %-99 %] 99 % (01/31 1505)  PHYSICAL EXAM:  General: looks well, no distress Lungs: Bilateral air entry.  Heart: Regular rate and rhythm, no murmur, rub or gallop. Abdomen: Soft, non-tender,not distended. Bowel sounds normal. No masses Skin: No rashes or lesions. Or bruising Lymph: Cervical, supraclavicular normal. Neurologic: Grossly non-focal  Lab Results Recent Labs    12/18/21 0544 12/19/21 0704  WBC 10.6* 10.6*  HGB 13.2 13.8  HCT 39.2 41.9  NA 139 137  K 4.0 3.8  CL 109 106  CO2 21* 20*  BUN 53* 44*  CREATININE 2.91* 2.68*   Liver Panel Recent Labs    12/18/21 0544 12/19/21 0704  PROT 5.8* 6.0*  ALBUMIN 2.5* 2.6*  AST 70* 54*  ALT 92* 74*  ALKPHOS 50 48  BILITOT 1.0 1.0  Microbiology: 1/23 BC/UC- enterococcus fecalis 12/14/2021 repeat blood culture Strong   Assessment/Plan: Enterococcus bacteremia with sepsis secondary to complictaed UTI, following  laser lithotripsy, stent placement rt side Repeat blood culture No growth Pt is on IV ampicillin-day 8 will need until 12/25/21 Patient does not have any heart device or valve replacement and hence endocarditis is less likely No joint replacements  AKI on CKD- Improving multifactorial-  sepsis/rhabdomyolysis /pre renal    Severe rhabdomyolysis- due to fall and being on the floor --contributing to AKI- management as per primary team.  Is improving  transaminitis due to sepsis/rhabdo Improving Discussed the management with the patient  and hospitalist

## 2021-12-19 NOTE — Progress Notes (Signed)
Nutrition Follow-up  DOCUMENTATION CODES:  Obesity unspecified  INTERVENTION:  Add Ensure Plus High Protein po TID, each supplement provides 350 kcal and 20 grams of protein.   Continue MVI with minerals daily.  Continue to encourage PO and supplement intake.  NUTRITION DIAGNOSIS:  Increased nutrient needs related to acute illness (severe sepsis 2/2 UTI) as evidenced by estimated needs. - ongoing  GOAL:  Patient will meet greater than or equal to 90% of their needs. - not meeting  MONITOR:  PO intake, Supplement acceptance, Labs, Weight trends, Skin, I & O's  REASON FOR ASSESSMENT:  Malnutrition Screening Tool    ASSESSMENT:  76 yo male with a PMH of CKD stage 3b, CAD, CVA, OSA on CPAP, HTN, GI bleed/esophageal ulcer, and nephrolithiasis s/p bilateral J stent on 12/05/2021 presenting with general malaise and fever, and admitted for severe sepsis due to complicated UTI.  Pt still very ill. Plan for SNF at discharge, but patient is adamant to go home.  Spoke with pt at bedside. Pt reports his appetite is improving but per meal documentation, this does not seem to be the case. He reports being eager to going home soon.  Pt requested Ensure supplements. RD to re-add these.  Per Epic, pt with limited meal documentation. However, pt only appears to be eating 0-50% of most documented meals. Pt did eat 95% of breakfast on 1/29, however.   Admit wt: 95.3 kg Current wt: 93.5 kg  Medications: reviewed; MVI with minerals, Protonix, LR @ 100 ml/hr  Labs: reviewed  Diet Order:   Diet Order             Diet Heart Room service appropriate? Yes; Fluid consistency: Thin  Diet effective now                  EDUCATION NEEDS:  Education needs have been addressed  Skin:  Skin Assessment: Skin Integrity Issues: Skin Integrity Issues:: Incisions Incisions: Penis, closed (1/17)  Last BM:  12/17/21 - Type 4, medium  Height:  Ht Readings from Last 1 Encounters:  12/11/21 5\' 9"   (1.753 m)   Weight:  Wt Readings from Last 1 Encounters:  12/18/21 93.5 kg   BMI:  Body mass index is 30.44 kg/m.  Estimated Nutritional Needs:  Kcal:  2100-2300 Protein:  115-130 grams Fluid:  >2.1 L  Derrel Nip, RD, LDN (she/her/hers) Clinical Inpatient Dietitian RD Pager/After-Hours/Weekend Pager # in [place]

## 2021-12-19 NOTE — Progress Notes (Signed)
Physical Therapy Treatment Patient Details Name: Luis Strong MRN: [id number] DOB: 1946-07-09 Today's Date: 12/19/2021   History of Present Illness Luis Strong is a 76 y.o. male with medical history significant for CKD 3B, HTN, CAD, CVA, OSA on CPAP, history of GI bleed 2020 with nonbleeding esophageal ulcer on EGD, nephrolithiasis s/p ureteroscopy with bilateral J stent placement on 1/17, who presents to the ED with generalized malaise and fever starting 2 days after his procedure.  He denies abdominal pain, nausea or vomiting and denies diarrhea.  Has no cough, shortness of breath or chest pain.    PT Comments    Patient in bed on arrival to room. He was agreeable to PT but declined to ambulate today. He continues to have decreased awareness of physical limitations and need for assistance with mobility. He was able to follow all commands consistently. He required Min A for standing x 2 bouts with steadying assistance provided while using urinal. He was fatigued with activity and required occasional cues for safety with mobility. Recommend to continue PT to maximize independence and facilitate return to prior level of function. SNF recommended, however patient states he wants to return home. He will need intermittent family assistance for safety and fall prevention if he is going home.    Recommendations for follow up therapy are one component of a multi-disciplinary discharge planning process, led by the attending physician.  Recommendations may be updated based on patient status, additional functional criteria and insurance authorization.  Follow Up Recommendations  Skilled nursing-short term rehab (<3 hours/day)     Assistance Recommended at Discharge Intermittent Supervision/Assistance  Patient can return home with the following A little help with bathing/dressing/bathroom;Assistance with cooking/housework;Assist for transportation;Help with stairs or ramp for entrance;A little  help with walking and/or transfers;Direct supervision/assist for medications management   Equipment Recommendations  Rolling walker (2 wheels)    Recommendations for Other Services       Precautions / Restrictions Precautions Precautions: Fall Restrictions Weight Bearing Restrictions: No     Mobility  Bed Mobility Overal bed mobility: Needs Assistance Bed Mobility: Supine to Sit     Supine to sit: Supervision, HOB elevated Sit to supine: HOB elevated, Min assist   General bed mobility comments: increased assistance required for return to bed for LE support. increased time and effort with mobility    Transfers Overall transfer level: Needs assistance Equipment used: None Transfers: Sit to/from Stand Sit to Stand: Min assist           General transfer comment: steadying assistance provided with transfer from bed performed x 2 bouts. verbal cues for hand placement for safety and occasional cues for safety provided    Ambulation/Gait               General Gait Details: patient declined walking this date. he also declined getting up to the chair. recommend routine OOB to chair mobility with staff assistance for upright conditioning   Stairs             Wheelchair Mobility    Modified Rankin (Stroke Patients Only)       Balance Overall balance assessment: Needs assistance Sitting-balance support: No upper extremity supported, Feet supported Sitting balance-Leahy Scale: Good     Standing balance support: No upper extremity supported Standing balance-Leahy Scale: Poor Standing balance comment: Min A initially for steadying progressing to Min guard for standing to use urinal.  Cognition Arousal/Alertness: Awake/alert Behavior During Therapy: WFL for tasks assessed/performed Overall Cognitive Status: Impaired/Different from baseline Area of Impairment: Problem solving, Safety/judgement, Awareness                          Safety/Judgement: Decreased awareness of safety, Decreased awareness of deficits     General Comments: patient is able to follow single step commands consistently. he required occasional safety cues with mobility with decreased awareness of deficits        Exercises General Exercises - Lower Extremity Ankle Circles/Pumps: AROM, Strengthening, Both, 10 reps, Supine Heel Slides: AAROM, Strengthening, Both, Supine (2 sets 10 reps each) Hip ABduction/ADduction: AAROM, Strengthening, Both, 10 reps, Supine (2 sets 10 reps each) Other Exercises Other Exercises: verbal cues for exercise technique    General Comments General comments (skin integrity, edema, etc.): patient fatigued with activity overall      Pertinent Vitals/Pain Pain Assessment Pain Assessment: No/denies pain    Home Living                          Prior Function            PT Goals (current goals can now be found in the care plan section) Acute Rehab PT Goals Patient Stated Goal: to return home in a few days PT Goal Formulation: With patient Time For Goal Achievement: 12/26/21 Potential to Achieve Goals: Fair Progress towards PT goals: Progressing toward goals    Frequency    Min 2X/week      PT Plan Current plan remains appropriate    Co-evaluation              AM-PAC PT "6 Clicks" Mobility   Outcome Measure  Help needed turning from your back to your side while in a flat bed without using bedrails?: A Little Help needed moving from lying on your back to sitting on the side of a flat bed without using bedrails?: A Little Help needed moving to and from a bed to a chair (including a wheelchair)?: A Little Help needed standing up from a chair using your arms (e.g., wheelchair or bedside chair)?: A Little Help needed to walk in hospital room?: A Lot Help needed climbing 3-5 steps with a railing? : A Lot 6 Click Score: 16    End of Session   Activity Tolerance:  Patient limited by fatigue Patient left: in bed;with call bell/phone within reach;with bed alarm set Nurse Communication: Mobility status PT Visit Diagnosis: Unsteadiness on feet (R26.81);Other abnormalities of gait and mobility (R26.89);Muscle weakness (generalized) (M62.81);Difficulty in walking, not elsewhere classified (R26.2);History of falling (Z91.81)     Time: 3532-9924 PT Time Calculation (min) (ACUTE ONLY): 23 min  Charges:  $Therapeutic Exercise: 8-22 mins $Therapeutic Activity: 8-22 mins                     Minna Merritts, PT, MPT    Percell Locus 12/19/2021, 1:17 PM

## 2021-12-20 DIAGNOSIS — N390 Urinary tract infection, site not specified: Secondary | ICD-10-CM | POA: Diagnosis not present

## 2021-12-20 DIAGNOSIS — Z9989 Dependence on other enabling machines and devices: Secondary | ICD-10-CM

## 2021-12-20 DIAGNOSIS — G4733 Obstructive sleep apnea (adult) (pediatric): Secondary | ICD-10-CM

## 2021-12-20 DIAGNOSIS — R0682 Tachypnea, not elsewhere classified: Secondary | ICD-10-CM

## 2021-12-20 DIAGNOSIS — A419 Sepsis, unspecified organism: Secondary | ICD-10-CM

## 2021-12-20 DIAGNOSIS — R7401 Elevation of levels of liver transaminase levels: Secondary | ICD-10-CM

## 2021-12-20 DIAGNOSIS — I10 Essential (primary) hypertension: Secondary | ICD-10-CM

## 2021-12-20 DIAGNOSIS — R7881 Bacteremia: Secondary | ICD-10-CM | POA: Diagnosis not present

## 2021-12-20 DIAGNOSIS — B952 Enterococcus as the cause of diseases classified elsewhere: Secondary | ICD-10-CM | POA: Diagnosis not present

## 2021-12-20 LAB — CBC WITH DIFFERENTIAL/PLATELET
Abs Immature Granulocytes: 0.38 10*3/uL — ABNORMAL HIGH (ref 0.00–0.07)
Basophils Absolute: 0.1 10*3/uL (ref 0.0–0.1)
Basophils Relative: 1 %
Eosinophils Absolute: 0.3 10*3/uL (ref 0.0–0.5)
Eosinophils Relative: 3 %
HCT: 41.6 % (ref 39.0–52.0)
Hemoglobin: 13.7 g/dL (ref 13.0–17.0)
Immature Granulocytes: 3 %
Lymphocytes Relative: 11 %
Lymphs Abs: 1.2 10*3/uL (ref 0.7–4.0)
MCH: 28.5 pg (ref 26.0–34.0)
MCHC: 32.9 g/dL (ref 30.0–36.0)
MCV: 86.5 fL (ref 80.0–100.0)
Monocytes Absolute: 0.7 10*3/uL (ref 0.1–1.0)
Monocytes Relative: 7 %
Neutro Abs: 8.5 10*3/uL — ABNORMAL HIGH (ref 1.7–7.7)
Neutrophils Relative %: 75 %
Platelets: 309 10*3/uL (ref 150–400)
RBC: 4.81 MIL/uL (ref 4.22–5.81)
RDW: 14.3 % (ref 11.5–15.5)
WBC: 11.2 10*3/uL — ABNORMAL HIGH (ref 4.0–10.5)
nRBC: 0 % (ref 0.0–0.2)

## 2021-12-20 LAB — COMPREHENSIVE METABOLIC PANEL
ALT: 64 U/L — ABNORMAL HIGH (ref 0–44)
AST: 43 U/L — ABNORMAL HIGH (ref 15–41)
Albumin: 2.6 g/dL — ABNORMAL LOW (ref 3.5–5.0)
Alkaline Phosphatase: 45 U/L (ref 38–126)
Anion gap: 10 (ref 5–15)
BUN: 39 mg/dL — ABNORMAL HIGH (ref 8–23)
CO2: 23 mmol/L (ref 22–32)
Calcium: 8.4 mg/dL — ABNORMAL LOW (ref 8.9–10.3)
Chloride: 106 mmol/L (ref 98–111)
Creatinine, Ser: 2.59 mg/dL — ABNORMAL HIGH (ref 0.61–1.24)
GFR, Estimated: 25 mL/min — ABNORMAL LOW (ref >60)
Glucose, Bld: 102 mg/dL — ABNORMAL HIGH (ref 70–99)
Potassium: 3.8 mmol/L (ref 3.5–5.1)
Sodium: 139 mmol/L (ref 135–145)
Total Bilirubin: 1 mg/dL (ref 0.3–1.2)
Total Protein: 6.1 g/dL — ABNORMAL LOW (ref 6.5–8.1)

## 2021-12-20 LAB — CK: Total CK: 522 U/L — ABNORMAL HIGH (ref 49–397)

## 2021-12-20 MED ORDER — SODIUM CHLORIDE 0.9 % IV SOLN: INTRAVENOUS | Status: DC | PRN

## 2021-12-20 NOTE — Progress Notes (Signed)
PT Cancellation Note  Patient Details Name: [name] MRN: [id number] DOB: [birth year]   Cancelled Treatment:    Reason Eval/Treat Not Completed: Patient at procedure or test/unavailable. RN in room trying to start IV. Will re-attempt later as time allows.    Zenovia Justman 12/20/2021, 11:34 AM

## 2021-12-20 NOTE — Progress Notes (Signed)
PROGRESS NOTE  Luis Strong    DOB: Aug 10, 1946, 76 y.o.  FXT:[id number]  PCP: Juline Patch, MD   Code Status: Full Code   DOA: 12/11/2021   LOS: 8  Brief Narrative of Current Hospitalization  Luis Strong is a 76 y.o. male with a PMH significant for CKD-3B, CAD, CVA, OSA on CPAP, HTN, GI bleed/esophageal ulcer, and nephrolithiasis s/p bilateral J stent on 12/05/2021. They presented from home to the ED on 12/11/2021 with general malaise and fever xseveral days. In the ED, it was found that they had sepsis. They were treated with IV Abx and supportive care.  Patient was admitted to medicine service for further workup and management of sepsis as outlined in detail below.  12/20/21 -stable  Assessment & Plan  Principal Problem:   Severe sepsis ([place]) Active Problems:   HTN (hypertension)   OSA on CPAP   Complicated UTI (urinary tract infection)   Abnormal LFTs   Acute kidney injury superimposed on CKD lllb (HCC)   History of GI bleed   History of peptic ulcer   S/P cystoscopy with ureteral stent placement   High anion gap metabolic acidosis  Severe sepsis due to bacteremia and complicated UTI and patient with nephrolithiasis s/p bilateral JJ stent on 12/05/2020 -Enterococcus faecalis bacteremia -Elevated liver function test secondary to sepsis  POA.  Had fever, tachycardia, tachypnea, AKI and lactic acidosis.  Culture data as above.  Lactic acidosis resolved.  Pro-Cal elevated to 7.92.  Leukocytosis improving. Kidney function improving Liver function improving - continue IV ampicillin per ID recommendations, will need 14-day course Per ID may be able to transition to oral antibiotic, will need reassessment Monitor vitals and fever curve   Atrial fibrillation with rapid ventricular response New onset Required Cardizem gtt., now weaned off HR controlled - Continue cardizem CD 180 daily - Continue home metoprolol Defer anticoagulation for now as likely A. fib was driven  by underlying severe sepsis.  Can re-visit this at later date once kidney function improved   AKI on CKD-3B- improving. Cr 2.59 today  Likely from rhabdomyolysis and sepsis.   CT renal stone study without hydronephrosis or obstruction Creatinine improving, still not at baseline Monitor UOP Daily renal function   Anion gap metabolic acidosis  Likely due to renal failure.  Improved. -Continue monitoring   Possible fall at home Rhabdomyolysis CK elevated to 31,000>522.    Elevated liver enzymes/hyperbilirubinemia- improved. AST/ALT- 43/64   Essential hypertension: Normotensive for most part. -Continue home metoprolol   OSA on CPAP -Continue nightly CPAP   History of GI bleed/esophageal ulcer -Continue home Protonix   Thrombocytopenia- resolved. Platelets 309 -SCD for VTE prophylaxis  DVT prophylaxis: Place and maintain sequential compression device Start: 12/12/21 0752   Diet:  Diet Orders (From admission, onward)     Start     Ordered   12/12/21 0034  Diet Heart Room service appropriate? Yes; Fluid consistency: Thin  Diet effective now       Question Answer Comment  Room service appropriate? Yes   Fluid consistency: Thin      12/12/21 0034            Subjective 12/20/21    Pt reports feeling well overall. He is ready for discharge to SNF. Understands he is to complete antibiotic course  Disposition Plan & Communication  Patient status: Inpatient  Admitted From: Home Disposition: Skilled nursing facility Anticipated discharge date: 2/6  Family Communication: none  Consults, Procedures, Significant Events  Consultants:  ID   Procedures/significant events:  None  Antimicrobials:  Anti-infectives (From admission, onward)    Start     Dose/Rate Route Frequency Ordered Stop   12/12/21 2200  ceFEPIme (MAXIPIME) 2 g in sodium chloride 0.9 % 100 mL IVPB  Status:  Discontinued        2 g 200 mL/hr over 30 Minutes Intravenous Every 24 hours 12/12/21 0340  12/12/21 1311   12/12/21 1400  ampicillin (OMNIPEN) 2 g in sodium chloride 0.9 % 100 mL IVPB        2 g 300 mL/hr over 20 Minutes Intravenous Every 8 hours 12/12/21 1311 12/26/21 1359   12/11/21 2130  ceFEPIme (MAXIPIME) 2 g in sodium chloride 0.9 % 100 mL IVPB        2 g 200 mL/hr over 30 Minutes Intravenous  Once 12/11/21 2123 12/11/21 2236       Objective   Vitals:   12/19/21 1505 12/19/21 2003 12/19/21 2257 12/20/21 0510  BP: 132/77 (!) 146/77 (!) 146/78 (!) 146/79  Pulse: 69 71 74 66  Resp: 18 18 18 18   Temp: 97.7 F (36.5 C) 97.8 F (36.6 C) 98.9 F (37.2 C) 98 F (36.7 C)  TempSrc:      SpO2: 99% 99% 99% 97%  Weight:      Height:        Intake/Output Summary (Last 24 hours) at 12/20/2021 0734 Last data filed at 12/20/2021 0655 Gross per 24 hour  Intake 1460.17 ml  Output 1300 ml  Net 160.17 ml   Filed Weights   12/16/21 0500 12/17/21 0500 12/18/21 0500  Weight: 96.2 kg 96 kg 93.5 kg    Patient BMI: Body mass index is 30.44 kg/m.   Physical Exam:  General: awake, alert, NAD HEENT: atraumatic, clear conjunctiva, anicteric sclera, MMM, hearing grossly normal Respiratory: normal respiratory effort. Cardiovascular: normal S1/S2, RRR, no JVD, murmurs, quick capillary refill  Gastrointestinal: soft, NT, ND Nervous: A&O x3. no gross focal neurologic deficits, normal speech Extremities: moves all equally, no edema, normal tone Skin: dry, intact, normal temperature, normal color. No rashes, lesions or ulcers on exposed skin Psychiatry: normal mood, congruent affect  Labs   I have personally reviewed following labs and imaging studies CBC    Component Value Date/Time   WBC 11.2 (H) 12/20/2021 0647   RBC 4.81 12/20/2021 0647   HGB 13.7 12/20/2021 0647   HGB 12.3 (L) 09/22/2019 1519   HCT 41.6 12/20/2021 0647   HCT 38.5 09/22/2019 1519   PLT 309 12/20/2021 0647   PLT 196 09/22/2019 1519   MCV 86.5 12/20/2021 0647   MCV 79 09/22/2019 1519   MCV 88 04/18/2012  1325   MCH 28.5 12/20/2021 0647   MCHC 32.9 12/20/2021 0647   RDW 14.3 12/20/2021 0647   RDW 14.1 09/22/2019 1519   RDW 13.2 04/18/2012 1325   LYMPHSABS 1.2 12/20/2021 0647   LYMPHSABS 2.1 07/04/2018 1036   LYMPHSABS 2.2 04/18/2012 1325   MONOABS 0.7 12/20/2021 0647   MONOABS 0.7 04/18/2012 1325   EOSABS 0.3 12/20/2021 0647   EOSABS 0.4 07/04/2018 1036   EOSABS 0.4 04/18/2012 1325   BASOSABS 0.1 12/20/2021 0647   BASOSABS 0.1 07/04/2018 1036   BASOSABS 0.0 04/18/2012 1325   BMP Latest Ref Rng & Units 12/20/2021 12/19/2021 12/18/2021  Glucose 70 - 99 mg/dL 102(H) 98 112(H)  BUN 8 - 23 mg/dL 39(H) 44(H) 53(H)  Creatinine 0.61 - 1.24 mg/dL 2.59(H) 2.68(H) 2.91(H)  BUN/Creat Ratio 10 - 24 - - -  Sodium 135 - 145 mmol/L 139 137 139  Potassium 3.5 - 5.1 mmol/L 3.8 3.8 4.0  Chloride 98 - 111 mmol/L 106 106 109  CO2 22 - 32 mmol/L 23 20(L) 21(L)  Calcium 8.9 - 10.3 mg/dL 8.4(L) 8.3(L) 8.2(L)   Imaging Studies  No results found.  Medications   Scheduled Meds:  aspirin EC  81 mg Oral Daily   citalopram  40 mg Oral Daily   diltiazem  180 mg Oral Daily   feeding supplement  237 mL Oral TID BM   metoprolol tartrate  25 mg Oral BID   multivitamin with minerals  1 tablet Oral Daily   pantoprazole  40 mg Oral Daily   No recently discontinued medications to reconcile  LOS: 8 days   Richarda Osmond, DO [facility] 12/20/2021, 7:34 AM   Available by Epic secure chat 7AM-7PM. If 7PM-7AM, please contact night-coverage Refer to [email or web address] to contact the [facility] [employer] Attending or Consulting provider for this pt

## 2021-12-20 NOTE — Progress Notes (Signed)
° °  Date of Admission:  12/11/2021     ID: Luis Strong is a 76 y.o. male  Principal Problem:   Severe sepsis ([name]) Active Problems:   HTN (hypertension)   OSA on CPAP   Complicated UTI (urinary tract infection)   Abnormal LFTs   Acute kidney injury superimposed on CKD lllb (HCC)   History of GI bleed   History of peptic ulcer   S/P cystoscopy with ureteral stent placement   High anion gap metabolic acidosis   Elevated alanine aminotransferase (ALT) level   Sepsis with acute liver failure without hepatic coma or septic shock (HCC)   Tachypnea   Subjective: Patient is doing better More energy No pain No fever Passing urine okay Appetite is good  Medications:   aspirin EC  81 mg Oral Daily   citalopram  40 mg Oral Daily   diltiazem  180 mg Oral Daily   feeding supplement  237 mL Oral TID BM   metoprolol tartrate  25 mg Oral BID   multivitamin with minerals  1 tablet Oral Daily    Objective: Vital signs in last 24 hours: Temp:  [97.8 F (36.6 C)-98.9 F (37.2 C)] 98.3 F (36.8 C) (02/01 1500) Pulse Rate:  [66-80] 80 (02/01 1500) Resp:  [18-20] 20 (02/01 1500) BP: (146-160)/(76-80) 160/80 (02/01 1500) SpO2:  [97 %-99 %] 99 % (02/01 1500)  PHYSICAL EXAM:  General: looks well, no distress Lungs: Bilateral air entry.  Heart: Regular rate and rhythm, no murmur, rub or gallop. Abdomen: Soft, non-tender,not distended. Bowel sounds normal. No masses Skin: No rashes or lesions. Or bruising Lymph: Cervical, supraclavicular normal. Neurologic: Grossly non-focal  Lab Results Recent Labs    12/19/21 0704 12/20/21 0647  WBC 10.6* 11.2*  HGB 13.8 13.7  HCT 41.9 41.6  NA 137 139  K 3.8 3.8  CL 106 106  CO2 20* 23  BUN 44* 39*  CREATININE 2.68* 2.59*   Liver Panel Recent Labs    12/19/21 0704 12/20/21 0647  PROT 6.0* 6.1*  ALBUMIN 2.6* 2.6*  AST 54* 43*  ALT 74* 64*  ALKPHOS 48 45  BILITOT 1.0 1.0  Microbiology: 1/23 BC/UC- enterococcus  fecalis 12/14/2021 repeat blood culture NG   Assessment/Plan:  Enterococcus bacteremia with sepsis secondary to complicated UTI following laser lithotripsy and stent placement on the right side.  Repeat blood cultures had no growth Patient is currently on IV ampicillin and staying 9 today.  After 10 days we may be able to change to p.o. amoxicillin to complete 14 days of treatment.  End date of antibiotic is December 25, 2021 Patient does not have any heart device or valve replacement and hence endocarditis is less likely No joint replacements  AKI on CKD- Improving multifactorial-  sepsis/rhabdomyolysis /pre renal    Severe rhabdomyolysis- due to fall and being on the floor --contributing to AKI- management as per primary team.  Is improving  transaminitis due to sepsis/rhabdo Improving Discussed the management with the patient in detail.

## 2021-12-20 NOTE — Progress Notes (Signed)
Physical Therapy Treatment Patient Details Name: Luis Strong MRN: [id number] DOB: 1946-05-21 Today's Date: 12/20/2021   History of Present Illness Luis Strong is a 76 y.o. male with medical history significant for CKD 3B, HTN, CAD, CVA, OSA on CPAP, history of GI bleed 2020 with nonbleeding esophageal ulcer on EGD, nephrolithiasis s/p ureteroscopy with bilateral J stent placement on 1/17, who presents to the ED with generalized malaise and fever starting 2 days after his procedure.  He denies abdominal pain, nausea or vomiting and denies diarrhea.  Has no cough, shortness of breath or chest pain.    PT Comments    Patient is received in bed, he is agreeable to PT session. He is the most clear minded since his admission. Patient is mod independent with bed mobility, requiring supervision for safety. He performed sit to stand with min guard and ambulated 160 feet with rail in hall or min hand held assist. Patient continues to be at increased fall risk, however is making good progress and if continues to progress will be safe to return home with assistance. At this time recommendation of SNF is still appropriate.       Recommendations for follow up therapy are one component of a multi-disciplinary discharge planning process, led by the attending physician.  Recommendations may be updated based on patient status, additional functional criteria and insurance authorization.  Follow Up Recommendations  Skilled nursing-short term rehab (<3 hours/day)     Assistance Recommended at Discharge Intermittent Supervision/Assistance  Patient can return home with the following A little help with bathing/dressing/bathroom;Assistance with cooking/housework;Assist for transportation;Help with stairs or ramp for entrance;A little help with walking and/or transfers;Direct supervision/assist for medications management   Equipment Recommendations  Rolling walker (2 wheels)    Recommendations for Other  Services       Precautions / Restrictions Precautions Precautions: Fall Restrictions Weight Bearing Restrictions: No     Mobility  Bed Mobility Overal bed mobility: Needs Assistance Bed Mobility: Supine to Sit     Supine to sit: Supervision     General bed mobility comments: supervision for safety    Transfers Overall transfer level: Needs assistance Equipment used: None Transfers: Sit to/from Stand Sit to Stand: Min guard           General transfer comment: Able to stand from low bed with supervision/min guard.    Ambulation/Gait Ambulation/Gait assistance: Min assist Gait Distance (Feet): 160 Feet Assistive device: 1 person hand held assist Gait Pattern/deviations: Step-through pattern, Decreased step length - right, Decreased step length - left, Drifts right/left Gait velocity: decreased     General Gait Details: Patient ambulated around nursing station holding to rail in hallway or 1 hand assist for balance. Improved endurance with mobility.  He is unable to walk without holding at all.  No obvious sob this session.   Stairs             Wheelchair Mobility    Modified Rankin (Stroke Patients Only)       Balance Overall balance assessment: Needs assistance, History of Falls Sitting-balance support: Feet supported Sitting balance-Leahy Scale: Good     Standing balance support: Single extremity supported, During functional activity Standing balance-Leahy Scale: Fair Standing balance comment: Min assist without AD.                            Cognition Arousal/Alertness: Awake/alert Behavior During Therapy: WFL for tasks assessed/performed Overall Cognitive Status: Impaired/Different from  baseline Area of Impairment: Problem solving, Safety/judgement, Awareness                     Memory: Decreased short-term memory Following Commands: Follows one step commands consistently Safety/Judgement: Decreased awareness of  safety, Decreased awareness of deficits Awareness: Intellectual Problem Solving: Requires verbal cues, Requires tactile cues General Comments: Pt A&Ox4, although presents with decreased short term memory, decreased awareness        Exercises      General Comments        Pertinent Vitals/Pain Pain Assessment Pain Assessment: No/denies pain    Home Living                          Prior Function            PT Goals (current goals can now be found in the care plan section) Acute Rehab PT Goals Patient Stated Goal: to return home in a few days PT Goal Formulation: With patient Time For Goal Achievement: 12/26/21 Potential to Achieve Goals: Fair Progress towards PT goals: Progressing toward goals    Frequency    Min 2X/week      PT Plan Current plan remains appropriate    Co-evaluation              AM-PAC PT "6 Clicks" Mobility   Outcome Measure  Help needed turning from your back to your side while in a flat bed without using bedrails?: None Help needed moving from lying on your back to sitting on the side of a flat bed without using bedrails?: None Help needed moving to and from a bed to a chair (including a wheelchair)?: A Little Help needed standing up from a chair using your arms (e.g., wheelchair or bedside chair)?: A Little Help needed to walk in hospital room?: A Little Help needed climbing 3-5 steps with a railing? : A Lot 6 Click Score: 19    End of Session Equipment Utilized During Treatment: Gait belt Activity Tolerance: Patient tolerated treatment well Patient left: in chair;with chair alarm set;with nursing/sitter in room Nurse Communication: Mobility status PT Visit Diagnosis: Unsteadiness on feet (R26.81);Other abnormalities of gait and mobility (R26.89);Muscle weakness (generalized) (M62.81);Difficulty in walking, not elsewhere classified (R26.2);History of falling (Z91.81)     Time: 0370-4888 PT Time Calculation (min)  (ACUTE ONLY): 18 min  Charges:  $Gait Training: 8-22 mins                     Darwin Guastella, PT, GCS 12/20/21,2:18 PM

## 2021-12-21 ENCOUNTER — Encounter: Payer: Medicare Other | Admitting: Urology

## 2021-12-21 NOTE — Progress Notes (Signed)
Physical Therapy Treatment Patient Details Name: Luis Strong MRN: [id number] DOB: [birth year] Today's Date: 12/21/2021   History of Present Illness Luis Strong is a 76 y.o. male with medical history significant for CKD 3B, HTN, CAD, CVA, OSA on CPAP, history of GI bleed 2020 with nonbleeding esophageal ulcer on EGD, nephrolithiasis s/p ureteroscopy with bilateral J stent placement on 1/17, who presents to the ED with generalized malaise and fever starting 2 days after his procedure.  He denies abdominal pain, nausea or vomiting and denies diarrhea.  Has no cough, shortness of breath or chest pain.    PT Comments    Patient received in bed. Agrees to PT session. Patient independent with bed mobility. Transfers with supervision and ambulated > 300 feet with RW and supervision. Cues for increased step length and cadence, proximity to RW. Patient will continue to benefit from skilled PT while here to improve safety with mobility for return home at discharge.      Recommendations for follow up therapy are one component of a multi-disciplinary discharge planning process, led by the attending physician.  Recommendations may be updated based on patient status, additional functional criteria and insurance authorization.  Follow Up Recommendations  Home health PT     Assistance Recommended at Discharge Frequent or constant Supervision/Assistance  Patient can return home with the following A little help with bathing/dressing/bathroom;Assistance with cooking/housework;Assist for transportation;Help with stairs or ramp for entrance;A little help with walking and/or transfers;Direct supervision/assist for medications management   Equipment Recommendations  Rolling walker (2 wheels)    Recommendations for Other Services       Precautions / Restrictions Precautions Precautions: Fall Restrictions Weight Bearing Restrictions: No     Mobility  Bed Mobility Overal bed mobility:  Independent                  Transfers Overall transfer level: Needs assistance Equipment used: Rolling walker (2 wheels) Transfers: Sit to/from Stand Sit to Stand: Supervision                Ambulation/Gait Ambulation/Gait assistance: Supervision Gait Distance (Feet): 300 Feet Assistive device: Rolling walker (2 wheels) Gait Pattern/deviations: Step-through pattern, Decreased step length - right, Decreased step length - left, Decreased stride length, Shuffle Gait velocity: decreased     General Gait Details: 2 laps around nursing station with RW. Slow, small shuffle steps. Cues needed to increase cadence and step length. Patient ambulates like he is unsure of himself. Supervision only with cues for direction.   Stairs             Wheelchair Mobility    Modified Rankin (Stroke Patients Only)       Balance Overall balance assessment: Needs assistance Sitting-balance support: Feet supported Sitting balance-Leahy Scale: Good     Standing balance support: Bilateral upper extremity supported, During functional activity, Reliant on assistive device for balance Standing balance-Leahy Scale: Fair Standing balance comment: Min assist without AD. Supervision with walker                            Cognition Arousal/Alertness: Awake/alert Behavior During Therapy: WFL for tasks assessed/performed Overall Cognitive Status: Impaired/Different from baseline Area of Impairment: Memory                     Memory: Decreased short-term memory Following Commands: Follows one step commands consistently     Problem Solving: Requires verbal cues General Comments: Pt A&Ox4,  although presents with decreased short term memory        Exercises      General Comments        Pertinent Vitals/Pain Pain Assessment Pain Assessment: No/denies pain    Home Living                          Prior Function            PT Goals  (current goals can now be found in the care plan section) Acute Rehab PT Goals Patient Stated Goal: to return home in a few days PT Goal Formulation: With patient Time For Goal Achievement: 12/26/21 Potential to Achieve Goals: Good Progress towards PT goals: Progressing toward goals    Frequency    Min 2X/week      PT Plan Current plan remains appropriate    Co-evaluation              AM-PAC PT "6 Clicks" Mobility   Outcome Measure  Help needed turning from your back to your side while in a flat bed without using bedrails?: None Help needed moving from lying on your back to sitting on the side of a flat bed without using bedrails?: None Help needed moving to and from a bed to a chair (including a wheelchair)?: A Little Help needed standing up from a chair using your arms (e.g., wheelchair or bedside chair)?: A Little Help needed to walk in hospital room?: A Little Help needed climbing 3-5 steps with a railing? : A Little 6 Click Score: 20    End of Session Equipment Utilized During Treatment: Gait belt Activity Tolerance: Patient tolerated treatment well Patient left: in chair;with chair alarm set;with nursing/sitter in room Nurse Communication: Mobility status PT Visit Diagnosis: Unsteadiness on feet (R26.81);Other abnormalities of gait and mobility (R26.89);Muscle weakness (generalized) (M62.81);Difficulty in walking, not elsewhere classified (R26.2);History of falling (Z91.81)     Time: 9480-1655 PT Time Calculation (min) (ACUTE ONLY): 17 min  Charges:  $Gait Training: 8-22 mins                     Valla Pacey, PT, GCS 12/21/21,11:19 AM

## 2021-12-21 NOTE — Progress Notes (Signed)
PROGRESS NOTE  Luis Strong    DOB: August 04, 1946, 76 y.o.  ZOX:[id number]  PCP: Juline Patch, MD   Code Status: Full Code   DOA: 12/11/2021   LOS: 9  Brief Narrative of Current Hospitalization  Luis Strong is a 76 y.o. male with a PMH significant for CKD-3B, CAD, CVA, OSA on CPAP, HTN, GI bleed/esophageal ulcer, and nephrolithiasis s/p bilateral J stent on 12/05/2021. They presented from home to the ED on 12/11/2021 with general malaise and fever xseveral days. In the ED, it was found that they had sepsis. They were treated with IV Abx and supportive care.  Patient was admitted to medicine service for further workup and management of sepsis as outlined in detail below.  12/21/21 -stable  Assessment & Plan  Principal Problem:   Severe sepsis ([place]) Active Problems:   HTN (hypertension)   OSA on CPAP   Complicated UTI (urinary tract infection)   Abnormal LFTs   Acute kidney injury superimposed on CKD lllb (HCC)   History of GI bleed   History of peptic ulcer   S/P cystoscopy with ureteral stent placement   High anion gap metabolic acidosis   Elevated alanine aminotransferase (ALT) level   Sepsis with acute liver failure without hepatic coma or septic shock (HCC)   Tachypnea  Severe sepsis due to bacteremia and complicated UTI and patient with nephrolithiasis s/p bilateral JJ stent on 12/05/2020 -Enterococcus faecalis bacteremia -Elevated liver function test secondary to sepsis  POA.  Had fever, tachycardia, tachypnea, AKI and lactic acidosis.  Culture data as above.  Lactic acidosis resolved.  Pro-Cal elevated to 7.92.  Leukocytosis improving. Kidney function improving Liver function improving - continue IV ampicillin per ID recommendations, will need 14-day course (last day 2/6) Per ID may be able to transition to oral antibiotic- amoxicillin- after 10 days (tomorrow), will need reassessment Monitor vitals and fever curve   Atrial fibrillation with rapid ventricular  response New onset Required Cardizem gtt., now weaned off HR controlled - Continue cardizem CD 180 daily - Continue home metoprolol Defer anticoagulation for now as likely A. fib was driven by underlying severe sepsis.  Can re-visit this at later date once kidney function improved   AKI on CKD-3B- improving. Cr 2.59. Cr today is pending  Likely from rhabdomyolysis and sepsis.   CT renal stone study without hydronephrosis or obstruction Creatinine improving, still not at baseline Monitor UOP Daily renal function   Anion gap metabolic acidosis  Likely due to renal failure.  Improved. -Continue monitoring   Possible fall at home Rhabdomyolysis CK elevated to 31,000>522.    Elevated liver enzymes/hyperbilirubinemia- improved. AST/ALT- 43/64   Essential hypertension: Normotensive for most part. -Continue home metoprolol   OSA on CPAP -Continue nightly CPAP   History of GI bleed/esophageal ulcer -Continue home Protonix   Thrombocytopenia- resolved. Platelets 309 -SCD for VTE prophylaxis  DVT prophylaxis: Place and maintain sequential compression device Start: 12/12/21 0752   Diet:  Diet Orders (From admission, onward)     Start     Ordered   12/12/21 0034  Diet Heart Room service appropriate? Yes; Fluid consistency: Thin  Diet effective now       Question Answer Comment  Room service appropriate? Yes   Fluid consistency: Thin      12/12/21 0034            Subjective 12/21/21    Pt reports no complaints today. He feels well overall.  Disposition Plan & Communication  Patient  status: Inpatient  Admitted From: Home Disposition: HHPT Anticipated discharge date: 2/3  Family Communication: none  Consults, Procedures, Significant Events  Consultants:  ID  Procedures/significant events:  None  Antimicrobials:  Anti-infectives (From admission, onward)    Start     Dose/Rate Route Frequency Ordered Stop   12/12/21 2200  ceFEPIme (MAXIPIME) 2 g in sodium  chloride 0.9 % 100 mL IVPB  Status:  Discontinued        2 g 200 mL/hr over 30 Minutes Intravenous Every 24 hours 12/12/21 0340 12/12/21 1311   12/12/21 1400  ampicillin (OMNIPEN) 2 g in sodium chloride 0.9 % 100 mL IVPB        2 g 300 mL/hr over 20 Minutes Intravenous Every 8 hours 12/12/21 1311 12/26/21 1359   12/11/21 2130  ceFEPIme (MAXIPIME) 2 g in sodium chloride 0.9 % 100 mL IVPB        2 g 200 mL/hr over 30 Minutes Intravenous  Once 12/11/21 2123 12/11/21 2236       Objective   Vitals:   12/21/21 0107 12/21/21 0358 12/21/21 0500 12/21/21 0700  BP: (!) 150/93 134/81  137/83  Pulse: 72 67  67  Resp: 16 19  18   Temp: 98.3 F (36.8 C)   97.7 F (36.5 C)  TempSrc:    Oral  SpO2: 98% 98%  98%  Weight:   95.3 kg   Height:        Intake/Output Summary (Last 24 hours) at 12/21/2021 0809 Last data filed at 12/21/2021 0413 Gross per 24 hour  Intake 918.26 ml  Output 625 ml  Net 293.26 ml    Filed Weights   12/17/21 0500 12/18/21 0500 12/21/21 0500  Weight: 96 kg 93.5 kg 95.3 kg    Patient BMI: Body mass index is 31.01 kg/m.   Physical Exam:  General: awake, alert, NAD HEENT: atraumatic, clear conjunctiva, anicteric sclera, MMM, hearing grossly normal Respiratory: normal respiratory effort. Cardiovascular: normal S1/S2, RRR, no JVD, murmurs, quick capillary refill  Gastrointestinal: soft, NT, ND Nervous: A&O x3. no gross focal neurologic deficits, normal speech Extremities: moves all equally, no edema, normal tone Skin: dry, intact, normal temperature, normal color. No rashes, lesions or ulcers on exposed skin Psychiatry: normal mood, congruent affect  Labs   I have personally reviewed following labs and imaging studies CBC    Component Value Date/Time   WBC 11.2 (H) 12/20/2021 0647   RBC 4.81 12/20/2021 0647   HGB 13.7 12/20/2021 0647   HGB 12.3 (L) 09/22/2019 1519   HCT 41.6 12/20/2021 0647   HCT 38.5 09/22/2019 1519   PLT 309 12/20/2021 0647   PLT 196  09/22/2019 1519   MCV 86.5 12/20/2021 0647   MCV 79 09/22/2019 1519   MCV 88 04/18/2012 1325   MCH 28.5 12/20/2021 0647   MCHC 32.9 12/20/2021 0647   RDW 14.3 12/20/2021 0647   RDW 14.1 09/22/2019 1519   RDW 13.2 04/18/2012 1325   LYMPHSABS 1.2 12/20/2021 0647   LYMPHSABS 2.1 07/04/2018 1036   LYMPHSABS 2.2 04/18/2012 1325   MONOABS 0.7 12/20/2021 0647   MONOABS 0.7 04/18/2012 1325   EOSABS 0.3 12/20/2021 0647   EOSABS 0.4 07/04/2018 1036   EOSABS 0.4 04/18/2012 1325   BASOSABS 0.1 12/20/2021 0647   BASOSABS 0.1 07/04/2018 1036   BASOSABS 0.0 04/18/2012 1325   BMP Latest Ref Rng & Units 12/20/2021 12/19/2021 12/18/2021  Glucose 70 - 99 mg/dL 102(H) 98 112(H)  BUN 8 - 23 mg/dL 39(H)  44(H) 53(H)  Creatinine 0.61 - 1.24 mg/dL 2.59(H) 2.68(H) 2.91(H)  BUN/Creat Ratio 10 - 24 - - -  Sodium 135 - 145 mmol/L 139 137 139  Potassium 3.5 - 5.1 mmol/L 3.8 3.8 4.0  Chloride 98 - 111 mmol/L 106 106 109  CO2 22 - 32 mmol/L 23 20(L) 21(L)  Calcium 8.9 - 10.3 mg/dL 8.4(L) 8.3(L) 8.2(L)   Imaging Studies  No results found.  Medications   Scheduled Meds:  aspirin EC  81 mg Oral Daily   citalopram  40 mg Oral Daily   diltiazem  180 mg Oral Daily   feeding supplement  237 mL Oral TID BM   metoprolol tartrate  25 mg Oral BID   multivitamin with minerals  1 tablet Oral Daily   No recently discontinued medications to reconcile  LOS: 9 days   Richarda Osmond, DO [facility] 12/21/2021, 8:09 AM   Available by Epic secure chat 7AM-7PM. If 7PM-7AM, please contact night-coverage Refer to [email or web address] to contact the [facility] Attending or Consulting provider for this pt

## 2021-12-21 NOTE — Care Management Important Message (Signed)
Important Message  Patient Details  Name: Luis Strong MRN: [id number] Date of Birth: [birth year]   Medicare Important Message Given:  Yes     Dannette Barbara 12/21/2021, 2:06 PM

## 2021-12-22 DIAGNOSIS — N390 Urinary tract infection, site not specified: Secondary | ICD-10-CM | POA: Diagnosis not present

## 2021-12-22 DIAGNOSIS — N179 Acute kidney failure, unspecified: Secondary | ICD-10-CM | POA: Diagnosis not present

## 2021-12-22 DIAGNOSIS — R7881 Bacteremia: Secondary | ICD-10-CM | POA: Diagnosis not present

## 2021-12-22 DIAGNOSIS — B952 Enterococcus as the cause of diseases classified elsewhere: Secondary | ICD-10-CM | POA: Diagnosis not present

## 2021-12-22 LAB — CREATININE, SERUM
Creatinine, Ser: 2.46 mg/dL — ABNORMAL HIGH (ref 0.61–1.24)
GFR, Estimated: 26 mL/min — ABNORMAL LOW (ref >60)

## 2021-12-22 MED ORDER — DILTIAZEM HCL ER COATED BEADS 180 MG PO CP24: 180.0000 mg | ORAL_CAPSULE | Freq: Every day | ORAL | 0 refills | Status: DC

## 2021-12-22 MED ORDER — AMOXICILLIN 500 MG PO CAPS
500.0000 mg | ORAL_CAPSULE | Freq: Once | ORAL | Status: AC
  Administered 2021-12-22: 500 mg via ORAL
  Filled 2021-12-22 (×2): qty 1

## 2021-12-22 MED ORDER — AMOXICILLIN 500 MG PO CAPS: 1000.0000 mg | ORAL_CAPSULE | Freq: Two times a day (BID) | ORAL | 0 refills | Status: AC

## 2021-12-22 MED ORDER — AMOXICILLIN 500 MG PO CAPS: 500.0000 mg | ORAL_CAPSULE | Freq: Two times a day (BID) | ORAL | 0 refills | Status: DC

## 2021-12-22 MED ORDER — AMOXICILLIN 500 MG PO CAPS
1000.0000 mg | ORAL_CAPSULE | Freq: Two times a day (BID) | ORAL | Status: DC
  Filled 2021-12-22: qty 2

## 2021-12-22 MED ORDER — AMOXICILLIN 500 MG PO CAPS
500.0000 mg | ORAL_CAPSULE | Freq: Two times a day (BID) | ORAL | Status: DC
  Administered 2021-12-22: 500 mg via ORAL
  Filled 2021-12-22 (×3): qty 1

## 2021-12-22 NOTE — Progress Notes (Signed)
° °  Date of Admission:  12/11/2021     ID: Luis Strong is a 76 y.o. male  Principal Problem:   Severe sepsis ([place]) Active Problems:   HTN (hypertension)   OSA on CPAP   Complicated UTI (urinary tract infection)   Abnormal LFTs   AKI (acute kidney injury) ([place])   History of GI bleed   History of peptic ulcer   S/P cystoscopy with ureteral stent placement   High anion gap metabolic acidosis   Elevated alanine aminotransferase (ALT) level   Sepsis with acute liver failure without hepatic coma or septic shock (HCC)   Tachypnea   Subjective: Doing well No complaints  Medications:   amoxicillin  500 mg Oral Q12H   aspirin EC  81 mg Oral Daily   citalopram  40 mg Oral Daily   diltiazem  180 mg Oral Daily   feeding supplement  237 mL Oral TID BM   metoprolol tartrate  25 mg Oral BID   multivitamin with minerals  1 tablet Oral Daily    Objective: Vital signs in last 24 hours: Temp:  [97.8 F (36.6 C)-98.3 F (36.8 C)] 98.2 F (36.8 C) (02/03 1115) Pulse Rate:  [67-80] 72 (02/03 1115) Resp:  [16-20] 19 (02/03 1115) BP: (134-152)/(68-85) 152/79 (02/03 1115) SpO2:  [95 %-99 %] 98 % (02/03 1115) Weight:  [88 kg] 88 kg (02/03 0500)  PHYSICAL EXAM:  General: looks well, no distress Lungs: Bilateral air entry.  Heart: Regular rate and rhythm, no murmur, rub or gallop. Abdomen: Soft, non-tender,not distended. Bowel sounds normal. No masses Skin: No rashes or lesions. Or bruising Lymph: Cervical, supraclavicular normal. Neurologic: Grossly non-focal  Lab Results Recent Labs    12/20/21 0647 12/22/21 0651  WBC 11.2*  --   HGB 13.7  --   HCT 41.6  --   NA 139  --   K 3.8  --   CL 106  --   CO2 23  --   BUN 39*  --   CREATININE 2.59* 2.46*   Liver Panel Recent Labs    12/20/21 0647  PROT 6.1*  ALBUMIN 2.6*  AST 43*  ALT 64*  ALKPHOS 66  BILITOT 1.0  Microbiology: 1/23 BC/UC- enterococcus fecalis 12/14/2021 repeat blood culture  NG   Assessment/Plan:  Enterococcus bacteremia with sepsis secondary to complicated UTI following laser lithotripsy and stent placement on the right side.  Repeat blood cultures had no growth Pt completed 10 dys of IV ampicillin .. changed to p.o. amoxicillin 1 gram  Q 12  to complete 14 days of treatment.  End date of antibiotic is December 25, 2021 Patient does not have any heart device or valve replacement and hence endocarditis is less likely No joint replacements  AKI on CKD- Improving multifactorial-  sepsis/rhabdomyolysis /pre renal    Severe rhabdomyolysis- due to fall and being on the floor --contributing to AKI- much better  transaminitis due to sepsis/rhabdo Much better Discussed the management with the patient and son in detail. They will follow with uroligist

## 2021-12-22 NOTE — TOC Transition Note (Signed)
Transition of Care [facility]) - CM/SW Discharge Note   Patient Details  Name: ERIVERTO BYRNES MRN: [id number] Date of Birth: 03/04/1946  [facility] CM/SW Contact:  Alberteen Sam, LCSW Phone Number: 12/22/2021, 11:45 AM   Clinical Narrative:     Patient will dc home today with home health PT and [facility], Corene Cornea informed of dc today.   No further needs identified.    Final next level of care: [place] Barriers to Discharge: No Barriers Identified   Patient Goals and CMS Choice Patient states their goals for this hospitalization and ongoing recovery are:: to go home CMS Medicare.gov Compare Post Acute Care list provided to:: Patient Choice offered to / list presented to : Patient  Discharge Placement                    Patient and family notified of of transfer: 12/22/21  Discharge Plan and Services   Discharge Planning Services: CM Consult Post Acute Care Choice: Home Health          DME Arranged: N/A DME Agency: NA       HH Arranged: PT, OT [place] Agency: [place] ([facility]) Date HH Agency Contacted: 12/22/21 Time [place]: 1607 Representative spoke with at [place]: [place] ([place]) Interventions     Readmission Risk Interventions No flowsheet data found.

## 2021-12-22 NOTE — Discharge Summary (Signed)
Physician Discharge Summary  MAKALE PINDELL WIO:[id number] DOB: 1946/07/26 DOA: 12/11/2021  PCP: Juline Patch, MD  Admit date: 12/11/2021 Discharge date: 12/22/2021  Admitted From: Home Disposition: Home health  Recommendations for Outpatient Follow-up:  Follow up with PCP within 1-2 weeks Recommend repeat urinalysis for evaluation of hematuria.  Recommend metabolic panel Follow up with cardiology in regard to Afib therapy modifications if necessary  Physical therapy Equipment/Devices:rolling walker  Discharge Condition:stable, improved CODE STATUS:  Code Status: Full Code  Cardiac diet  Brief/Interim Summary: Pt presented from home to the ED on 12/11/2021 with general malaise and fever x several days. In the ED, it was found that they had sepsis from complicated UTI after recent bilateral JJ stent placement on 1/17. Blood cultures were positive for Enterococcus faecalis. They were treated with IV Abx and supportive care. Repeat cultures were negative x5 days. He recovered well and ID recommended a full 14 days of antibiotics which were continued at discharge.  New onset of Afib with RVR in setting of acute illness. Well controlled on cardizem. Not started on anticoagulation. Will need follow up with cardiology.   Discharge Diagnoses:  Principal Problem:   Severe sepsis ([place]) Active Problems:   HTN (hypertension)   OSA on CPAP   Complicated UTI (urinary tract infection)   Abnormal LFTs   AKI (acute kidney injury) ([place])   History of GI bleed   History of peptic ulcer   S/P cystoscopy with ureteral stent placement   High anion gap metabolic acidosis   Elevated alanine aminotransferase (ALT) level   Sepsis with acute liver failure without hepatic coma or septic shock (HCC)   Tachypnea    Allergies as of 12/22/2021       Reactions   Capsaicin Itching, Rash   Severe rash and itching.        Medication List     STOP taking these medications    CoQ10 100 MG  Caps   Magnesium 250 MG Tabs   Omega-3 1000 MG Caps   tamsulosin 0.4 MG Caps capsule Commonly known as: FLOMAX   Turmeric 500 MG Caps       TAKE these medications    acetaminophen 500 MG tablet Commonly known as: TYLENOL Take 1,000 mg by mouth every 6 (six) hours as needed for moderate pain.   amoxicillin 500 MG capsule Commonly known as: AMOXIL Take 1 capsule (500 mg total) by mouth every 12 (twelve) hours for 3 days.   aspirin EC 81 MG tablet Take 81 mg by mouth daily. Swallow whole.   cetirizine 10 MG tablet Commonly known as: ZYRTEC Take 10 mg by mouth as needed for allergies.   citalopram 40 MG tablet Commonly known as: CELEXA Take 1 tablet (40 mg total) by mouth daily.   diltiazem 180 MG 24 hr capsule Commonly known as: CARDIZEM CD Take 1 capsule (180 mg total) by mouth daily.   Flaxseed Oil 1200 MG Caps Take 1,200 mg by mouth daily.   hydrocortisone cream 1 % Apply 1 application topically daily as needed for itching.   LUBRICATING EYE DROPS OP Place 1 drop into both eyes daily as needed (dry eyes).   Melatonin 3 MG Caps Take 3 mg by mouth at bedtime.   metoprolol succinate 25 MG 24 hr tablet Commonly known as: TOPROL-XL Take 1 tablet (25 mg total) by mouth at bedtime.   multivitamin with minerals Tabs tablet Take 1 tablet by mouth daily.   pantoprazole 40 MG tablet Commonly known  as: PROTONIX TAKE 1 TABLET TWICE A DAY BEFORE MEALS   rosuvastatin 20 MG tablet Commonly known as: CRESTOR Take 1 tablet (20 mg total) by mouth at bedtime.   trospium 20 MG tablet Commonly known as: SANCTURA Take 1 tablet (20 mg total) by mouth 2 (two) times daily as needed (Urinary frequency, urgency, bladder spasms/stent irritation). What changed: when to take this        Allergies  Allergen Reactions   Capsaicin Itching and Rash    Severe rash and itching.    Consultations: Cardiology ID  Procedures/Studies: DG Chest Port 1 View  Result Date:  12/16/2021 CLINICAL DATA:  Cough EXAM: PORTABLE CHEST 1 VIEW COMPARISON:  12/12/2021 FINDINGS: Transverse diameter of heart is increased. There is interval decrease in interstitial markings in both lungs. No new focal infiltrates are seen. There is no pleural effusion or pneumothorax. IMPRESSION: There is interval decrease in interstitial markings in both lungs suggesting resolving interstitial edema or interstitial pneumonia. There are no new focal infiltrates. There is no pleural effusion. Electronically Signed   By: Elmer Picker M.D.   On: 12/16/2021 12:14   DG Chest Port 1 View  Result Date: 12/12/2021 CLINICAL DATA:  Tachypnea.  Weakness. EXAM: PORTABLE CHEST 1 VIEW COMPARISON:  12/11/2021 FINDINGS: Midline trachea. Normal heart size for level of inspiration. No pleural effusion or pneumothorax. Moderate pulmonary interstitial thickening is new or increased. No lobar consolidation. Mild left base scarring or subsegmental atelectasis. IMPRESSION: Moderate pulmonary interstitial thickening, new or increased. In this never smoker, considerations include mild pulmonary venous congestion or viral/atypical/mycoplasma pneumonia. Electronically Signed   By: Abigail Miyamoto M.D.   On: 12/12/2021 16:50   DG Chest Port 1 View  Result Date: 12/11/2021 CLINICAL DATA:  Generalized weakness EXAM: PORTABLE CHEST 1 VIEW COMPARISON:  06/23/2018 FINDINGS: Mild bronchitic changes. Streaky atelectasis or scar left base. No consolidation, pleural effusion, or pneumothorax. Cardiomediastinal silhouette within normal limits. Aortic atherosclerosis. IMPRESSION: Mild bronchitic changes with atelectasis or scar at the left base. Electronically Signed   By: Donavan Foil M.D.   On: 12/11/2021 21:40   DG OR UROLOGY CYSTO IMAGE ([place])  Result Date: 12/05/2021 There is no interpretation for this exam.  This order is for images obtained during a surgical procedure.  Please See "Surgeries" Tab for more information  regarding the procedure.   ECHOCARDIOGRAM COMPLETE  Result Date: 12/13/2021    ECHOCARDIOGRAM REPORT   Patient Name:   Luis Strong Date of Exam: 12/12/2021 Medical Rec #:  [id number]         Height:       69.0 in Accession #:    [id number]        Weight:       210.0 lb Date of Birth:  [birth year]        BSA:          2.109 m Patient Age:    76 years          BP:           120/68 mmHg Patient Gender: M                 HR:           108 bpm. Exam Location:  [facility] Procedure: 2D Echo, Cardiac Doppler and Color Doppler Indications:     R78.81 Bacteremia  History:         Patient has no prior history of Echocardiogram examinations.  Stroke; Risk Factors:Hypertension, Dyslipidemia and Sleep                  Apnea.  Sonographer:     Cresenciano Lick RDCS Referring Phys:  [id number] Charlesetta Ivory GONFA Diagnosing Phys: Nelva Bush MD IMPRESSIONS  1. Left ventricular ejection fraction, by estimation, is 55 to 60%. The left ventricle has normal function. The left ventricle has no regional wall motion abnormalities. Left ventricular diastolic parameters are indeterminate.  2. Right ventricular systolic function is normal. The right ventricular size is normal.  3. The mitral valve is degenerative. Trivial mitral valve regurgitation. No evidence of mitral stenosis.  4. The aortic valve is tricuspid. There is mild thickening of the aortic valve. Aortic valve regurgitation is mild. Aortic valve sclerosis is present, with no evidence of aortic valve stenosis.  5. Aortic dilatation noted. There is borderline dilatation of the aortic root, measuring 38 mm. There is mild dilatation of the ascending aorta, measuring 38 mm.  6. The inferior vena cava is normal in size with <50% respiratory variability, suggesting right atrial pressure of 8 mmHg. Conclusion(s)/Recommendation(s): There is no definite evidence of a vegetation. However, native valvular disease is present. Consider transesophageal echocardiogram if  clinical concern for endocarditis persists. FINDINGS  Left Ventricle: Left ventricular ejection fraction, by estimation, is 55 to 60%. The left ventricle has normal function. The left ventricle has no regional wall motion abnormalities. The left ventricular internal cavity size was normal in size. There is  no left ventricular hypertrophy. Left ventricular diastolic parameters are indeterminate. Right Ventricle: The right ventricular size is normal. No increase in right ventricular wall thickness. Right ventricular systolic function is normal. Left Atrium: Left atrial size was normal in size. Right Atrium: Right atrial size was normal in size. Pericardium: There is no evidence of pericardial effusion. Mitral Valve: The mitral valve is degenerative in appearance. There is mild thickening of the mitral valve leaflet(s). There is mild calcification of the mitral valve leaflet(s). Trivial mitral valve regurgitation. No evidence of mitral valve stenosis. Tricuspid Valve: The tricuspid valve is normal in structure. Tricuspid valve regurgitation is trivial. Aortic Valve: The aortic valve is tricuspid. There is mild thickening of the aortic valve. Aortic valve regurgitation is mild. Aortic valve sclerosis is present, with no evidence of aortic valve stenosis. Pulmonic Valve: The pulmonic valve was not well visualized. Pulmonic valve regurgitation is not visualized. No evidence of pulmonic stenosis. Aorta: Aortic dilatation noted. There is borderline dilatation of the aortic root, measuring 38 mm. There is mild dilatation of the ascending aorta, measuring 38 mm. Pulmonary Artery: The pulmonary artery is not well seen. Venous: The inferior vena cava is normal in size with less than 50% respiratory variability, suggesting right atrial pressure of 8 mmHg. IAS/Shunts: No atrial level shunt detected by color flow Doppler.  LEFT VENTRICLE PLAX 2D LVIDd:         4.70 cm LVIDs:         3.40 cm LV PW:         1.00 cm LV IVS:         0.90 cm LVOT diam:     2.10 cm LV SV:         51 LV SV Index:   24 LVOT Area:     3.46 cm  RIGHT VENTRICLE             IVC RV Basal diam:  4.10 cm     IVC diam: 1.50 cm RV S prime:  13.20 cm/s TAPSE (M-mode): 2.0 cm LEFT ATRIUM             Index        RIGHT ATRIUM           Index LA diam:        5.10 cm 2.42 cm/m   RA Area:     13.20 cm LA Vol (A2C):   54.9 ml 26.03 ml/m  RA Volume:   35.90 ml  17.02 ml/m LA Vol (A4C):   48.8 ml 23.14 ml/m LA Biplane Vol: 54.2 ml 25.70 ml/m  AORTIC VALVE LVOT Vmax:   89.40 cm/s LVOT Vmean:  66.500 cm/s LVOT VTI:    0.148 m  AORTA Ao Root diam: 3.80 cm Ao Asc diam:  3.70 cm MV E velocity: 111.50 cm/s                             SHUNTS                             Systemic VTI:  0.15 m                             Systemic Diam: 2.10 cm Nelva Bush MD Electronically signed by Nelva Bush MD Signature Date/Time: 12/13/2021/10:37:34 AM    Final    CT Renal Stone Study  Result Date: 12/11/2021 CLINICAL DATA:  Nephrolithiasis, flank pain, sepsis EXAM: CT ABDOMEN AND PELVIS WITHOUT CONTRAST TECHNIQUE: Multidetector CT imaging of the abdomen and pelvis was performed following the standard protocol without IV contrast. RADIATION DOSE REDUCTION: This exam was performed according to the departmental dose-optimization program which includes automated exposure control, adjustment of the mA and/or kV according to patient size and/or use of iterative reconstruction technique. COMPARISON:  11/21/2021 FINDINGS: Lower chest: The visualized lung bases are clear. Cardiac size within normal limits. No pericardial effusion. Small hiatal hernia. Hepatobiliary: No focal liver abnormality is seen. Status post cholecystectomy. No biliary dilatation. Pancreas: Unremarkable Spleen: Unremarkable Adrenals/Urinary Tract: The adrenal glands are unremarkable. The kidneys are normal in size and position. Since the prior examination, nonobstructing calculus within the a right ureter has been  removed and a double-J ureteral stent is seen in expected position extending from the right upper pole into the urinary bladder. Multiple nonobstructing renal calculi are again seen bilaterally which appear grossly stable in size and position since prior examination with the dominant 7 mm calculus seen within the lower pole the right kidney. Multiple simple cortical cysts are seen bilaterally. No hydronephrosis. No ureteral calculi on the left. The bladder is partially decompressed. Tiny dependently layering 2 mm calcification may represent a recently passed calculus or residua of recent intervention. Stomach/Bowel: Moderate sigmoid diverticulosis. The stomach, small bowel, and large bowel are otherwise unremarkable. No evidence of obstruction focal inflammation. The appendix is normal. Vascular/Lymphatic: Aortic atherosclerosis. No enlarged abdominal or pelvic lymph nodes. Reproductive: The prostate gland is mildly enlarged and indents the base of the bladder. Seminal vesicles are unremarkable. Other: No abdominal wall hernia or abnormality. No abdominopelvic ascites. Musculoskeletal: No acute bone abnormality. No lytic or blastic bone lesion. Degenerative changes are seen within the lumbar spine. IMPRESSION: Interval removal of previously noted right ureteral calculus in placement of a double-J ureteral stent in expected position. No hydronephrosis. Stable superimposed bilateral nonobstructing nephrolithiasis. New tiny calculus within the bladder lumen  likely the residua of recent intervention or a recently passed calculus. Moderate sigmoid diverticulosis without superimposed acute inflammatory change. Mild prostatic enlargement. Aortic Atherosclerosis (ICD10-I70.0). Electronically Signed   By: Fidela Salisbury M.D.   On: 12/11/2021 21:55   US Abdomen Limited RUQ (LIVER/GB)  Result Date: 12/11/2021 CLINICAL DATA:  Elevated liver enzymes. EXAM: ULTRASOUND ABDOMEN LIMITED RIGHT UPPER QUADRANT COMPARISON:  CT  abdomen pelvis dated 12/11/2021. FINDINGS: Gallbladder: Cholecystectomy. Common bile duct: Diameter: 6 mm Liver: The liver demonstrates a normal echogenicity. There is a small right liver calcified granuloma. Portal vein is patent on color Doppler imaging with normal direction of blood flow towards the liver. Other: Several cysts noted in the right kidney. IMPRESSION: 1. Cholecystectomy. 2. Right renal cysts. Electronically Signed   By: Anner Crete M.D.   On: 12/11/2021 23:27    Subjective: Patient feels very well. He has no complaints. He's anxious to get home.   Discharge Exam: Vitals:   12/22/21 0431 12/22/21 0742  BP: 135/72 (!) 147/78  Pulse: 67 69  Resp: 16 20  Temp: 97.8 F (36.6 C) 98 F (36.7 C)  SpO2: 98% 97%    General: Pt is alert, awake, not in acute distress Cardiovascular: RRR, S1/S2 +, no rubs, no gallops Respiratory: CTA bilaterally, no wheezing, no rhonchi Abdominal: Soft, NT, ND, bowel sounds + Extremities: no edema, no cyanosis  Labs: Basic Metabolic Panel: Recent Labs  Lab 12/16/21 0511 12/17/21 0533 12/18/21 0544 12/19/21 0704 12/20/21 0647 12/22/21 0651  NA 136 138 139 137 139  --   K 4.1 4.2 4.0 3.8 3.8  --   CL 110 111 109 106 106  --   CO2 16* 16* 21* 20* 23  --   GLUCOSE 100* 99 112* 98 102*  --   BUN 66* 60* 53* 44* 39*  --   CREATININE 3.14* 3.05* 2.91* 2.68* 2.59* 2.46*  CALCIUM 7.9* 8.1* 8.2* 8.3* 8.4*  --    CBC: Recent Labs  Lab 12/16/21 0511 12/17/21 0533 12/18/21 0544 12/19/21 0704 12/20/21 0647  WBC 9.0 10.4 10.6* 10.6* 11.2*  NEUTROABS 7.0 7.9* 7.8* 7.9* 8.5*  HGB 13.2 13.6 13.2 13.8 13.7  HCT 39.3 40.3 39.2 41.9 41.6  MCV 84.3 83.6 83.9 86.9 86.5  PLT 135* 184 222 263 309    Microbiology Recent Results (from the past 240 hour(s))  CULTURE, BLOOD (ROUTINE X 2) w Reflex to ID Panel     Status: None   Collection Time: 12/14/21  4:50 AM   Specimen: BLOOD  Result Value Ref Range Status   Specimen Description BLOOD  BLOOD RIGHT WRIST  Final   Special Requests   Final    BOTTLES DRAWN AEROBIC AND ANAEROBIC Blood Culture adequate volume   Culture   Final    NO GROWTH 5 DAYS Performed at [facility], [address] [place], [place] [address]    Report Status 12/19/2021 FINAL  Final  CULTURE, BLOOD (ROUTINE X 2) w Reflex to ID Panel     Status: None   Collection Time: 12/14/21  4:50 AM   Specimen: BLOOD  Result Value Ref Range Status   Specimen Description BLOOD BLOOD RIGHT HAND  Final   Special Requests   Final    BOTTLES DRAWN AEROBIC AND ANAEROBIC Blood Culture adequate volume   Culture   Final    NO GROWTH 5 DAYS Performed at [facility], [address] [place], [place] [address]    Report Status 12/19/2021 FINAL  Final    Time coordinating discharge: Over 30 minutes  Richarda Osmond, MD  [facility] 12/22/2021, 10:51 AM

## 2021-12-22 NOTE — Progress Notes (Signed)
Nursing Discharge Note   Admit Date: 12/11/2021  Discharge date: 12/22/2021   Luis Strong is to be discharged home per MD order.  AVS completed. Reviewed with patient and family at bedside. Highlighted copy provided for patient to take home.  Patient/caregiver able to verbalize understanding of discharge instructions. PIV removed. Patient stable upon discharge.   Patients home pharmacy is [facility], which will not provide his medications until 5 days later. Patient also uses [facility] on [address] in [place], [place]. Dr. Ouida Sills notified, and she states she will submit the prescriptions electronically to the pharmacy. Patient and son [name] notified and agreeable with this plan.     Discharge Instructions      Please see your primary doctor within 1-2 weeks for a hospital follow up. They may want to check your urine and kidney function.  Complete your antibiotics until they are finished.   Follow up with cardiology for atrial fibrillation. You have been started on diltiazem and they may adjust your dose in the future based on how your heart rate responds.     Allergies as of 12/22/2021       Reactions   Capsaicin Itching, Rash   Severe rash and itching.        Medication List     STOP taking these medications    CoQ10 100 MG Caps   Magnesium 250 MG Tabs   Omega-3 1000 MG Caps   tamsulosin 0.4 MG Caps capsule Commonly known as: FLOMAX   Turmeric 500 MG Caps       TAKE these medications    acetaminophen 500 MG tablet Commonly known as: TYLENOL Take 1,000 mg by mouth every 6 (six) hours as needed for moderate pain.   amoxicillin 500 MG capsule Commonly known as: AMOXIL Take 2 capsules (1,000 mg total) by mouth every 12 (twelve) hours for 3 days.   aspirin EC 81 MG tablet Take 81 mg by mouth daily. Swallow whole.   cetirizine 10 MG tablet Commonly known as: ZYRTEC Take 10 mg by mouth as needed for allergies.   citalopram 40 MG  tablet Commonly known as: CELEXA Take 1 tablet (40 mg total) by mouth daily.   diltiazem 180 MG 24 hr capsule Commonly known as: CARDIZEM CD Take 1 capsule (180 mg total) by mouth daily.   Flaxseed Oil 1200 MG Caps Take 1,200 mg by mouth daily.   hydrocortisone cream 1 % Apply 1 application topically daily as needed for itching.   LUBRICATING EYE DROPS OP Place 1 drop into both eyes daily as needed (dry eyes).   Melatonin 3 MG Caps Take 3 mg by mouth at bedtime.   metoprolol succinate 25 MG 24 hr tablet Commonly known as: TOPROL-XL Take 1 tablet (25 mg total) by mouth at bedtime.   multivitamin with minerals Tabs tablet Take 1 tablet by mouth daily.   pantoprazole 40 MG tablet Commonly known as: PROTONIX TAKE 1 TABLET TWICE A DAY BEFORE MEALS   rosuvastatin 20 MG tablet Commonly known as: CRESTOR Take 1 tablet (20 mg total) by mouth at bedtime.   trospium 20 MG tablet Commonly known as: SANCTURA Take 1 tablet (20 mg total) by mouth 2 (two) times daily as needed (Urinary frequency, urgency, bladder spasms/stent irritation). What changed: when to take this         Discharge Instructions/ Education: Discharge instructions given to patient/family with verbalized understanding. Discharge education completed with patient/family including: follow up instructions, medication list, discharge activities,  and limitations if indicated.  Patient escorted via wheelchair to lobby and discharged home via private automobile.

## 2021-12-22 NOTE — Progress Notes (Signed)
Physical Therapy Treatment Patient Details Name: Luis Strong MRN: [id number] DOB: August 11, 1946 Today's Date: 12/22/2021   History of Present Illness Luis Strong is a 76 y.o. male with medical history significant for CKD 3B, HTN, CAD, CVA, OSA on CPAP, history of GI bleed 2020 with nonbleeding esophageal ulcer on EGD, nephrolithiasis s/p ureteroscopy with bilateral J stent placement on 1/17, who presents to the ED with generalized malaise and fever starting 2 days after his procedure.  He denies abdominal pain, nausea or vomiting and denies diarrhea.  Has no cough, shortness of breath or chest pain.    PT Comments    Pt seen for PT tx with pt agreeable with encouragement. Pt reports prior to admission he was ambulatory without AD & has a flight of stairs to access extra rooms in house but also has a stair lift he can use. Session focused on gait without AD & high level dynamic balance during mobility to facilitate return to PLOF. Pt is able to ambulate 2 laps around nurses station without AD with CGA with shuffled gait pattern with cuing but poor ability to sustain corrected gait with increased dorsiflexion & heel strike BLE. Pt declines practicing stairs on this date. Back in room, pt performs 5x sit<>stand without BUE support with close supervision<>CGA with good ability to power to standing. Will continue to follow pt acutely to continue to address high level balance, endurance, & stair negotiation.    Recommendations for follow up therapy are one component of a multi-disciplinary discharge planning process, led by the attending physician.  Recommendations may be updated based on patient status, additional functional criteria and insurance authorization.  Follow Up Recommendations  Home health PT     Assistance Recommended at Discharge Frequent or constant Supervision/Assistance  Patient can return home with the following A little help with bathing/dressing/bathroom;Assistance with  cooking/housework;Assist for transportation;Help with stairs or ramp for entrance;A little help with walking and/or transfers;Direct supervision/assist for medications management   Equipment Recommendations  Rolling walker (2 wheels)    Recommendations for Other Services       Precautions / Restrictions Precautions Precautions: Fall Precaution Comments: poor awareness Restrictions Weight Bearing Restrictions: No     Mobility  Bed Mobility               General bed mobility comments: not observed, pt received & left sitting in recliner    Transfers Overall transfer level: Needs assistance Equipment used: None Transfers: Sit to/from Stand Sit to Stand: Supervision, Min guard                Ambulation/Gait Ambulation/Gait assistance: Min guard Gait Distance (Feet): 320 Feet Assistive device: None Gait Pattern/deviations: Shuffle, Decreased dorsiflexion - left, Decreased dorsiflexion - right, Decreased step length - left, Decreased step length - right, Decreased stride length Gait velocity: decreased     General Gait Details: Decreased heel strike BLE with PT providing cuing & pt attempting to correct but unable to sustain.   Stairs Stairs:  (declines on this date)           Wheelchair Mobility    Modified Rankin (Stroke Patients Only)       Balance Overall balance assessment: Needs assistance Sitting-balance support: Feet supported Sitting balance-Leahy Scale: Good     Standing balance support: No upper extremity supported, During functional activity Standing balance-Leahy Scale: Fair  Cognition Arousal/Alertness: Awake/alert Behavior During Therapy: WFL for tasks assessed/performed Overall Cognitive Status: Within Functional Limits for tasks assessed                                          Exercises Other Exercises Other Exercises: Pt performs 5x sit<>stand without UE support  with CGA for BLE strengthening with good ability to power to standing.    General Comments        Pertinent Vitals/Pain Pain Assessment Pain Assessment: No/denies pain    Home Living                          Prior Function            PT Goals (current goals can now be found in the care plan section) Acute Rehab PT Goals Patient Stated Goal: go home PT Goal Formulation: With patient Time For Goal Achievement: 12/26/21 Potential to Achieve Goals: Good Progress towards PT goals: Progressing toward goals    Frequency    Min 2X/week      PT Plan Current plan remains appropriate    Co-evaluation              AM-PAC PT "6 Clicks" Mobility   Outcome Measure  Help needed turning from your back to your side while in a flat bed without using bedrails?: None Help needed moving from lying on your back to sitting on the side of a flat bed without using bedrails?: None Help needed moving to and from a bed to a chair (including a wheelchair)?: A Little Help needed standing up from a chair using your arms (e.g., wheelchair or bedside chair)?: A Little Help needed to walk in hospital room?: A Little Help needed climbing 3-5 steps with a railing? : A Little 6 Click Score: 20    End of Session Equipment Utilized During Treatment: Gait belt Activity Tolerance: Patient tolerated treatment well Patient left: in chair;with chair alarm set;with call bell/phone within reach   PT Visit Diagnosis: Unsteadiness on feet (R26.81);Other abnormalities of gait and mobility (R26.89);Muscle weakness (generalized) (M62.81);Difficulty in walking, not elsewhere classified (R26.2);History of falling (Z91.81)     Time: 4431-5400 PT Time Calculation (min) (ACUTE ONLY): 12 min  Charges:  $Therapeutic Activity: 8-22 mins                     Lavone Nian, PT, DPT 12/22/21, 2:44 PM    Waunita Schooner 12/22/2021, 2:42 PM

## 2021-12-22 NOTE — Progress Notes (Signed)
Occupational Therapy Treatment Patient Details Name: Luis Strong MRN: [id number] DOB: [birth year] Today's Date: 12/22/2021   History of present illness Luis Strong is a 76 y.o. male with medical history significant for CKD 3B, HTN, CAD, CVA, OSA on CPAP, history of GI bleed 2020 with nonbleeding esophageal ulcer on EGD, nephrolithiasis s/p ureteroscopy with bilateral J stent placement on 1/17, who presents to the ED with generalized malaise and fever starting 2 days after his procedure.  He denies abdominal pain, nausea or vomiting and denies diarrhea.  Has no cough, shortness of breath or chest pain.   OT comments  Pt seen for OT tx this date to f/u re: safety with ADLs/ADL mobility. Pt demos improved cognition and balance today although still somewhat unsure of his footing requiring close standby assist and increased time with no AD to complete fxl mobility around nursing station and his room. OT engages pt in standing self care sink-side with RW, tolerating 6 minute stand while completing oral hygiene. OT re-administers pill-box assessment and pt narrowly passes with one error and requiring 13 minutes and 56 seconds to complete task (effectively considered 2 errors and 3 is considered a failure). OT discusses with pt increasing mental exercise as he is already avid about physical exercise as a part of his normal routine. Pt agreeable and understanding of recommendation. Pt left with all needs met and in reach, RN updated on session contents. Will continue to follow. D/c recommendation updated to Mildred.   Recommendations for follow up therapy are one component of a multi-disciplinary discharge planning process, led by the attending physician.  Recommendations may be updated based on patient status, additional functional criteria and insurance authorization.    Follow Up Recommendations  Home health OT    Assistance Recommended at Discharge Intermittent Supervision/Assistance  Patient can  return home with the following  A little help with walking and/or transfers;A little help with bathing/dressing/bathroom;Direct supervision/assist for medications management;Direct supervision/assist for financial management;Assist for transportation;Help with stairs or ramp for entrance;Assistance with cooking/housework   Equipment Recommendations  Other (comment) (2ww)    Recommendations for Other Services      Precautions / Restrictions Precautions Precautions: Fall Precaution Comments: poor awareness Restrictions Weight Bearing Restrictions: No       Mobility Bed Mobility Overal bed mobility: Independent Bed Mobility: Supine to Sit     Supine to sit: Supervision     General bed mobility comments: supervision for safety    Transfers Overall transfer level: Needs assistance Equipment used: None Transfers: Sit to/from Stand Sit to Stand: Supervision           General transfer comment: no AD, good control, requires some increased time to steady before engaging in fxl mobility, close standy for fxl mobility     Balance Overall balance assessment: Needs assistance Sitting-balance support: Feet supported Sitting balance-Leahy Scale: Good     Standing balance support: Bilateral upper extremity supported, During functional activity, Reliant on assistive device for balance Standing balance-Leahy Scale: Fair                             ADL either performed or assessed with clinical judgement   ADL Overall ADL's : Needs assistance/impaired     Grooming: Wash/dry hands;Wash/dry face;Oral care;Supervision/safety;Standing Grooming Details (indicate cue type and reason): sink-side, no AD, F to G static standing balance to complete 3 g/h tasks, stands for ~6 minutes after completing one loop around nursing station  Functional mobility during ADLs: Supervision/safety (no AD, one loop around nursing station)       Extremity/Trunk Assessment              Vision       Perception     Praxis      Cognition Arousal/Alertness: Awake/alert Behavior During Therapy: WFL for tasks assessed/performed Overall Cognitive Status: Within Functional Limits for tasks assessed                                 General Comments: pt is alert and oriented, appropriate with all commands. Some delayed processing. Re-administered pill box and pt did pass this date, but was noted to have one error and required 13 minutes and 56 seconds (5 minutes are allotted to complete the test). Effectively 2 errors and 3 errors is considered failing.        Exercises      Shoulder Instructions       General Comments      Pertinent Vitals/ Pain       Pain Assessment Pain Assessment: No/denies pain  Home Living                                          Prior Functioning/Environment              Frequency  Min 3X/week        Progress Toward Goals  OT Goals(current goals can now be found in the care plan section)  Progress towards OT goals: Progressing toward goals  Acute Rehab OT Goals Patient Stated Goal: to go home and be independent OT Goal Formulation: With patient Time For Goal Achievement: 12/26/21 Potential to Achieve Goals: Good  Plan Frequency remains appropriate;Discharge plan remains appropriate    Co-evaluation                 AM-PAC OT "6 Clicks" Daily Activity     Outcome Measure   Help from another person eating meals?: None Help from another person taking care of personal grooming?: A Little Help from another person toileting, which includes using toliet, bedpan, or urinal?: A Little Help from another person bathing (including washing, rinsing, drying)?: A Lot Help from another person to put on and taking off regular upper body clothing?: A Little Help from another person to put on and taking off regular lower body clothing?: A Little 6  Click Score: 18    End of Session Equipment Utilized During Treatment: Gait belt  OT Visit Diagnosis: Unsteadiness on feet (R26.81);History of falling (Z91.81)   Activity Tolerance Patient tolerated treatment well   Patient Left in chair;with call bell/phone within reach;with chair alarm set   Nurse Communication Mobility status        Time: 0623-7628 OT Time Calculation (min): 46 min  Charges: OT General Charges $OT Visit: 1 Visit OT Treatments $Self Care/Home Management : 23-37 mins $Therapeutic Activity: 8-22 mins  Gerrianne Scale, MS, OTR/L ascom [phone] 12/22/21, 2:37 PM

## 2021-12-22 NOTE — Discharge Instructions (Addendum)
Please see your primary doctor within 1-2 weeks for a hospital follow up. They may want to check your urine and kidney function.  Complete your antibiotics until they are finished.   Follow up with cardiology for atrial fibrillation. You have been started on diltiazem and they may adjust your dose in the future based on how your heart rate responds.

## 2021-12-25 ENCOUNTER — Telehealth: Payer: Self-pay

## 2021-12-25 ENCOUNTER — Other Ambulatory Visit: Payer: Self-pay

## 2021-12-25 ENCOUNTER — Ambulatory Visit (INDEPENDENT_AMBULATORY_CARE_PROVIDER_SITE_OTHER): Payer: Medicare Other

## 2021-12-25 VITALS — BP 120/74 | HR 71 | Temp 98.1°F | Resp 15 | Ht 69.0 in | Wt 192.6 lb

## 2021-12-25 DIAGNOSIS — Z0000 Encounter for general adult medical examination without abnormal findings: Secondary | ICD-10-CM
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
## 2021-12-25 NOTE — Telephone Encounter (Signed)
Transition Care Management Follow-up Telephone Call Date of discharge and from where: 12/22/21 [facility] How have you been since you were released from the hospital? Pt states he is doing okay, feeling better since being at home Any questions or concerns? No  Items Reviewed: Did the pt receive and understand the discharge instructions provided? Yes  Medications obtained and verified? Yes  Other? No  Any new allergies since your discharge? No  Dietary orders reviewed? Yes Do you have support at home? Yes   Home Care and Equipment/Supplies: Were home health services ordered? yes If so, what is the name of the agency? [place] set up a time to come to the patient's home? no Were any new equipment or medical supplies ordered?  No  Functional Questionnaire: (I = Independent and D = Dependent) ADLs: I  Bathing/Dressing- I  Meal Prep- i  Eating- I  Maintaining continence- I  Transferring/Ambulation- I  Managing Meds- I  Follow up appointments reviewed:  PCP Hospital f/u appt confirmed? Yes  Scheduled to see Dr. Ronnald Ramp on 01/01/22 @ 1:20. [place] [facility] f/u appt confirmed? No  Awaiting appt with Dr. Nehemiah Massed cardiology. Are transportation arrangements needed? No  If their condition worsens, is the pt aware to call PCP or go to the Emergency Dept.? Yes Was the patient provided with contact information for the PCP's office or ED? Yes Was to pt encouraged to call back with questions or concerns? Yes

## 2021-12-25 NOTE — Patient Instructions (Signed)
Luis Strong , Thank you for taking time to come for your Medicare Wellness Visit. I appreciate your ongoing commitment to your health goals. Please review the following plan we discussed and let me know if I can assist you in the future.   Screening recommendations/referrals: Colonoscopy: done 12/05/17. Repeat 11/2022 Recommended yearly ophthalmology/optometry visit for glaucoma screening and checkup Recommended yearly dental visit for hygiene and checkup  Vaccinations: Influenza vaccine: done 08/19/21 Pneumococcal vaccine: done 07/02/17 Tdap vaccine: due Shingles vaccine: done 07/09/17 & 10/18/17   Covid-19: done 01/16/20, 02/13/20, 09/09/20, 02/22/21 & 07/26/21  Conditions/risks identified: recommend increasing activity as tolerated  Next appointment: Follow up in one year for your annual wellness visit.   Preventive Care 77 Years and Older, Male Preventive care refers to lifestyle choices and visits with your health care provider that can promote health and wellness. What does preventive care include? A yearly physical exam. This is also called an annual well check. Dental exams once or twice a year. Routine eye exams. Ask your health care provider how often you should have your eyes checked. Personal lifestyle choices, including: Daily care of your teeth and gums. Regular physical activity. Eating a healthy diet. Avoiding tobacco and drug use. Limiting alcohol use. Practicing safe sex. Taking low doses of aspirin every day. Taking vitamin and mineral supplements as recommended by your health care provider. What happens during an annual well check? The services and screenings done by your health care provider during your annual well check will depend on your age, overall health, lifestyle risk factors, and family history of disease. Counseling  Your health care provider may ask you questions about your: Alcohol use. Tobacco use. Drug use. Emotional well-being. Home and  relationship well-being. Sexual activity. Eating habits. History of falls. Memory and ability to understand (cognition). Work and work Statistician. Screening  You may have the following tests or measurements: Height, weight, and BMI. Blood pressure. Lipid and cholesterol levels. These may be checked every 5 years, or more frequently if you are over 77 years old. Skin check. Lung cancer screening. You may have this screening every year starting at age 77 if you have a 30-pack-year history of smoking and currently smoke or have quit within the past 15 years. Fecal occult blood test (FOBT) of the stool. You may have this test every year starting at age 77. Flexible sigmoidoscopy or colonoscopy. You may have a sigmoidoscopy every 5 years or a colonoscopy every 10 years starting at age 77. Prostate cancer screening. Recommendations will vary depending on your family history and other risks. Hepatitis C blood test. Hepatitis B blood test. Sexually transmitted disease (STD) testing. Diabetes screening. This is done by checking your blood sugar (glucose) after you have not eaten for a while (fasting). You may have this done every 1-3 years. Abdominal aortic aneurysm (AAA) screening. You may need this if you are a current or former smoker. Osteoporosis. You may be screened starting at age 77 if you are at high risk. Talk with your health care provider about your test results, treatment options, and if necessary, the need for more tests. Vaccines  Your health care provider may recommend certain vaccines, such as: Influenza vaccine. This is recommended every year. Tetanus, diphtheria, and acellular pertussis (Tdap, Td) vaccine. You may need a Td booster every 10 years. Zoster vaccine. You may need this after age 68. Pneumococcal 13-valent conjugate (PCV13) vaccine. One dose is recommended after age 75. Pneumococcal polysaccharide (PPSV23) vaccine. One dose is recommended after  age 52. Talk to your  health care provider about which screenings and vaccines you need and how often you need them. This information is not intended to replace advice given to you by your health care provider. Make sure you discuss any questions you have with your health care provider. Document Released: 12/02/2015 Document Revised: 07/25/2016 Document Reviewed: 09/06/2015 Elsevier Interactive Patient Education  2017 [place] Prevention in the Home Falls can cause injuries. They can happen to people of all ages. There are many things you can do to make your home safe and to help prevent falls. What can I do on the outside of my home? Regularly fix the edges of walkways and driveways and fix any cracks. Remove anything that might make you trip as you walk through a door, such as a raised step or threshold. Trim any bushes or trees on the path to your home. Use bright outdoor lighting. Clear any walking paths of anything that might make someone trip, such as rocks or tools. Regularly check to see if handrails are loose or broken. Make sure that both sides of any steps have handrails. Any raised decks and porches should have guardrails on the edges. Have any leaves, snow, or ice cleared regularly. Use sand or salt on walking paths during winter. Clean up any spills in your garage right away. This includes oil or grease spills. What can I do in the bathroom? Use night lights. Install grab bars by the toilet and in the tub and shower. Do not use towel bars as grab bars. Use non-skid mats or decals in the tub or shower. If you need to sit down in the shower, use a plastic, non-slip stool. Keep the floor dry. Clean up any water that spills on the floor as soon as it happens. Remove soap buildup in the tub or shower regularly. Attach bath mats securely with double-sided non-slip rug tape. Do not have throw rugs and other things on the floor that can make you trip. What can I do in the bedroom? Use night  lights. Make sure that you have a light by your bed that is easy to reach. Do not use any sheets or blankets that are too big for your bed. They should not hang down onto the floor. Have a firm chair that has side arms. You can use this for support while you get dressed. Do not have throw rugs and other things on the floor that can make you trip. What can I do in the kitchen? Clean up any spills right away. Avoid walking on wet floors. Keep items that you use a lot in easy-to-reach places. If you need to reach something above you, use a strong step stool that has a grab bar. Keep electrical cords out of the way. Do not use floor polish or wax that makes floors slippery. If you must use wax, use non-skid floor wax. Do not have throw rugs and other things on the floor that can make you trip. What can I do with my stairs? Do not leave any items on the stairs. Make sure that there are handrails on both sides of the stairs and use them. Fix handrails that are broken or loose. Make sure that handrails are as long as the stairways. Check any carpeting to make sure that it is firmly attached to the stairs. Fix any carpet that is loose or worn. Avoid having throw rugs at the top or bottom of the stairs. If you do  have throw rugs, attach them to the floor with carpet tape. Make sure that you have a light switch at the top of the stairs and the bottom of the stairs. If you do not have them, ask someone to add them for you. What else can I do to help prevent falls? Wear shoes that: Do not have high heels. Have rubber bottoms. Are comfortable and fit you well. Are closed at the toe. Do not wear sandals. If you use a stepladder: Make sure that it is fully opened. Do not climb a closed stepladder. Make sure that both sides of the stepladder are locked into place. Ask someone to hold it for you, if possible. Clearly mark and make sure that you can see: Any grab bars or handrails. First and last  steps. Where the edge of each step is. Use tools that help you move around (mobility aids) if they are needed. These include: Canes. Walkers. Scooters. Crutches. Turn on the lights when you go into a dark area. Replace any light bulbs as soon as they burn out. Set up your furniture so you have a clear path. Avoid moving your furniture around. If any of your floors are uneven, fix them. If there are any pets around you, be aware of where they are. Review your medicines with your doctor. Some medicines can make you feel dizzy. This can increase your chance of falling. Ask your doctor what other things that you can do to help prevent falls. This information is not intended to replace advice given to you by your health care provider. Make sure you discuss any questions you have with your health care provider. Document Released: 09/01/2009 Document Revised: 04/12/2016 Document Reviewed: 12/10/2014 Elsevier Interactive Patient Education  2017 [employer].

## 2021-12-26 ENCOUNTER — Telehealth: Payer: Self-pay

## 2021-12-26 NOTE — Telephone Encounter (Signed)
Called and spoke to [facility] [facility] and PT [phone]. Gave him the okay to start [facility] and PT

## 2021-12-27 ENCOUNTER — Telehealth: Payer: Self-pay | Admitting: Family Medicine

## 2021-12-27 NOTE — Telephone Encounter (Signed)
Copied from [place] [phone]. Topic: General - Other >> Dec 27, 2021 11:11 AM Valere Dross wrote: Reason for CRM: Sealy from [facility] called in requesting if the PT orders can be pushed back to Friday 02/10, she stated pt wanted to wait til after he had his appt with PCP on 02/09, please advise.

## 2021-12-27 NOTE — Telephone Encounter (Signed)
Called [place] left VM giving orders to push back PT until 12/29/2021.  KP

## 2021-12-28 ENCOUNTER — Other Ambulatory Visit: Payer: Self-pay

## 2021-12-28 ENCOUNTER — Ambulatory Visit (INDEPENDENT_AMBULATORY_CARE_PROVIDER_SITE_OTHER): Payer: Medicare Other | Admitting: Family Medicine

## 2021-12-28 ENCOUNTER — Encounter: Payer: Self-pay | Admitting: Family Medicine

## 2021-12-28 VITALS — BP 120/60 | HR 62 | Ht 69.0 in | Wt 192.0 lb

## 2021-12-28 DIAGNOSIS — Z09 Encounter for follow-up examination after completed treatment for conditions other than malignant neoplasm: Secondary | ICD-10-CM | POA: Diagnosis not present

## 2021-12-28 DIAGNOSIS — N390 Urinary tract infection, site not specified: Secondary | ICD-10-CM

## 2021-12-28 DIAGNOSIS — N1832 Chronic kidney disease, stage 3b: Secondary | ICD-10-CM | POA: Diagnosis not present

## 2021-12-28 DIAGNOSIS — R7401 Elevation of levels of liver transaminase levels: Secondary | ICD-10-CM

## 2021-12-28 DIAGNOSIS — B952 Enterococcus as the cause of diseases classified elsewhere: Secondary | ICD-10-CM
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
## 2021-12-29 LAB — CBC WITH DIFFERENTIAL/PLATELET
Basophils Absolute: 0.1 10*3/uL (ref 0.0–0.2)
Basos: 2 %
EOS (ABSOLUTE): 0.5 10*3/uL — ABNORMAL HIGH (ref 0.0–0.4)
Eos: 7 %
Hematocrit: 44.6 % (ref 37.5–51.0)
Hemoglobin: 14.5 g/dL (ref 13.0–17.7)
Immature Grans (Abs): 0.1 10*3/uL (ref 0.0–0.1)
Immature Granulocytes: 1 %
Lymphocytes Absolute: 1.4 10*3/uL (ref 0.7–3.1)
Lymphs: 19 %
MCH: 27.9 pg (ref 26.6–33.0)
MCHC: 32.5 g/dL (ref 31.5–35.7)
MCV: 86 fL (ref 79–97)
Monocytes Absolute: 0.5 10*3/uL (ref 0.1–0.9)
Monocytes: 8 %
Neutrophils Absolute: 4.6 10*3/uL (ref 1.4–7.0)
Neutrophils: 63 %
Platelets: 271 10*3/uL (ref 150–450)
RBC: 5.19 x10E6/uL (ref 4.14–5.80)
RDW: 13.5 % (ref 11.6–15.4)
WBC: 7.2 10*3/uL (ref 3.4–10.8)

## 2021-12-29 LAB — RENAL FUNCTION PANEL
Albumin: 4.3 g/dL (ref 3.7–4.7)
BUN/Creatinine Ratio: 11 (ref 10–24)
BUN: 26 mg/dL (ref 8–27)
CO2: 20 mmol/L (ref 20–29)
Calcium: 9.9 mg/dL (ref 8.6–10.2)
Chloride: 99 mmol/L (ref 96–106)
Creatinine, Ser: 2.45 mg/dL — ABNORMAL HIGH (ref 0.76–1.27)
Glucose: 91 mg/dL (ref 70–99)
Phosphorus: 4.1 mg/dL (ref 2.8–4.1)
Potassium: 4.8 mmol/L (ref 3.5–5.2)
Sodium: 135 mmol/L (ref 134–144)
eGFR: 27 mL/min/{1.73_m2} — ABNORMAL LOW (ref >59)

## 2021-12-29 LAB — HEPATIC FUNCTION PANEL (6)
ALT: 28 IU/L (ref 0–44)
AST: 25 IU/L (ref 0–40)
Alkaline Phosphatase: 68 IU/L (ref 44–121)
Bilirubin Total: 0.6 mg/dL (ref 0.0–1.2)
Bilirubin, Direct: 0.19 mg/dL (ref 0.00–0.40)

## 2022-01-01 ENCOUNTER — Emergency Department: Payer: Medicare Other

## 2022-01-01 ENCOUNTER — Other Ambulatory Visit: Payer: Self-pay

## 2022-01-01 ENCOUNTER — Inpatient Hospital Stay: Payer: Medicare Other

## 2022-01-01 ENCOUNTER — Inpatient Hospital Stay: Payer: Medicare Other | Admitting: Family Medicine

## 2022-01-01 ENCOUNTER — Encounter: Payer: Self-pay | Admitting: Emergency Medicine

## 2022-01-01 ENCOUNTER — Inpatient Hospital Stay
Admission: EM | Admit: 2022-01-01 | Discharge: 2022-01-04 | DRG: 070 | Disposition: A | Discharge status: Home Health | Payer: Medicare Other | Attending: Internal Medicine | Admitting: Internal Medicine

## 2022-01-01 DIAGNOSIS — R739 Hyperglycemia, unspecified: Secondary | ICD-10-CM | POA: Diagnosis present

## 2022-01-01 DIAGNOSIS — R5381 Other malaise: Secondary | ICD-10-CM

## 2022-01-01 DIAGNOSIS — E785 Hyperlipidemia, unspecified: Secondary | ICD-10-CM | POA: Diagnosis present

## 2022-01-01 DIAGNOSIS — K219 Gastro-esophageal reflux disease without esophagitis: Secondary | ICD-10-CM | POA: Diagnosis present

## 2022-01-01 DIAGNOSIS — D696 Thrombocytopenia, unspecified: Secondary | ICD-10-CM | POA: Diagnosis present

## 2022-01-01 DIAGNOSIS — Z8673 Personal history of transient ischemic attack (TIA), and cerebral infarction without residual deficits: Secondary | ICD-10-CM

## 2022-01-01 DIAGNOSIS — Z960 Presence of urogenital implants: Secondary | ICD-10-CM | POA: Diagnosis present

## 2022-01-01 DIAGNOSIS — Z79899 Other long term (current) drug therapy: Secondary | ICD-10-CM

## 2022-01-01 DIAGNOSIS — Z888 Allergy status to other drugs, medicaments and biological substances status: Secondary | ICD-10-CM

## 2022-01-01 DIAGNOSIS — E871 Hypo-osmolality and hyponatremia: Secondary | ICD-10-CM | POA: Diagnosis present

## 2022-01-01 DIAGNOSIS — J90 Pleural effusion, not elsewhere classified: Secondary | ICD-10-CM | POA: Diagnosis not present

## 2022-01-01 DIAGNOSIS — Z85828 Personal history of other malignant neoplasm of skin: Secondary | ICD-10-CM

## 2022-01-01 DIAGNOSIS — Z87442 Personal history of urinary calculi: Secondary | ICD-10-CM

## 2022-01-01 DIAGNOSIS — R4182 Altered mental status, unspecified: Secondary | ICD-10-CM | POA: Diagnosis present

## 2022-01-01 DIAGNOSIS — J9601 Acute respiratory failure with hypoxia: Secondary | ICD-10-CM | POA: Diagnosis present

## 2022-01-01 DIAGNOSIS — I639 Cerebral infarction, unspecified: Secondary | ICD-10-CM | POA: Diagnosis not present

## 2022-01-01 DIAGNOSIS — Z955 Presence of coronary angioplasty implant and graft: Secondary | ICD-10-CM | POA: Diagnosis not present

## 2022-01-01 DIAGNOSIS — Z20822 Contact with and (suspected) exposure to covid-19: Secondary | ICD-10-CM | POA: Diagnosis present

## 2022-01-01 DIAGNOSIS — N1832 Chronic kidney disease, stage 3b: Secondary | ICD-10-CM | POA: Diagnosis present

## 2022-01-01 DIAGNOSIS — M47812 Spondylosis without myelopathy or radiculopathy, cervical region: Secondary | ICD-10-CM | POA: Diagnosis not present

## 2022-01-01 DIAGNOSIS — G9340 Encephalopathy, unspecified: Secondary | ICD-10-CM

## 2022-01-01 DIAGNOSIS — G9341 Metabolic encephalopathy: Secondary | ICD-10-CM | POA: Diagnosis present

## 2022-01-01 DIAGNOSIS — J9690 Respiratory failure, unspecified, unspecified whether with hypoxia or hypercapnia: Secondary | ICD-10-CM | POA: Diagnosis not present

## 2022-01-01 DIAGNOSIS — Z8719 Personal history of other diseases of the digestive system: Secondary | ICD-10-CM | POA: Diagnosis not present

## 2022-01-01 DIAGNOSIS — Z4682 Encounter for fitting and adjustment of non-vascular catheter: Secondary | ICD-10-CM | POA: Diagnosis not present

## 2022-01-01 DIAGNOSIS — E876 Hypokalemia: Secondary | ICD-10-CM | POA: Diagnosis not present

## 2022-01-01 DIAGNOSIS — G4733 Obstructive sleep apnea (adult) (pediatric): Secondary | ICD-10-CM | POA: Diagnosis present

## 2022-01-01 DIAGNOSIS — I129 Hypertensive chronic kidney disease with stage 1 through stage 4 chronic kidney disease, or unspecified chronic kidney disease: Secondary | ICD-10-CM | POA: Diagnosis present

## 2022-01-01 DIAGNOSIS — I2510 Atherosclerotic heart disease of native coronary artery without angina pectoris: Secondary | ICD-10-CM | POA: Diagnosis present

## 2022-01-01 DIAGNOSIS — R000 Tachycardia, unspecified: Secondary | ICD-10-CM | POA: Diagnosis not present

## 2022-01-01 DIAGNOSIS — Z7982 Long term (current) use of aspirin: Secondary | ICD-10-CM | POA: Diagnosis not present

## 2022-01-01 DIAGNOSIS — I6523 Occlusion and stenosis of bilateral carotid arteries: Secondary | ICD-10-CM | POA: Diagnosis not present

## 2022-01-01 DIAGNOSIS — N3090 Cystitis, unspecified without hematuria: Secondary | ICD-10-CM | POA: Diagnosis present

## 2022-01-01 DIAGNOSIS — Z9049 Acquired absence of other specified parts of digestive tract: Secondary | ICD-10-CM | POA: Diagnosis not present

## 2022-01-01 DIAGNOSIS — R410 Disorientation, unspecified: Secondary | ICD-10-CM | POA: Diagnosis not present

## 2022-01-01 DIAGNOSIS — N179 Acute kidney failure, unspecified: Secondary | ICD-10-CM | POA: Diagnosis present

## 2022-01-01 DIAGNOSIS — I6359 Cerebral infarction due to unspecified occlusion or stenosis of other cerebral artery: Secondary | ICD-10-CM | POA: Diagnosis not present

## 2022-01-01 LAB — URINALYSIS, ROUTINE W REFLEX MICROSCOPIC
Bilirubin Urine: NEGATIVE
Glucose, UA: NEGATIVE mg/dL
Ketones, ur: NEGATIVE mg/dL
Nitrite: NEGATIVE
Protein, ur: 100 mg/dL — AB
RBC / HPF: >50 RBC/hpf — ABNORMAL HIGH (ref 0–5)
Specific Gravity, Urine: 1.013 (ref 1.005–1.030)
pH: 5 (ref 5.0–8.0)

## 2022-01-01 LAB — PROTIME-INR
INR: 1 (ref 0.8–1.2)
Prothrombin Time: 13.3 seconds (ref 11.4–15.2)

## 2022-01-01 LAB — CBC
HCT: 42.5 % (ref 39.0–52.0)
Hemoglobin: 13.9 g/dL (ref 13.0–17.0)
MCH: 28.4 pg (ref 26.0–34.0)
MCHC: 32.7 g/dL (ref 30.0–36.0)
MCV: 86.9 fL (ref 80.0–100.0)
Platelets: 127 10*3/uL — ABNORMAL LOW (ref 150–400)
RBC: 4.89 MIL/uL (ref 4.22–5.81)
RDW: 13.4 % (ref 11.5–15.5)
WBC: 5.7 10*3/uL (ref 4.0–10.5)
nRBC: 0 % (ref 0.0–0.2)

## 2022-01-01 LAB — BLOOD GAS, ARTERIAL
Acid-base deficit: 6.8 mmol/L — ABNORMAL HIGH (ref 0.0–2.0)
Bicarbonate: 17.3 mmol/L — ABNORMAL LOW (ref 20.0–28.0)
FIO2: 0.45
MECHVT: 500 mL
O2 Saturation: 99.6 %
PEEP: 5 cmH2O
Patient temperature: 37
RATE: 18 resp/min
pCO2 arterial: 30 mmHg — ABNORMAL LOW (ref 32.0–48.0)
pH, Arterial: 7.37 (ref 7.350–7.450)
pO2, Arterial: 191 mmHg — ABNORMAL HIGH (ref 83.0–108.0)

## 2022-01-01 LAB — DIFFERENTIAL
Abs Immature Granulocytes: 0.05 10*3/uL (ref 0.00–0.07)
Basophils Absolute: 0.1 10*3/uL (ref 0.0–0.1)
Basophils Relative: 1 %
Eosinophils Absolute: 0.2 10*3/uL (ref 0.0–0.5)
Eosinophils Relative: 4 %
Immature Granulocytes: 1 %
Lymphocytes Relative: 18 %
Lymphs Abs: 1 10*3/uL (ref 0.7–4.0)
Monocytes Absolute: 0.4 10*3/uL (ref 0.1–1.0)
Monocytes Relative: 7 %
Neutro Abs: 3.9 10*3/uL (ref 1.7–7.7)
Neutrophils Relative %: 69 %

## 2022-01-01 LAB — URINE DRUG SCREEN, QUALITATIVE (ARMC ONLY)
Amphetamines, Ur Screen: NOT DETECTED
Barbiturates, Ur Screen: NOT DETECTED
Benzodiazepine, Ur Scrn: POSITIVE — AB
Cannabinoid 50 Ng, Ur ~~LOC~~: NOT DETECTED
Cocaine Metabolite,Ur ~~LOC~~: NOT DETECTED
MDMA (Ecstasy)Ur Screen: NOT DETECTED
Methadone Scn, Ur: NOT DETECTED
Opiate, Ur Screen: NOT DETECTED
Phencyclidine (PCP) Ur S: NOT DETECTED
Tricyclic, Ur Screen: NOT DETECTED

## 2022-01-01 LAB — COMPREHENSIVE METABOLIC PANEL
ALT: 23 U/L (ref 0–44)
AST: 31 U/L (ref 15–41)
Albumin: 3.8 g/dL (ref 3.5–5.0)
Alkaline Phosphatase: 60 U/L (ref 38–126)
Anion gap: 11 (ref 5–15)
BUN: 26 mg/dL — ABNORMAL HIGH (ref 8–23)
CO2: 18 mmol/L — ABNORMAL LOW (ref 22–32)
Calcium: 9.1 mg/dL (ref 8.9–10.3)
Chloride: 103 mmol/L (ref 98–111)
Creatinine, Ser: 2.2 mg/dL — ABNORMAL HIGH (ref 0.61–1.24)
GFR, Estimated: 30 mL/min — ABNORMAL LOW (ref >60)
Glucose, Bld: 116 mg/dL — ABNORMAL HIGH (ref 70–99)
Potassium: 3.4 mmol/L — ABNORMAL LOW (ref 3.5–5.1)
Sodium: 132 mmol/L — ABNORMAL LOW (ref 135–145)
Total Bilirubin: 1.1 mg/dL (ref 0.3–1.2)
Total Protein: 7.7 g/dL (ref 6.5–8.1)

## 2022-01-01 LAB — LACTIC ACID, PLASMA
Lactic Acid, Venous: 1.8 mmol/L (ref 0.5–1.9)
Lactic Acid, Venous: 1.6 mmol/L (ref 0.5–1.9)

## 2022-01-01 LAB — APTT: aPTT: 32 seconds (ref 24–36)

## 2022-01-01 LAB — RESP PANEL BY RT-PCR (FLU A&B, COVID) ARPGX2
Influenza A by PCR: NEGATIVE
Influenza B by PCR: NEGATIVE
SARS Coronavirus 2 by RT PCR: NEGATIVE

## 2022-01-01 LAB — MRSA NEXT GEN BY PCR, NASAL: MRSA by PCR Next Gen: NOT DETECTED

## 2022-01-01 LAB — SODIUM, URINE, RANDOM: Sodium, Ur: 40 mmol/L

## 2022-01-01 LAB — OSMOLALITY: Osmolality: 292 mOsm/kg (ref 275–295)

## 2022-01-01 LAB — SALICYLATE LEVEL: Salicylate Lvl: <7 mg/dL — ABNORMAL LOW (ref 7.0–30.0)

## 2022-01-01 LAB — ETHANOL: Alcohol, Ethyl (B): <10 mg/dL (ref <10)

## 2022-01-01 LAB — PROCALCITONIN: Procalcitonin: <0.1 ng/mL

## 2022-01-01 LAB — OSMOLALITY, URINE: Osmolality, Ur: 368 mOsm/kg (ref 300–900)

## 2022-01-01 LAB — CK: Total CK: 242 U/L (ref 49–397)

## 2022-01-01 LAB — GLUCOSE, CAPILLARY: Glucose-Capillary: 99 mg/dL (ref 70–99)

## 2022-01-01 LAB — TROPONIN I (HIGH SENSITIVITY): Troponin I (High Sensitivity): 7 ng/L (ref <18)

## 2022-01-01 LAB — ACETAMINOPHEN LEVEL: Acetaminophen (Tylenol), Serum: <10 ug/mL — ABNORMAL LOW (ref 10–30)

## 2022-01-01 MED ORDER — IPRATROPIUM-ALBUTEROL 0.5-2.5 (3) MG/3ML IN SOLN: 3.0000 mL | RESPIRATORY_TRACT | Status: DC | PRN

## 2022-01-01 MED ORDER — DOCUSATE SODIUM 100 MG PO CAPS: 100.0000 mg | ORAL_CAPSULE | Freq: Two times a day (BID) | ORAL | Status: DC | PRN

## 2022-01-01 MED ORDER — POTASSIUM CHLORIDE 20 MEQ PO PACK
20.0000 meq | PACK | Freq: Once | ORAL | Status: AC
  Administered 2022-01-01: 20 meq
  Filled 2022-01-01: qty 1

## 2022-01-01 MED ORDER — ORAL CARE MOUTH RINSE
15.0000 mL | OROMUCOSAL | Status: DC
  Administered 2022-01-01 – 2022-01-02 (×5): 15 mL via OROMUCOSAL

## 2022-01-01 MED ORDER — ROCURONIUM BROMIDE 10 MG/ML (PF) SYRINGE
PREFILLED_SYRINGE | INTRAVENOUS | Status: AC
  Administered 2022-01-01: 100 mg via INTRAVENOUS
  Filled 2022-01-01: qty 10

## 2022-01-01 MED ORDER — CHLORHEXIDINE GLUCONATE 0.12% ORAL RINSE (MEDLINE KIT)
15.0000 mL | Freq: Two times a day (BID) | OROMUCOSAL | Status: DC
  Administered 2022-01-01 – 2022-01-02 (×2): 15 mL via OROMUCOSAL

## 2022-01-01 MED ORDER — POLYETHYLENE GLYCOL 3350 17 G PO PACK: 17.0000 g | PACK | Freq: Every day | ORAL | Status: DC | PRN

## 2022-01-01 MED ORDER — FENTANYL BOLUS VIA INFUSION
25.0000 ug | INTRAVENOUS | Status: DC | PRN
  Administered 2022-01-01: 100 ug via INTRAVENOUS
  Filled 2022-01-01: qty 100

## 2022-01-01 MED ORDER — MIDAZOLAM HCL 2 MG/2ML IJ SOLN: 1.0000 mg | INTRAMUSCULAR | Status: DC | PRN

## 2022-01-01 MED ORDER — INSULIN ASPART 100 UNIT/ML IJ SOLN: 0.0000 [IU] | INTRAMUSCULAR | Status: DC

## 2022-01-01 MED ORDER — POLYETHYLENE GLYCOL 3350 17 G PO PACK
17.0000 g | PACK | Freq: Every day | ORAL | Status: DC
  Filled 2022-01-01: qty 1

## 2022-01-01 MED ORDER — SODIUM CHLORIDE 0.9 % IV BOLUS
1000.0000 mL | Freq: Once | INTRAVENOUS | Status: AC
  Administered 2022-01-01: 1000 mL via INTRAVENOUS

## 2022-01-01 MED ORDER — PROPOFOL BOLUS VIA INFUSION
40.0000 mg | Freq: Once | INTRAVENOUS | Status: AC
  Administered 2022-01-01: 40 mg via INTRAVENOUS
  Filled 2022-01-01: qty 40

## 2022-01-01 MED ORDER — FENTANYL 2500MCG IN NS 250ML (10MCG/ML) PREMIX INFUSION
25.0000 ug/h | INTRAVENOUS | Status: DC
  Administered 2022-01-01: 25 ug/h via INTRAVENOUS
  Filled 2022-01-01: qty 250

## 2022-01-01 MED ORDER — DOCUSATE SODIUM 50 MG/5ML PO LIQD
100.0000 mg | Freq: Two times a day (BID) | ORAL | Status: DC
  Administered 2022-01-01 – 2022-01-04 (×3): 100 mg
  Filled 2022-01-01 (×6): qty 10

## 2022-01-01 MED ORDER — PANTOPRAZOLE SODIUM 40 MG IV SOLR
40.0000 mg | Freq: Every day | INTRAVENOUS | Status: DC
  Administered 2022-01-01 – 2022-01-04 (×4): 40 mg via INTRAVENOUS
  Filled 2022-01-01 (×4): qty 10

## 2022-01-01 MED ORDER — SODIUM CHLORIDE 0.9% FLUSH: 3.0000 mL | INTRAVENOUS | Status: DC | PRN

## 2022-01-01 MED ORDER — HALOPERIDOL LACTATE 5 MG/ML IJ SOLN: 5.0000 mg | Freq: Once | INTRAMUSCULAR | Status: AC

## 2022-01-01 MED ORDER — SODIUM CHLORIDE 0.9% FLUSH
3.0000 mL | Freq: Two times a day (BID) | INTRAVENOUS | Status: DC
  Administered 2022-01-01: 3 mL via INTRAVENOUS

## 2022-01-01 MED ORDER — KETAMINE HCL 10 MG/ML IJ SOLN: 100.0000 mg | Freq: Once | INTRAMUSCULAR | Status: AC

## 2022-01-01 MED ORDER — SODIUM CHLORIDE 0.9 % IV SOLN
2.0000 g | INTRAVENOUS | Status: DC
  Administered 2022-01-02 – 2022-01-04 (×2): 2 g via INTRAVENOUS
  Filled 2022-01-01: qty 20
  Filled 2022-01-01 (×2): qty 2

## 2022-01-01 MED ORDER — SODIUM CHLORIDE 0.9 % IV SOLN
1.0000 g | Freq: Once | INTRAVENOUS | Status: AC
  Administered 2022-01-01: 1 g via INTRAVENOUS
  Filled 2022-01-01: qty 10

## 2022-01-01 MED ORDER — KETAMINE HCL 10 MG/ML IJ SOLN
INTRAMUSCULAR | Status: AC
  Administered 2022-01-01: 100 mg via INTRAVENOUS
  Filled 2022-01-01: qty 1

## 2022-01-01 MED ORDER — CHLORHEXIDINE GLUCONATE CLOTH 2 % EX PADS
6.0000 | MEDICATED_PAD | Freq: Every day | CUTANEOUS | Status: DC
  Administered 2022-01-01 – 2022-01-03 (×3): 6 via TOPICAL

## 2022-01-01 MED ORDER — SODIUM CHLORIDE 0.9 % IV SOLN: INTRAVENOUS | Status: DC

## 2022-01-01 MED ORDER — SODIUM CHLORIDE 0.9 % IV SOLN: 250.0000 mL | INTRAVENOUS | Status: DC | PRN

## 2022-01-01 MED ORDER — HEPARIN SODIUM (PORCINE) 5000 UNIT/ML IJ SOLN
5000.0000 [IU] | Freq: Three times a day (TID) | INTRAMUSCULAR | Status: DC
  Filled 2022-01-01: qty 1

## 2022-01-01 MED ORDER — SODIUM CHLORIDE 0.9 % IV BOLUS (SEPSIS)
1000.0000 mL | Freq: Once | INTRAVENOUS | Status: AC
  Administered 2022-01-01: 1000 mL via INTRAVENOUS

## 2022-01-01 MED ORDER — ROCURONIUM BROMIDE 10 MG/ML (PF) SYRINGE: 100.0000 mg | PREFILLED_SYRINGE | Freq: Once | INTRAVENOUS | Status: AC

## 2022-01-01 MED ORDER — HALOPERIDOL LACTATE 5 MG/ML IJ SOLN
INTRAMUSCULAR | Status: AC
  Filled 2022-01-01: qty 1

## 2022-01-01 MED ORDER — PROPOFOL 1000 MG/100ML IV EMUL
5.0000 ug/kg/min | INTRAVENOUS | Status: DC
  Administered 2022-01-01: 75 ug/kg/min via INTRAVENOUS
  Administered 2022-01-01: 5 ug/kg/min via INTRAVENOUS
  Administered 2022-01-01: 50 ug/kg/min via INTRAVENOUS
  Administered 2022-01-02: 40 ug/kg/min via INTRAVENOUS
  Administered 2022-01-02: 25 ug/kg/min via INTRAVENOUS
  Filled 2022-01-01 (×6): qty 100

## 2022-01-01 MED ORDER — HALOPERIDOL LACTATE 5 MG/ML IJ SOLN
5.0000 mg | Freq: Once | INTRAMUSCULAR | Status: AC
  Administered 2022-01-01: 5 mg via INTRAMUSCULAR

## 2022-01-01 MED ORDER — HALOPERIDOL LACTATE 5 MG/ML IJ SOLN
INTRAMUSCULAR | Status: AC
  Administered 2022-01-01: 5 mg via INTRAMUSCULAR
  Filled 2022-01-01: qty 1

## 2022-01-01 MED ORDER — IOHEXOL 350 MG/ML SOLN
75.0000 mL | Freq: Once | INTRAVENOUS | Status: AC | PRN
  Administered 2022-01-01: 75 mL via INTRAVENOUS

## 2022-01-01 NOTE — Progress Notes (Signed)
PHARMACY CONSULT NOTE - FOLLOW UP  Pharmacy Consult for Electrolyte Monitoring and Replacement   Recent Labs: Potassium (mmol/L)  Date Value  01/01/2022 3.4 (L)  11/11/2014 3.4 (L)   Magnesium (mg/dL)  Date Value  12/13/2021 2.5 (H)   Calcium (mg/dL)  Date Value  01/01/2022 9.1   Calcium, Total (mg/dL)  Date Value  11/11/2014 8.7   Albumin (g/dL)  Date Value  01/01/2022 3.8  12/28/2021 4.3  04/18/2012 4.2   Phosphorus (mg/dL)  Date Value  12/28/2021 4.1   Sodium (mmol/L)  Date Value  01/01/2022 132 (L)  12/28/2021 135  11/11/2014 136     Assessment: 76yo male who presented to ED with AMS. Pharmacy has been consulted for electrolyte replacement. Pt intubated 2/13.  Goal of Therapy:  Electrolytes WNL  Plan:  K 3.4 - will order 20 mEq Kcl packet per tube x 1 dose Re-check electrolytes with AM labs  Sherilyn Banker ,PharmD Clinical Pharmacist 01/01/2022 5:19 PM

## 2022-01-01 NOTE — ED Notes (Signed)
Mortimer Fries, MD and Dewaine Conger, NP at bedside assessing patient.

## 2022-01-01 NOTE — Progress Notes (Signed)
ET tube 6cm above carina on Xray, ET tube at 24cm. Inserted ET tube to 26cm at lip, equal breath sounds.

## 2022-01-01 NOTE — Progress Notes (Signed)
GOALS OF CARE DISCUSSION  The Clinical status was relayed to family in detail- Son in family room in the ER Updated and notified of patients medical condition- Patient remains unresponsive and will not open eyes to command.   Patient is having a weak cough and struggling to remove secretions.   Patient with increased WOB and using accessory muscles to breathe Explained to family course of therapy and the modalities    Patient with Progressive multiorgan failure with a very high probablity of a very minimal chance of meaningful recovery despite all aggressive and optimal medical therapy.  PATIENT REMAINS FULL CODE  Family understands the situation.   Family are satisfied with Plan of action and management. All questions answered  Additional CC time 25 mins   Elizah Mierzwa Patricia Pesa, M.D.  [facility]  Medical Director [place] Director [facility] Cardio-Pulmonary Department

## 2022-01-01 NOTE — ED Notes (Signed)
Unable to complete stroke swallow screen at this time due to pt intubation.

## 2022-01-01 NOTE — ED Notes (Signed)
Called [facility] to activate code stroke spoke to [facility]  [phone]

## 2022-01-01 NOTE — Progress Notes (Signed)
CODE STROKE- PHARMACY COMMUNICATION   Time CODE STROKE called/page received: [id number]  Time response to CODE STROKE was made (in person or via phone): 1532  Time Stroke Kit retrieved from [place] (only if needed): no TNK  Name of Provider/Nurse contacted: Dr. Theda Sers  Past Medical History:  Diagnosis Date   Abnormal cardiovascular stress test 11/15/2015   Arteriosclerosis of coronary artery 11/16/2014   Overview:  Sp pci stetn of lad 2015    Benign essential HTN 11/16/2014   Cancer ([place]) 2001   skin cancer on face   Chest pain 11/03/2014   Combined fat and carbohydrate induced hyperlipemia 26/71/2458   Complication of anesthesia 10/2014   severe head ache with cardiac stent placement. Refered to neurologist.   Depression    GERD (gastroesophageal reflux disease)    History of kidney stones    Hyperlipidemia    Hypertension    Kidney stones    x 20 years   MI (mitral incompetence) 04/20/2014   Sleep apnea    CPAP   Stroke ([place]) 2020   Prior to Admission medications   Medication Sig Start Date End Date Taking? Authorizing Provider  aspirin EC 81 MG tablet Take 81 mg by mouth daily. Swallow whole.   Yes [provider]  citalopram (CELEXA) 40 MG tablet Take 1 tablet (40 mg total) by mouth daily. 07/17/21  Yes Juline Patch, MD  diltiazem (CARDIZEM CD) 180 MG 24 hr capsule Take 1 capsule (180 mg total) by mouth daily. 12/22/21 01/21/22 Yes Anderson, Carlynn Herald, MD  Flaxseed, Linseed, (FLAXSEED OIL) 1200 MG CAPS Take 1,200 mg by mouth daily.   Yes [provider]  Melatonin 3 MG CAPS Take 3 mg by mouth at bedtime.   Yes [provider]  metoprolol succinate (TOPROL-XL) 25 MG 24 hr tablet Take 1 tablet (25 mg total) by mouth at bedtime. 07/17/21  Yes Juline Patch, MD  Multiple Vitamin (MULTIVITAMIN WITH MINERALS) TABS tablet Take 1 tablet by mouth daily.   Yes [provider]  pantoprazole (PROTONIX) 40 MG tablet TAKE 1 TABLET TWICE A DAY BEFORE MEALS  12/06/21  Yes Juline Patch, MD  rosuvastatin (CRESTOR) 20 MG tablet Take 1 tablet (20 mg total) by mouth at bedtime. 07/17/21  Yes Juline Patch, MD  acetaminophen (TYLENOL) 500 MG tablet Take 1,000 mg by mouth every 6 (six) hours as needed for moderate pain.    [provider]  Carboxymethylcellul-Glycerin (LUBRICATING EYE DROPS OP) Place 1 drop into both eyes daily as needed (dry eyes).    [provider]  cetirizine (ZYRTEC) 10 MG tablet Take 10 mg by mouth as needed for allergies.    [provider]  hydrocortisone cream 1 % Apply 1 application topically daily as needed for itching.    [provider]  trospium (SANCTURA) 20 MG tablet Take 1 tablet (20 mg total) by mouth 2 (two) times daily as needed (Urinary frequency, urgency, bladder spasms/stent irritation). Patient not taking: Reported on 01/01/2022 12/05/21   Abbie Sons, MD    Sherilyn Banker ,PharmD Clinical Pharmacist  01/01/2022  4:18 PM

## 2022-01-01 NOTE — ED Notes (Signed)
Informed RN bed assigned 

## 2022-01-01 NOTE — ED Provider Notes (Signed)
[facility] Provider Note    Event Date/Time   First MD Initiated Contact with Patient 01/01/22 1530     (approximate)   History   Altered Mental Status  HPI Luis Strong is a 76 y.o. male with unknown past medical history who presents via EMS for altered mental status.  Per patient's son, he was in his normal state of health at approximately noon today when he went to lunch and within the next hour began with nonsense speech, agitation, and abnormal behavior including trying to get out of a car while it was moving and taking his shirt off.  Patient arrives with babbling incoherent speech and inability to be redirected or answer questions.     Physical Exam   Triage Vital Signs: ED Triage Vitals  Enc Vitals Group     BP 01/01/22 1509 (!) 158/83     Pulse Rate 01/01/22 1509 (!) 101     Resp 01/01/22 1513 15     Temp 01/01/22 1526 (!) 94.8 F (34.9 C)     Temp src --      SpO2 01/01/22 1509 97 %     Weight 01/01/22 1439 208 lb 6.4 oz (94.5 kg)     Height 01/01/22 1439 5' 9"  (1.753 m)     Head Circumference --      Peak Flow --      Pain Score 01/01/22 1439 0     Pain Loc --      Pain Edu? --      Excl. in [place]? --     Most recent vital signs: Vitals:   01/02/22 0500 01/02/22 0600  BP: 95/61 108/60  Pulse: 82 84  Resp: 18 15  Temp:    SpO2: 97% 96%    General: Awake, constantly verbal with nonsense speech CV:  Good peripheral perfusion.  Resp:  Increased effort.  Abd:  No distention.  Other:  Elderly Caucasian male sitting in bed constantly moving and mumbling nonsense speech.  Moving all extremities spontaneously   ED Results / Procedures / Treatments   Labs (all labs ordered are listed, but only abnormal results are displayed) Labs Reviewed  CBC - Abnormal; Notable for the following components:      Result Value   Platelets 127 (*)    All other components within normal limits  COMPREHENSIVE METABOLIC PANEL - Abnormal;  Notable for the following components:   Sodium 132 (*)    Potassium 3.4 (*)    CO2 18 (*)    Glucose, Bld 116 (*)    BUN 26 (*)    Creatinine, Ser 2.20 (*)    GFR, Estimated 30 (*)    All other components within normal limits  URINE DRUG SCREEN, QUALITATIVE ([facility] ONLY) - Abnormal; Notable for the following components:   Benzodiazepine, Ur Scrn POSITIVE (*)    All other components within normal limits  URINALYSIS, ROUTINE W REFLEX MICROSCOPIC - Abnormal; Notable for the following components:   Color, Urine YELLOW (*)    APPearance CLOUDY (*)    Hgb urine dipstick LARGE (*)    Protein, ur 100 (*)    Leukocytes,Ua TRACE (*)    RBC / HPF >50 (*)    Bacteria, UA FEW (*)    All other components within normal limits  ACETAMINOPHEN LEVEL - Abnormal; Notable for the following components:   Acetaminophen (Tylenol), Serum <10 (*)    All other components within normal limits  SALICYLATE LEVEL -  Abnormal; Notable for the following components:   Salicylate Lvl <7.3 (*)    All other components within normal limits  BLOOD GAS, ARTERIAL - Abnormal; Notable for the following components:   pCO2 arterial 30 (*)    pO2, Arterial 191 (*)    Bicarbonate 17.3 (*)    Acid-base deficit 6.8 (*)    All other components within normal limits  CBC - Abnormal; Notable for the following components:   Hemoglobin 11.8 (*)    HCT 36.8 (*)    Platelets 135 (*)    All other components within normal limits  BASIC METABOLIC PANEL - Abnormal; Notable for the following components:   Sodium 134 (*)    CO2 21 (*)    Glucose, Bld 116 (*)    BUN 25 (*)    Creatinine, Ser 2.16 (*)    Calcium 8.1 (*)    GFR, Estimated 31 (*)    All other components within normal limits  BLOOD GAS, ARTERIAL - Abnormal; Notable for the following components:   pH, Arterial 7.49 (*)    pCO2 arterial 21 (*)    pO2, Arterial 184 (*)    Bicarbonate 16.0 (*)    Acid-base deficit 5.1 (*)    All other components within normal limits   GLUCOSE, CAPILLARY - Abnormal; Notable for the following components:   Glucose-Capillary 111 (*)    All other components within normal limits  GLUCOSE, CAPILLARY - Abnormal; Notable for the following components:   Glucose-Capillary 108 (*)    All other components within normal limits  GLUCOSE, CAPILLARY - Abnormal; Notable for the following components:   Glucose-Capillary 112 (*)    All other components within normal limits  RESP PANEL BY RT-PCR (FLU A&B, COVID) ARPGX2  MRSA NEXT GEN BY PCR, NASAL  CULTURE, BLOOD (ROUTINE X 2)  CULTURE, BLOOD (ROUTINE X 2)  URINE CULTURE  ETHANOL  PROTIME-INR  APTT  DIFFERENTIAL  LACTIC ACID, PLASMA  LACTIC ACID, PLASMA  PROCALCITONIN  CK  PROCALCITONIN  MAGNESIUM  PHOSPHORUS  OSMOLALITY  OSMOLALITY, URINE  SODIUM, URINE, RANDOM  GLUCOSE, CAPILLARY  GLUCOSE, CAPILLARY  PATHOLOGIST SMEAR REVIEW  HEPARIN INDUCED PLATELET AB (HIT ANTIBODY)  SEROTONIN RELEASE ASSAY (SRA)  THYROID PANEL WITH TSH  HEMOGLOBIN A1C  TROPONIN I (HIGH SENSITIVITY)     EKG ED ECG REPORT I, Naaman Plummer, the attending physician, personally viewed and interpreted this ECG.  Date: 01/01/2022 EKG Time: 1514 Rate: 130 Rhythm: Tachycardic sinus rhythm QRS Axis: normal Intervals: normal ST/T Wave abnormalities: normal Narrative Interpretation: Tachycardic sinus rhythm.  No evidence of acute ischemia   RADIOLOGY ED MD interpretation: Single view portable chest x-ray shows increased interstitial lung markings, blunting of the left costophrenic angle with trace pleural effusion and ET tube in place as well as orogastric tube below the diaphragm  CT without contrast of the head shows small perforator infarct in the basal ganglia that is new from previous scan on 2019 without any evidence of acute hemorrhage  Patient CT angiography of the head and neck shows no intracranial large vessel occlusion or proximal high-grade arterial stenosis  MRI brain without  contrast shows no acute intracranial abnormalities but does show generalized volume loss vascular ischemia -Agree with radiology assessment Official radiology report(s): MR BRAIN WO CONTRAST  Result Date: 01/02/2022 CLINICAL DATA:  Encephalopathy EXAM: MRI HEAD WITHOUT CONTRAST TECHNIQUE: Multiplanar, multiecho pulse sequences of the brain and surrounding structures were obtained without intravenous contrast. COMPARISON:  06/24/2018 FINDINGS: Brain: No acute  infarct, mass effect or extra-axial collection. No acute or chronic hemorrhage. There is multifocal hyperintense T2-weighted signal within the white matter. Generalized volume loss without a clear lobar predilection. The midline structures are normal. Vascular: Major flow voids are preserved. Skull and upper cervical spine: Normal calvarium and skull base. Visualized upper cervical spine and soft tissues are normal. Sinuses/Orbits:No paranasal sinus fluid levels or advanced mucosal thickening. No mastoid or middle ear effusion. Normal orbits. IMPRESSION: 1. No acute intracranial abnormality. 2. Generalized volume loss and findings of chronic microvascular ischemia. Electronically Signed   By: Ulyses Jarred M.D.   On: 01/02/2022 00:50   DG Chest Portable 1 View  Result Date: 01/01/2022 CLINICAL DATA:  AMS, intubation EXAM: PORTABLE CHEST 1 VIEW.  Patient is rotated. COMPARISON:  Chest x-ray 12/16/2021 FINDINGS: Endotracheal tube with tip terminating 6 cm above the carina. Enteric tube coursing below the hemidiaphragm with tip and side port overlying the expected region of the gastric lumen. The heart and mediastinal contours are unchanged. Atherosclerotic plaque. No focal consolidation. Similar increased interstitial markings. Blunting of left costophrenic angle with trace pleural effusion not excluded. No right pleural effusion. No pneumothorax. No acute osseous abnormality. IMPRESSION: 1. Persistent increased interstitial markings. 2. Blunting of left  costophrenic angle with trace pleural effusion not excluded. 3.  Aortic Atherosclerosis (ICD10-I70.0). Electronically Signed   By: Iven Finn M.D.   On: 01/01/2022 17:21   CT HEAD CODE STROKE WO CONTRAST  Result Date: 01/01/2022 CLINICAL DATA:  Code stroke.  Neuro deficit, acute, stroke suspected EXAM: CT HEAD WITHOUT CONTRAST TECHNIQUE: Contiguous axial images were obtained from the base of the skull through the vertex without intravenous contrast. RADIATION DOSE REDUCTION: This exam was performed according to the departmental dose-optimization program which includes automated exposure control, adjustment of the mA and/or kV according to patient size and/or use of iterative reconstruction technique. COMPARISON:  CT 06/23/2018 FINDINGS: Brain: Small remote appearing infarct in the right basal ganglia, new since 2019. No evidence of acute hemorrhage, mass lesion, midline shift, or hydrocephalus. Mild patchy white matter hypoattenuation, nonspecific but compatible with chronic microvascular ischemic disease. Mild atrophy. Vascular: No hyperdense vessel identified. Calcific intracranial atherosclerosis. Skull: No acute fracture. Sinuses/Orbits: Clear sinuses.  Unremarkable orbits Other: No mastoid effusions. IMPRESSION: 1. Small perforator infarct in the right basal ganglia, favored remote but new since 2019. An MRI could provide more sensitive evaluation for acute infarct if clinically warranted. 2. No acute hemorrhage. Findings discussed with Dr. Theda Sers via telephone at 4 p.m. Electronically Signed   By: Margaretha Sheffield M.D.   On: 01/01/2022 16:03   CT ANGIO HEAD NECK W WO CM (CODE STROKE)  Result Date: 01/01/2022 CLINICAL DATA:  Provided history: Neuro deficit, acute, stroke suspected. Additional history provided: Altered mental status. EXAM: CT ANGIOGRAPHY HEAD AND NECK TECHNIQUE: Multidetector CT imaging of the head and neck was performed using the standard protocol during bolus administration of  intravenous contrast. Multiplanar CT image reconstructions and MIPs were obtained to evaluate the vascular anatomy. Carotid stenosis measurements (when applicable) are obtained utilizing NASCET criteria, using the distal internal carotid diameter as the denominator. RADIATION DOSE REDUCTION: This exam was performed according to the departmental dose-optimization program which includes automated exposure control, adjustment of the mA and/or kV according to patient size and/or use of iterative reconstruction technique. CONTRAST:  85m OMNIPAQUE IOHEXOL 350 MG/ML SOLN COMPARISON:  Noncontrast head CT performed earlier today 01/01/2022. FINDINGS: CTA NECK FINDINGS Aortic arch: Standard aortic branching. The visualized aortic arch is  normal in caliber. Streak and beam hardening artifact arising from a dense left-sided contrast bolus partially obscures the left subclavian artery. Within this limitation, there is no appreciable hemodynamically significant stenosis within the innominate or proximal subclavian arteries. Right carotid system: CCA and ICA patent within the neck without significant stenosis (50% or greater). Mild atherosclerotic plaque about the carotid bifurcation and within the proximal ICA. Left carotid system: CCA and ICA patent within the neck. Mild to moderate atherosclerotic plaque about the carotid bifurcation and within the proximal ICA. Resultant less than 50% stenosis within the proximal ICA. Vertebral arteries: Vertebral arteries codominant and patent within the neck without stenosis. Skeleton: Cervical spondylosis. Partially imaged thoracic levocurvature. No acute bony abnormality or aggressive osseous lesion. Other neck: No neck mass or cervical lymphadenopathy. Upper chest: No consolidation within the imaged lung apices. Mild linear atelectasis versus scarring within the imaged lungs. The ET tube terminates within the thoracic trachea, above the level of the carina. Partially visualized enteric  tube. Review of the MIP images confirms the above findings CTA HEAD FINDINGS Anterior circulation: The intracranial internal carotid arteries are patent. Non-stenotic calcified plaque within both vessels. The M1 middle cerebral arteries are patent. No M2 proximal branch occlusion or high-grade proximal stenosis is identified. The anterior cerebral arteries are patent. No intracranial aneurysm is identified. No intracranial aneurysm is identified. Posterior circulation: The intracranial vertebral arteries are patent. The basilar artery is patent. The posterior cerebral arteries are patent. Posterior communicating arteries are diminutive or absent, bilaterally Venous sinuses: Within the limitations of contrast timing, no convincing thrombus. Anatomic variants: As described Review of the MIP images confirms the above findings No emergent large vessel occlusion identified. These results were communicated to Dr. Theda Sers At 4:38 pmon 2/13/2023by text page via the [facility] messaging system. IMPRESSION: CTA neck: The common carotid, internal carotid and vertebral arteries are patent within the neck without hemodynamically significant stenosis. Atherosclerotic plaque about the carotid bifurcations and within the proximal ICAs, left greater than right. CTA head: 1. No intracranial large vessel occlusion or proximal high-grade arterial stenosis identified. 2. Non-stenotic calcified plaque within the intracranial ICAs, bilaterally. Electronically Signed   By: Kellie Simmering D.O.   On: 01/01/2022 16:39    PROCEDURES:  Critical Care performed: Yes, see critical care procedure note(s)  Procedure Name: Intubation Date/Time: 01/02/2022 7:46 AM Performed by: Naaman Plummer, MD Pre-anesthesia Checklist: Patient identified, Patient being monitored, Emergency Drugs available, Timeout performed and Suction available Oxygen Delivery Method: Non-rebreather mask Preoxygenation: Pre-oxygenation with 100% oxygen Induction Type: Rapid  sequence Ventilation: Mask ventilation without difficulty Laryngoscope Size: Glidescope Grade View: Grade I Tube size: 7.5 mm Number of attempts: 1 Airway Equipment and Method: Video-laryngoscopy Placement Confirmation: ETT inserted through vocal cords under direct vision, CO2 detector and Breath sounds checked- equal and bilateral Secured at: 24 cm Tube secured with: ETT holder Dental Injury: Teeth and Oropharynx as per pre-operative assessment     .1-3 Lead EKG Interpretation Performed by: Naaman Plummer, MD Authorized by: Naaman Plummer, MD     Interpretation: normal     ECG rate:  83   ECG rate assessment: normal     Rhythm: sinus rhythm     Ectopy: none     Conduction: normal    MEDICATIONS ORDERED IN ED: Medications  haloperidol lactate (HALDOL) 5 MG/ML injection (  Not Given 01/02/22 0151)  propofol (DIPRIVAN) 1000 MG/100ML infusion (25 mcg/kg/min  94.5 kg Intravenous New Bag/Given 01/02/22 0708)  docusate (COLACE) 50 MG/5ML  liquid 100 mg (100 mg Per Tube Given 01/01/22 2121)  polyethylene glycol (MIRALAX / GLYCOLAX) packet 17 g (0 g Per Tube Hold 01/01/22 1730)  fentaNYL 2532mg in NS 2538m(1046mml) infusion-PREMIX (25 mcg/hr Intravenous Infusion Verify 01/02/22 0605)  fentaNYL (SUBLIMAZE) bolus via infusion 25-100 mcg (100 mcg Intravenous Bolus from Bag 01/01/22 1733)  midazolam (VERSED) injection 1 mg (has no administration in time range)  midazolam (VERSED) injection 1 mg (has no administration in time range)  polyethylene glycol (MIRALAX / GLYCOLAX) packet 17 g (has no administration in time range)  cefTRIAXone (ROCEPHIN) 2 g in sodium chloride 0.9 % 100 mL IVPB (has no administration in time range)  sodium chloride flush (NS) 0.9 % injection 3 mL (3 mLs Intravenous Given 01/01/22 2124)  sodium chloride flush (NS) 0.9 % injection 3 mL (has no administration in time range)  0.9 %  sodium chloride infusion (has no administration in time range)  Chlorhexidine Gluconate  Cloth 2 % PADS 6 each (6 each Topical Given 01/02/22 0525)  chlorhexidine gluconate (MEDLINE KIT) (PERIDEX) 0.12 % solution 15 mL (15 mLs Mouth Rinse Given 01/01/22 2000)  MEDLINE mouth rinse (15 mLs Mouth Rinse Given 01/02/22 0603)  0.9 %  sodium chloride infusion ( Intravenous Infusion Verify 01/02/22 0605)  insulin aspart (novoLOG) injection 0-9 Units (0 Units Subcutaneous Not Given 01/02/22 0400)  pantoprazole (PROTONIX) injection 40 mg (40 mg Intravenous Given 01/01/22 2121)  ipratropium-albuterol (DUONEB) 0.5-2.5 (3) MG/3ML nebulizer solution 3 mL (has no administration in time range)  magnesium sulfate IVPB 2 g 50 mL (has no administration in time range)  haloperidol lactate (HALDOL) injection 5 mg (5 mg Intramuscular Given 01/01/22 1439)  haloperidol lactate (HALDOL) injection 5 mg (5 mg Intramuscular Given 01/01/22 1454)  rocuronium bromide 10 mg/mL (PF) syringe (100 mg Intravenous Given 01/01/22 1510)  ketamine (KETALAR) injection 100 mg (100 mg Intravenous Given 01/01/22 1506)  sodium chloride 0.9 % bolus 1,000 mL (0 mLs Intravenous Stopped 01/01/22 1821)  sodium chloride 0.9 % bolus 1,000 mL (0 mLs Intravenous Stopped 01/01/22 1702)  iohexol (OMNIPAQUE) 350 MG/ML injection 75 mL (75 mLs Intravenous Contrast Given 01/01/22 1556)  cefTRIAXone (ROCEPHIN) 1 g in sodium chloride 0.9 % 100 mL IVPB (0 g Intravenous Stopped 01/01/22 1702)  propofol (DIPRIVAN) bolus via infusion 40 mg (40 mg Intravenous Bolus from Bag 01/01/22 1652)  potassium chloride (KLOR-CON) packet 20 mEq (20 mEq Per Tube Given 01/01/22 1824)     IMPRESSION / MDM / ASSESSMENT AND PLAN / ED COURSE  I reviewed the triage vital signs and the nursing notes.                              Differential diagnosis includes, but is not limited to, intoxication, hypercarbia, hypoxia, intracranial hemorrhage  The patient is on the cardiac monitor to evaluate for evidence of arrhythmia and/or significant heart rate changes.  Patient is  [age]ar old male who presents for altered mental status that was sudden onset over approximately 1 hour and has been unable to be redirected for any further evaluation.  Patient was given 10 mg of IM Haldol but continued to be agitated and unable to get an IV for further work-up.  The decision was then made to intubate, please see procedure note for full details.  A code stroke was called and patient was taken to our CT scanner that did not show any evidence of acute intracranial hemorrhage.  Dr. ColTheda Sersd  neurology assisted on this case and agree with recommendation for no thrombolytics at this time.  Patient's laboratory evaluation significant for urinalysis showing hemoglobin positive, trace leukocytes, and few bacteria.  Patient's metabolic panel shows elevated creatinine but is at patient's baseline.  CBC shows no evidence of of anemia or leukocytosis.  Given small amount of bacteria on urinalysis, will treat empirically for urinary tract infection as patient's son does state that he has similar symptoms during urinary tract infection previously.  I spoke to Dr. Mortimer Fries in critical care who agrees to accept this patient onto the ICU service for further evaluation and management.  Dispo: Admit to the ICU      FINAL CLINICAL IMPRESSION(S) / ED DIAGNOSES   Final diagnoses:  Cystitis  Acute encephalopathy     Rx / DC Orders   ED Discharge Orders     None        Note:  This document was prepared using Dragon voice recognition software and may include unintentional dictation errors.   Naaman Plummer, MD 01/02/22 [phone]
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
## 2022-01-01 NOTE — ED Notes (Signed)
Patient to CT with RN, stroke RN, and RT.

## 2022-01-01 NOTE — ED Triage Notes (Signed)
Patient to ED via [facility] from lawyers office for [place]. Patient went to lunch with friends and started appearing altered around 1130. Patient given 2mg  Versed by EMS with no change. Patient rambling and not making sense.

## 2022-01-01 NOTE — Consult Note (Addendum)
Neurology Consult H&P  Luis Strong MR# [id number] 01/01/2022  CC: code stroke  History is obtained from: ED staff and chart.  HPI: Luis Strong is a 76 y.o. male PMHx as reviewed below went to lunch with some friends and about 1130 started to ramble and not make sense. On arrival, he was very agitated and did not respond to midazolam or to 10mg  haloperidol and was ultimately intubated and code stroke called.  LKW: 1130 tNK given: Not indicated IR Thrombectomy No LVA Modified Rankin Scale: 0-Completely asymptomatic and back to baseline post- stroke NIHSS: 32 - intubated  ROS: Unable to assess due to acute encephalopathy, intubated with paralytic and sedated.  Past Medical History:  Diagnosis Date   Abnormal cardiovascular stress test 11/15/2015   Arteriosclerosis of coronary artery 11/16/2014   Overview:  Sp pci stetn of lad 2015    Benign essential HTN 11/16/2014   Cancer ([place]) 2001   skin cancer on face   Chest pain 11/03/2014   Combined fat and carbohydrate induced hyperlipemia 41/32/4401   Complication of anesthesia 10/2014   severe head ache with cardiac stent placement. Refered to neurologist.   Depression    GERD (gastroesophageal reflux disease)    History of kidney stones    Hyperlipidemia    Hypertension    Kidney stones    x 20 years   MI (mitral incompetence) 04/20/2014   Sleep apnea    CPAP   Stroke ([place]) 2020   No family history on file.  Social History:  reports that he has never smoked. He has never used smokeless tobacco. He reports current alcohol use of about 3.0 standard drinks per week. He reports that he does not use drugs.   Prior to Admission medications   Medication Sig Start Date End Date Taking? Authorizing Provider  aspirin EC 81 MG tablet Take 81 mg by mouth daily. Swallow whole.   Yes [provider]  citalopram (CELEXA) 40 MG tablet Take 1 tablet (40 mg total) by mouth daily. 07/17/21  Yes Juline Patch, MD  diltiazem  (CARDIZEM CD) 180 MG 24 hr capsule Take 1 capsule (180 mg total) by mouth daily. 12/22/21 01/21/22 Yes Anderson, Carlynn Herald, MD  Flaxseed, Linseed, (FLAXSEED OIL) 1200 MG CAPS Take 1,200 mg by mouth daily.   Yes [provider]  Melatonin 3 MG CAPS Take 3 mg by mouth at bedtime.   Yes [provider]  metoprolol succinate (TOPROL-XL) 25 MG 24 hr tablet Take 1 tablet (25 mg total) by mouth at bedtime. 07/17/21  Yes Juline Patch, MD  Multiple Vitamin (MULTIVITAMIN WITH MINERALS) TABS tablet Take 1 tablet by mouth daily.   Yes [provider]  pantoprazole (PROTONIX) 40 MG tablet TAKE 1 TABLET TWICE A DAY BEFORE MEALS 12/06/21  Yes Juline Patch, MD  rosuvastatin (CRESTOR) 20 MG tablet Take 1 tablet (20 mg total) by mouth at bedtime. 07/17/21  Yes Juline Patch, MD  acetaminophen (TYLENOL) 500 MG tablet Take 1,000 mg by mouth every 6 (six) hours as needed for moderate pain.    [provider]  Carboxymethylcellul-Glycerin (LUBRICATING EYE DROPS OP) Place 1 drop into both eyes daily as needed (dry eyes).    [provider]  cetirizine (ZYRTEC) 10 MG tablet Take 10 mg by mouth as needed for allergies.    [provider]  hydrocortisone cream 1 % Apply 1 application topically daily as needed for itching.    [provider]  trospium (SANCTURA) 20 MG tablet Take 1 tablet (20 mg total) by mouth 2 (two) times daily as needed (Urinary frequency, urgency, bladder spasms/stent irritation). Patient not taking: Reported on 01/01/2022 12/05/21   Abbie Sons, MD   Exam: Current vital signs: BP (!) 178/91    Pulse (!) 124    Temp (!) 96 F (35.6 C)    Resp (!) 22    Ht 5\' 9"  (1.753 m)    Wt 94.5 kg    SpO2 100%    BMI 30.78 kg/m   Physical Exam  Constitutional: Appears well-developed and well-nourished.  Psych: Unable to assess due to acute encephalopathy, intubated with paralytic and sedated. Eyes: No scleral injection HENT: No OP  obstruction. Head: Normocephalic.  Cardiovascular: Normal rate and regular rhythm.  Respiratory: Effort normal, symmetric excursions bilaterally, no audible wheezing. GI: Soft.  No distension. There is no tenderness.  Skin: WDI  Neuro: Mental Status: Unable to assess due to acute encephalopathy, intubated with paralytic and sedated. Visual Fields unreactive to confrontation. Pupils are 50mm equal, round, and sluggishly reactive to light down to 8mm. Doll's eye (+) Tone is normal. Bulk is normal. Strength Unable to assess due to acute encephalopathy, intubated with paralytic and sedated. Sensation Unable to assess due to acute encephalopathy, intubated with paralytic and sedated. Deep Tendon Reflexes: Unable to assess due to acute encephalopathy, intubated with paralytic and sedated. Toes Unable to assess due to acute encephalopathy, intubated with paralytic and sedated. FNF and HKS Unable to assess due to acute encephalopathy, intubated with paralytic and sedated. Gait - Deferred  I have reviewed labs in epic and the pertinent results are: Na 132  UA: Leukocytes (+), bacteria (+),   I have reviewed the images obtained: NCT head showed acute ischemic changes, small remote appearing infarct in the right basal ganglia  which is new compared to MRI brain 2019. No evidence of acute hemorrhage, mass lesion, midline shift, or hydrocephalus. Mild patchy white matter hypoattenuation likely chronic microvascular ischemic disease.   CTA head and neck showed no intervenable large vessel occlusion or significant flow limiting stenosis.  Assessment: Luis Strong is a 76 y.o. male PMHx as noted above with acute onset severe agitation necessitating intubation. There were no reported focal neurologic deficits and imaging did not reveal ICH or acute ischemic changes, hemorrhage or mass. Angiography did not reveal significant flow limiting stenosis or large vessel occlusion. Labs thus far showed  hyponatremia and possibly a urinary tract infection. CTA showed mild crazy paving in right upper lobe which may be related to effusion and he will need chest x ray.   Impression:  Hyperactive delirium Hyponatremia Probable UTI  Plan: - MRI brain without contrast and if positive for acute stroke please call neurology and will need to complete stroke evaluation. - Recommend metabolic/infectious workup with CMP, UCx, CXR, CK, serum lactate. - Neurology will remain available, please call for questions.  This patient is critically ill and at significant risk of neurological worsening, death and care requires constant monitoring of vital signs, hemodynamics,respiratory and cardiac monitoring, neurological assessment, discussion with family, other specialists and medical decision making of high complexity. I spent 70 minutes of neurocritical care time  in the care of  this patient. This was time spent independent of any time provided by nurse practitioner or PA.  Electronically signed by:  Lynnae Sandhoff, MD Page: [phone] 01/01/2022, 3:53 PM  If 7pm- 7am, please page neurology on call as listed in [place].

## 2022-01-01 NOTE — ED Notes (Signed)
Attempted to call report at this time. ICU to call back this RN

## 2022-01-02 ENCOUNTER — Inpatient Hospital Stay: Payer: Medicare Other

## 2022-01-02 ENCOUNTER — Inpatient Hospital Stay
Admit: 2022-01-02 | Discharge: 2022-01-02 | Disposition: A | Discharge status: Home / Self Care | Payer: Medicare Other | Attending: Pulmonary Disease | Admitting: Pulmonary Disease

## 2022-01-02 DIAGNOSIS — G9340 Encephalopathy, unspecified: Secondary | ICD-10-CM | POA: Diagnosis not present

## 2022-01-02 DIAGNOSIS — J9601 Acute respiratory failure with hypoxia: Secondary | ICD-10-CM | POA: Diagnosis not present

## 2022-01-02 DIAGNOSIS — N3090 Cystitis, unspecified without hematuria: Secondary | ICD-10-CM | POA: Diagnosis not present

## 2022-01-02 LAB — BASIC METABOLIC PANEL
Anion gap: 5 (ref 5–15)
BUN: 25 mg/dL — ABNORMAL HIGH (ref 8–23)
CO2: 21 mmol/L — ABNORMAL LOW (ref 22–32)
Calcium: 8.1 mg/dL — ABNORMAL LOW (ref 8.9–10.3)
Chloride: 108 mmol/L (ref 98–111)
Creatinine, Ser: 2.16 mg/dL — ABNORMAL HIGH (ref 0.61–1.24)
GFR, Estimated: 31 mL/min — ABNORMAL LOW (ref >60)
Glucose, Bld: 116 mg/dL — ABNORMAL HIGH (ref 70–99)
Potassium: 4.8 mmol/L (ref 3.5–5.1)
Sodium: 134 mmol/L — ABNORMAL LOW (ref 135–145)

## 2022-01-02 LAB — BLOOD GAS, ARTERIAL
Acid-base deficit: 5.1 mmol/L — ABNORMAL HIGH (ref 0.0–2.0)
Bicarbonate: 16 mmol/L — ABNORMAL LOW (ref 20.0–28.0)
FIO2: 30
MECHVT: 500 mL
Mechanical Rate: 18
O2 Saturation: 99.7 %
PEEP: 5 cmH2O
Patient temperature: 37
pCO2 arterial: 21 mmHg — ABNORMAL LOW (ref 32–48)
pH, Arterial: 7.49 — ABNORMAL HIGH (ref 7.35–7.45)
pO2, Arterial: 184 mmHg — ABNORMAL HIGH (ref 83–108)

## 2022-01-02 LAB — CBC
HCT: 36.8 % — ABNORMAL LOW (ref 39.0–52.0)
Hemoglobin: 11.8 g/dL — ABNORMAL LOW (ref 13.0–17.0)
MCH: 28 pg (ref 26.0–34.0)
MCHC: 32.1 g/dL (ref 30.0–36.0)
MCV: 87.2 fL (ref 80.0–100.0)
Platelets: 135 10*3/uL — ABNORMAL LOW (ref 150–400)
RBC: 4.22 MIL/uL (ref 4.22–5.81)
RDW: 13.6 % (ref 11.5–15.5)
WBC: 7.7 10*3/uL (ref 4.0–10.5)
nRBC: 0 % (ref 0.0–0.2)

## 2022-01-02 LAB — ECHOCARDIOGRAM COMPLETE
AR max vel: 2.67 cm2
AV Area VTI: 2.73 cm2
AV Area mean vel: 2.68 cm2
AV Mean grad: 5 mmHg
AV Peak grad: 8.6 mmHg
Ao pk vel: 1.47 m/s
Area-P 1/2: 5.27 cm2
Height: 69 in
MV VTI: 3.33 cm2
S' Lateral: 2.84 cm
Weight: 3111.13 oz

## 2022-01-02 LAB — GLUCOSE, CAPILLARY
Glucose-Capillary: 108 mg/dL — ABNORMAL HIGH (ref 70–99)
Glucose-Capillary: 110 mg/dL — ABNORMAL HIGH (ref 70–99)
Glucose-Capillary: 111 mg/dL — ABNORMAL HIGH (ref 70–99)
Glucose-Capillary: 111 mg/dL — ABNORMAL HIGH (ref 70–99)
Glucose-Capillary: 112 mg/dL — ABNORMAL HIGH (ref 70–99)
Glucose-Capillary: 118 mg/dL — ABNORMAL HIGH (ref 70–99)
Glucose-Capillary: 129 mg/dL — ABNORMAL HIGH (ref 70–99)
Glucose-Capillary: 84 mg/dL (ref 70–99)
Glucose-Capillary: 91 mg/dL (ref 70–99)

## 2022-01-02 LAB — PATHOLOGIST SMEAR REVIEW

## 2022-01-02 LAB — HEMOGLOBIN A1C
Hgb A1c MFr Bld: 5.8 % — ABNORMAL HIGH (ref 4.8–5.6)
Mean Plasma Glucose: 119.76 mg/dL

## 2022-01-02 LAB — PROCALCITONIN: Procalcitonin: <0.1 ng/mL

## 2022-01-02 LAB — PHOSPHORUS: Phosphorus: 3.7 mg/dL (ref 2.5–4.6)

## 2022-01-02 LAB — MAGNESIUM: Magnesium: 1.7 mg/dL (ref 1.7–2.4)

## 2022-01-02 MED ORDER — ACETAMINOPHEN 325 MG PO TABS
650.0000 mg | ORAL_TABLET | Freq: Four times a day (QID) | ORAL | Status: DC | PRN
  Administered 2022-01-02 – 2022-01-03 (×2): 650 mg via ORAL
  Filled 2022-01-02 (×2): qty 2

## 2022-01-02 MED ORDER — ENSURE ENLIVE PO LIQD
237.0000 mL | Freq: Three times a day (TID) | ORAL | Status: DC
  Administered 2022-01-02 – 2022-01-04 (×5): 237 mL via ORAL

## 2022-01-02 MED ORDER — ADULT MULTIVITAMIN W/MINERALS CH
1.0000 | ORAL_TABLET | Freq: Every day | ORAL | Status: DC
  Administered 2022-01-03 – 2022-01-04 (×2): 1 via ORAL
  Filled 2022-01-02 (×2): qty 1

## 2022-01-02 MED ORDER — ENOXAPARIN SODIUM 40 MG/0.4ML IJ SOSY
40.0000 mg | PREFILLED_SYRINGE | INTRAMUSCULAR | Status: DC
  Administered 2022-01-02 – 2022-01-03 (×2): 40 mg via SUBCUTANEOUS
  Filled 2022-01-02 (×2): qty 0.4

## 2022-01-02 MED ORDER — MAGNESIUM SULFATE 2 GM/50ML IV SOLN
2.0000 g | Freq: Once | INTRAVENOUS | Status: AC
  Administered 2022-01-02: 2 g via INTRAVENOUS
  Filled 2022-01-02: qty 50

## 2022-01-02 NOTE — Progress Notes (Signed)
Initial Nutrition Assessment  DOCUMENTATION CODES:   Not applicable  INTERVENTION:   Ensure Enlive po TID, each supplement provides 350 kcal and 20 grams of protein.  MVI po daily   Pt at high refeed risk; recommend monitor potassium, magnesium and phosphorus labs daily until stable  NUTRITION DIAGNOSIS:   Increased nutrient needs related to acute illness as evidenced by estimated needs.  GOAL:   Patient will meet greater than or equal to 90% of their needs  MONITOR:   PO intake, Supplement acceptance, Labs, Weight trends, I & O's, Skin  REASON FOR ASSESSMENT:   Ventilator, Malnutrition Screening Tool    ASSESSMENT:   76 y/o male with h/o HTN, MI, CKD III, OSA, depression, stroke and recurrent UTI who is admitted with AMS, sepsis, UTI and AKI.  Visited pt's room today while pt was still intubated; pt now extubated. Pt initiated on a heart healthy diet. RD will add supplements and MVI to help pt meet his estimated needs. Pt is likely at high refeed risk. Per chart, pt is down 16lbs(7%) over the past month; this is significant weight loss. RD suspects pt with poor oral intake pta.   Medications reviewed and include: colace, lovenox, insulin, protonix, miralax, ceftriaxone  Labs reviewed: Na 134(L), K 4.8 wnl, BUN 25(H), creat 2.16(H), P 3.7 wnl, Mg 1.7 wnl Cbgs- 84, 91, 108, 111 x 24 hrs AIC 5.8(H)- 2/14  NUTRITION - FOCUSED PHYSICAL EXAM:  Flowsheet Row Most Recent Value  Orbital Region Mild depletion  Upper Arm Region No depletion  Thoracic and Lumbar Region No depletion  Buccal Region No depletion  Temple Region Mild depletion  Clavicle Bone Region Mild depletion  Clavicle and Acromion Bone Region Mild depletion  Scapular Bone Region No depletion  Dorsal Hand No depletion  Patellar Region No depletion  Anterior Thigh Region No depletion  Posterior Calf Region Mild depletion  Edema (RD Assessment) None  Hair Reviewed  Eyes Reviewed  Mouth Reviewed  Skin  Reviewed  Nails Reviewed   Diet Order:   Diet Order             Diet Heart Room service appropriate? Yes; Fluid consistency: Thin  Diet effective now                  EDUCATION NEEDS:   No education needs have been identified at this time  Skin:  Skin Assessment: Reviewed RN Assessment (wound elbow)  Last BM:  2/13  Height:   Ht Readings from Last 1 Encounters:  01/01/22 5\' 9"  (1.753 m)    Weight:   Wt Readings from Last 1 Encounters:  01/01/22 88.2 kg    Ideal Body Weight:  72.7 kg  BMI:  Body mass index is 28.71 kg/m.  Estimated Nutritional Needs:   Kcal:  1900-2200kcal/day  Protein:  95-110g/day  Fluid:  1.8-2.1L/day  Koleen Distance MS, RD, LDN Please refer to [facility] for RD and/or RD on-call/weekend/after hours pager

## 2022-01-02 NOTE — Progress Notes (Signed)
[place] for Electrolyte Monitoring and Replacement   Recent Labs: Potassium (mmol/L)  Date Value  01/02/2022 4.8  11/11/2014 3.4 (L)   Magnesium (mg/dL)  Date Value  01/02/2022 1.7   Calcium (mg/dL)  Date Value  01/02/2022 8.1 (L)   Calcium, Total (mg/dL)  Date Value  11/11/2014 8.7   Albumin (g/dL)  Date Value  01/01/2022 3.8  12/28/2021 4.3  04/18/2012 4.2   Phosphorus (mg/dL)  Date Value  01/02/2022 3.7   Sodium (mmol/L)  Date Value  01/02/2022 134 (L)  12/28/2021 135  11/11/2014 136     Assessment: 76 y.o. male w/ PMH of CKD, HTN, CAD, CVA, GI bleed, nephrolithiasis status post ureteroscopy with bilateral J stent placement on 12/05/2021 who presented on this admission w/ AMS. Pharmacy is asked to follow and replace electrolytes while in the CCU  Goal of Therapy:  Electrolytes WNL  Plan:  2 grams IV magnesium sulfate x 1 Recheck electrolytes in am  Dallie Piles ,PharmD Clinical Pharmacist 01/02/2022 7:04 AM

## 2022-01-02 NOTE — Progress Notes (Signed)
*  PRELIMINARY RESULTS* Echocardiogram 2D Echocardiogram has been performed.  Luis Strong [name] [name] 01/02/2022, 10:34 AM

## 2022-01-02 NOTE — Progress Notes (Signed)
Patient successfully extubated to 2L [place], Per MD orders. Saturations are 98% on 2L at this time, will continue to monitor.

## 2022-01-02 NOTE — Plan of Care (Signed)

## 2022-01-02 NOTE — Progress Notes (Signed)
NAME:  Luis Strong, MRN:  [id number], DOB:  1946-01-30, LOS: 1 ADMISSION DATE:  01/01/2022  Brief Pt Description / Synopsis:  76 year old male admitted with acute metabolic encephalopathy (currently unclear etiology) requiring intubation for airway protection.  Code stroke initiated in the ED, not a candidate for thrombolytics.   History of Present Illness:  Luis Strong is a 76 year old male with a past medical history significant for chronic kidney disease stage III, hypertension, coronary artery disease, CVA, OSA on CPAP, GI bleed in 2020 (EGD revealed nonbleeding esophageal ulcer), nephrolithiasis status post ureteroscopy with bilateral J stent placement on 12/05/2021 who presented to [facility] ED on 01/01/2022 due to complaints of acute abrupt onset of altered mental status.   Patient is currently intubated and sedated, therefore history is obtained from chart review along with history provided by son at bedside.  Per report patient was in his usual state of health, then today he went to lunch with some friends at about 27, of which he abruptly became confused, agitated, rambling, and not making any sense.  The patient's friends transported the patient to his son's office (son is an Forensic psychologist and [place]).  EMS was then dispatched   Upon arrival to the ED he remained very agitated, did not respond to Versed or 10 mg of Haldol.  He required intubation for airway protection and to allow for work-up of encephalopathy.  Code stroke was called, neurology evaluated the patient, and he was deemed not a candidate for thrombolytics.   Of note he was recently hospitalized from 12/11/2021 through 12/22/2021 for treatment of Sepsis and Enterococcus faecalis bacteremia in the setting of complicated UTI after recent bilateral JJ stent placement on 1/17.  He was followed by infectious diseases, which she completed 10 days of IV ampicillin with p.o. amoxicillin for an additional 4 days to complete 14 days of  total treatment.  His course was complicated by new onset atrial fibrillation with RVR in the setting of acute illness of which was well controlled on Cardizem.  He was not started on anticoagulation and was to follow-up with cardiology outpatient.   ED Course: Initial vital signs: Temperature 94.8 F, respiratory rate 15, pulse 101, blood pressure 158/83, PO2 99% Significant Labs: Sodium 132, potassium 3.4, bicarbonate 18, chloride 103, glucose 116, BUN 26, creatinine 2.2, CK2 42, lactic acid 1.8, platelets 127, serum acetaminophen less than 10, salicylates less than 7, ethyl alcohol less than 10 Urine drug screen positive for benzodiazepines (obtained after administration of Versed and intubation) Urinalysis consistent with UTI Imaging: Chest x-ray>>Endotracheal tube with tip terminating 6 cm above the carina. Enteric tube coursing below the hemidiaphragm with tip and side port overlying the expected region of the gastric lumen. The heart and mediastinal contours are unchanged. Atherosclerotic plaque. No focal consolidation. Similar increased interstitial markings. Blunting of left costophrenic angle with trace pleural effusion not excluded. No right pleural effusion. No pneumothorax. No acute osseous abnormality. CT head without contrast>> small remote appearing infarct in the right basal ganglia  which is new compared to MRI brain 2019. No evidence of acute hemorrhage, mass lesion, midline shift, or hydrocephalus. Mild patchy white matter hypoattenuation likely chronic microvascular ischemic disease.  CTA head and neck>>no intervenable large vessel occlusion or significant flow limiting stenosis. Medications given: 1 g ceftriaxone,, Haldol, Versed, 2 L normal saline boluses   PCCM is asked to admit the patient to ICU for further work-up and treatment.   Pertinent  Medical History  Nephrolithiasis status post cystoscopy  with ureteral stent placement 11/25/2021 CKD stage III Coronary artery  disease Hypertension OSA on CPAP GI bleed   Micro Data:  2/13: SARS-CoV-2 and influenza PCR>> negative 2/13: Blood culture x2>> 2/13: Urine>>   Antimicrobials:  Ceftriaxone 2/13>>   Significant Hospital Events: Including procedures, antibiotic start and stop dates in addition to other pertinent events   2/13: Presented to ED with altered mental status, required intubation for airway protection.  Code stroke called in the ED, evaluated by neurology, not a candidate for thrombolytics. PCCM asked to admit 2/14 remains on vent        Interim History / Subjective:  Severe resp failure Remains on vent Elevated BP Severe toxic metabolic encephalopathy      Objective   Blood pressure 108/60, pulse 84, temperature 99.2 F (37.3 C), temperature source Axillary, resp. rate 15, height 5' 9" (1.753 m), weight 88.2 kg, SpO2 96 %.    Vent Mode: PRVC FiO2 (%):  [30 %-60 %] 30 % Set Rate:  [14 bmp-18 bmp] 14 bmp Vt Set:  [450 mL-500 mL] 450 mL PEEP:  [5 cmH20] 5 cmH20   Intake/Output Summary (Last 24 hours) at 01/02/2022 0747 Last data filed at 01/02/2022 0300 Gross per 24 hour  Intake 978.1 ml  Output 700 ml  Net 278.1 ml   Filed Weights   01/01/22 1439 01/01/22 1810  Weight: 94.5 kg 88.2 kg     REVIEW OF SYSTEMS  PATIENT IS UNABLE TO PROVIDE COMPLETE REVIEW OF SYSTEMS DUE TO SEVERE CRITICAL ILLNESS AND TOXIC METABOLIC ENCEPHALOPATHY    PHYSICAL EXAMINATION:  GENERAL:critically ill appearing, +resp distress EYES: Pupils equal, round, reactive to light.  No scleral icterus.  MOUTH: Moist mucosal membrane. INTUBATED NECK: Supple.  PULMONARY: +rhonchi, +wheezing CARDIOVASCULAR: S1 and S2.  No murmurs  GASTROINTESTINAL: Soft, nontender, -distended. Positive bowel sounds.  MUSCULOSKELETAL: No swelling, clubbing, or edema.  NEUROLOGIC: obtunded SKIN:intact,warm,dry     Labs/imaging that I havepersonally reviewed  (right click and "Reselect all SmartList  Selections" daily)     ASSESSMENT AND PLAN SYNOPSIS  76 yo WM admitted with severe Acute Metabolic Encephalopathy, Etiology currently unclear (hyperactive delirium vs mild hyponatremia vs UTI) leading to inability to protect airway Code Stroke called in ED, not a candidate for thrombolytics Sedation needs in the setting of mechanical ventilation   Severe ACUTE Hypoxic and Hypercapnic Respiratory Failure -continue Mechanical Ventilator support -Wean Fio2 and PEEP as tolerated -VAP/VENT bundle implementation - Wean PEEP & FiO2 as tolerated, maintain SpO2 > 88% - Head of bed elevated 30 degrees, VAP protocol in place - Plateau pressures less than 30 cm H20  - Intermittent chest x-ray & ABG PRN - Ensure adequate pulmonary hygiene  -will perform SAT/SBT when respiratory parameters are met and when family arrives   Vent Mode: PRVC FiO2 (%):  [30 %-60 %] 30 % Set Rate:  [14 bmp-18 bmp] 14 bmp Vt Set:  [450 mL-500 mL] 450 mL PEEP:  [5 cmH20] 5 cmH20   CARDIAC ICU monitoring   ACUTE KIDNEY INJURY/Renal Failure -continue Foley Catheter-assess need -Avoid nephrotoxic agents -Follow urine output, BMP -Ensure adequate renal perfusion, optimize oxygenation -Renal dose medications   Intake/Output Summary (Last 24 hours) at 01/02/2022 0747 Last data filed at 01/02/2022 0605 Gross per 24 hour  Intake 978.1 ml  Output 700 ml  Net 278.1 ml   BMP Latest Ref Rng & Units 01/02/2022 01/01/2022 12/28/2021  Glucose 70 - 99 mg/dL 116(H) 116(H) 91  BUN 8 - 23 mg/dL 25(H)  26(H) 26  Creatinine 0.61 - 1.24 mg/dL 2.16(H) 2.20(H) 2.45(H)  BUN/Creat Ratio 10 - 24 - - 11  Sodium 135 - 145 mmol/L 134(L) 132(L) 135  Potassium 3.5 - 5.1 mmol/L 4.8 3.4(L) 4.8  Chloride 98 - 111 mmol/L 108 103 99  CO2 22 - 32 mmol/L 21(L) 18(L) 20  Calcium 8.9 - 10.3 mg/dL 8.1(L) 9.1 9.9      NEUROLOGY Acute toxic metabolic encephalopathy, need for sedation Goal RASS -2 to -3   INFECTIOUS DISEASE -continue  antibiotics as prescribed -follow up cultures  ENDO - ICU hypoglycemic\Hyperglycemia protocol -check FSBS per protocol   GI GI PROPHYLAXIS as indicated  NUTRITIONAL STATUS DIET-->NPO for now Constipation protocol as indicated   ELECTROLYTES -follow labs as needed -replace as needed -pharmacy consultation and following     Best practice (right click and "Reselect all SmartList Selections" daily)  Diet:  NPO Pain/Anxiety/Delirium protocol (if indicated): Yes (RASS goal 0) VAP protocol (if indicated): Yes DVT prophylaxis: Subcutaneous Heparin GI prophylaxis: PPI Glucose control:  SSI No Central venous access:  N/A Arterial line:  N/A Foley:  Yes, and it is still needed Mobility:  bed rest  Code Status:  FULL CODE Disposition: ICU  Labs   CBC: Recent Labs  Lab 12/28/21 1117 01/01/22 1524 01/02/22 0358  WBC 7.2 5.7 7.7  NEUTROABS 4.6 3.9  --   HGB 14.5 13.9 11.8*  HCT 44.6 42.5 36.8*  MCV 86 86.9 87.2  PLT 271 127* 135*    Basic Metabolic Panel: Recent Labs  Lab 12/28/21 1117 01/01/22 1524 01/02/22 0358  NA 135 132* 134*  K 4.8 3.4* 4.8  CL 99 103 108  CO2 20 18* 21*  GLUCOSE 91 116* 116*  BUN 26 26* 25*  CREATININE 2.45* 2.20* 2.16*  CALCIUM 9.9 9.1 8.1*  MG  --   --  1.7  PHOS 4.1  --  3.7   GFR: Estimated Creatinine Clearance: 32 mL/min (A) (by C-G formula based on SCr of 2.16 mg/dL (H)). Recent Labs  Lab 12/28/21 1117 01/01/22 1524 01/01/22 1834 01/02/22 0358  PROCALCITON  --  <0.10  --  <0.10  WBC 7.2 5.7  --  7.7  LATICACIDVEN  --  1.8 1.6  --     Liver Function Tests: Recent Labs  Lab 12/28/21 1117 01/01/22 1524  AST 25 31  ALT 28 23  ALKPHOS 68 60  BILITOT 0.6 1.1  PROT  --  7.7  ALBUMIN 4.3 3.8   No results for input(s): LIPASE, AMYLASE in the last 168 hours. No results for input(s): AMMONIA in the last 168 hours.  ABG    Component Value Date/Time   PHART 7.49 (H) 01/02/2022 0418   PCO2ART 21 (L) 01/02/2022 0418    PO2ART 184 (H) 01/02/2022 0418   HCO3 16.0 (L) 01/02/2022 0418   ACIDBASEDEF 5.1 (H) 01/02/2022 0418   O2SAT 99.7 01/02/2022 0418     Coagulation Profile: Recent Labs  Lab 01/01/22 1524  INR 1.0    Cardiac Enzymes: Recent Labs  Lab 01/01/22 1524  CKTOTAL 242    HbA1C: No results found for: HGBA1C  CBG: Recent Labs  Lab 01/01/22 1803 01/01/22 2028 01/02/22 0058 01/02/22 0316 01/02/22 0734  GLUCAP 112* 99 111* 108* 91    Allergies Allergies  Allergen Reactions   Capsaicin Itching and Rash    Severe rash and itching.       DVT/GI PRX  assessed I Assessed the need for Labs I Assessed the  need for Foley I Assessed the need for Central Venous Line Family Discussion when available I Assessed the need for Mobilization I made an Assessment of medications to be adjusted accordingly Safety Risk assessment completed  CASE DISCUSSED IN MULTIDISCIPLINARY ROUNDS WITH ICU TEAM     Critical Care Time devoted to patient care services described in this note is 45 minutes.  Critical care was necessary to treat or prevent imminent or life-threatening deterioration.    Patient with Multiorgan failure and at high risk for cardiac arrest and death.    Corrin Parker, M.D.  [facility]  Medical Director [place] Director [facility] Cardio-Pulmonary Department

## 2022-01-02 NOTE — Progress Notes (Signed)
1050 patient extubated placed on 5l [place] then decreased to 2L [place] son at bedside 1120 patient desating 65s with correlating HR and SP02 and good wave form patient does not sound congested and states he doesn't eel SOB, CC team made aware 02 increased and then decreased as 02 STATS increased

## 2022-01-02 NOTE — Progress Notes (Signed)
Vent settings changed per NP

## 2022-01-03 DIAGNOSIS — R410 Disorientation, unspecified: Secondary | ICD-10-CM

## 2022-01-03 LAB — CBC
HCT: 36.8 % — ABNORMAL LOW (ref 39.0–52.0)
Hemoglobin: 11.8 g/dL — ABNORMAL LOW (ref 13.0–17.0)
MCH: 27.8 pg (ref 26.0–34.0)
MCHC: 32.1 g/dL (ref 30.0–36.0)
MCV: 86.8 fL (ref 80.0–100.0)
Platelets: 121 10*3/uL — ABNORMAL LOW (ref 150–400)
RBC: 4.24 MIL/uL (ref 4.22–5.81)
RDW: 13.6 % (ref 11.5–15.5)
WBC: 6.7 10*3/uL (ref 4.0–10.5)
nRBC: 0 % (ref 0.0–0.2)

## 2022-01-03 LAB — THYROID PANEL WITH TSH
Free Thyroxine Index: 1.4 (ref 1.2–4.9)
T3 Uptake Ratio: 25 % (ref 24–39)
T4, Total: 5.5 ug/dL (ref 4.5–12.0)
TSH: 3.84 u[IU]/mL (ref 0.450–4.500)

## 2022-01-03 LAB — GLUCOSE, CAPILLARY
Glucose-Capillary: 112 mg/dL — ABNORMAL HIGH (ref 70–99)
Glucose-Capillary: 108 mg/dL — ABNORMAL HIGH (ref 70–99)
Glucose-Capillary: 116 mg/dL — ABNORMAL HIGH (ref 70–99)
Glucose-Capillary: 119 mg/dL — ABNORMAL HIGH (ref 70–99)
Glucose-Capillary: 102 mg/dL — ABNORMAL HIGH (ref 70–99)

## 2022-01-03 LAB — URINE CULTURE: Culture: NO GROWTH

## 2022-01-03 LAB — HEPARIN INDUCED PLATELET AB (HIT ANTIBODY): Heparin Induced Plt Ab: 0.09 OD (ref 0.000–0.400)

## 2022-01-03 LAB — BASIC METABOLIC PANEL
Anion gap: 8 (ref 5–15)
BUN: 19 mg/dL (ref 8–23)
CO2: 21 mmol/L — ABNORMAL LOW (ref 22–32)
Calcium: 8.7 mg/dL — ABNORMAL LOW (ref 8.9–10.3)
Chloride: 109 mmol/L (ref 98–111)
Creatinine, Ser: 1.88 mg/dL — ABNORMAL HIGH (ref 0.61–1.24)
GFR, Estimated: 37 mL/min — ABNORMAL LOW (ref >60)
Glucose, Bld: 102 mg/dL — ABNORMAL HIGH (ref 70–99)
Potassium: 3.9 mmol/L (ref 3.5–5.1)
Sodium: 138 mmol/L (ref 135–145)

## 2022-01-03 LAB — MAGNESIUM: Magnesium: 2.1 mg/dL (ref 1.7–2.4)

## 2022-01-03 LAB — PROCALCITONIN: Procalcitonin: <0.1 ng/mL

## 2022-01-03 MED ORDER — METOPROLOL SUCCINATE ER 25 MG PO TB24
25.0000 mg | ORAL_TABLET | Freq: Every day | ORAL | Status: DC
  Administered 2022-01-03 – 2022-01-04 (×2): 25 mg via ORAL
  Filled 2022-01-03 (×2): qty 1

## 2022-01-03 MED ORDER — CITALOPRAM HYDROBROMIDE 20 MG PO TABS
40.0000 mg | ORAL_TABLET | Freq: Every day | ORAL | Status: DC
  Administered 2022-01-03 – 2022-01-04 (×2): 40 mg via ORAL
  Filled 2022-01-03 (×2): qty 2

## 2022-01-03 MED ORDER — ROSUVASTATIN CALCIUM 20 MG PO TABS
20.0000 mg | ORAL_TABLET | Freq: Every day | ORAL | Status: DC
  Administered 2022-01-03 – 2022-01-04 (×2): 20 mg via ORAL
  Filled 2022-01-03 (×2): qty 1

## 2022-01-03 MED ORDER — DILTIAZEM HCL ER COATED BEADS 180 MG PO CP24
180.0000 mg | ORAL_CAPSULE | Freq: Every day | ORAL | Status: DC
  Administered 2022-01-03 – 2022-01-04 (×2): 180 mg via ORAL
  Filled 2022-01-03 (×3): qty 1

## 2022-01-03 NOTE — Progress Notes (Deleted)
Patient taken to OR by [facility] and OR staff.

## 2022-01-03 NOTE — Progress Notes (Signed)
Telephone report called to [employer].  Patient taken to room 130 via wheelchair.

## 2022-01-03 NOTE — TOC Initial Note (Signed)
Transition of Care [facility]) - Initial/Assessment Note    Patient Details  Name: Luis Strong MRN: [id number] Date of Birth: 11-08-1946  [facility] CM/SW Contact:    Shelbie Hutching, RN Phone Number: 01/03/2022, 3:38 PM  Clinical Narrative:                 Patient admitted to the hospital with altered mental status and stroke like symptoms.  RNCM met with patient at the bedside.  Patient still seems altered, he is oriented to person and place but seems confused about the circumstances of his admission. Patient lives with his son TJ in [place].  Patient is current with his PCP, Dr. Ronnald Ramp in [place].  Patient is open with [place] with [facility] notified of admission.  TOC will cont to follow.  Expected Discharge Plan: [place] Barriers to Discharge: Continued Medical Work up   Patient Goals and CMS Choice Patient states their goals for this hospitalization and ongoing recovery are:: to get home CMS Medicare.gov Compare Post Acute Care list provided to:: Patient Choice offered to / list presented to : Patient  Expected Discharge Plan and Services Expected Discharge Plan: [place]   Discharge Planning Services: CM Consult Post Acute Care Choice: Home Health, Resumption of Svcs/PTA Provider Living arrangements for the past 2 months: Single Family Home (townhome)                 DME Arranged: N/A DME Agency: NA       HH Arranged: PT, OT [place] Agency: [place] ([facility]) Date HH Agency Contacted: 01/03/22 Time [place]: 69 Representative spoke with at [place]: [place] Arrangements/Services Living arrangements for the past 2 months: [place] (townhome) Lives with:: Adult Children Patient language and need for interpreter reviewed:: Yes Do you feel safe going back to the place where you live?: Yes      Need for Family Participation in Patient Care: Yes  (Comment) Care giver support system in place?: Yes (comment) Current home services: DME ([place]) Criminal Activity/Legal Involvement Pertinent to Current Situation/Hospitalization: No - Comment as needed  Activities of Daily Living Home Assistive Devices/Equipment: None ADL Screening (condition at time of admission) Patient's cognitive ability adequate to safely complete daily activities?: No Is the patient deaf or have difficulty hearing?: No Does the patient have difficulty seeing, even when wearing glasses/contacts?: No Does the patient have difficulty concentrating, remembering, or making decisions?: Yes Patient able to express need for assistance with ADLs?: No Does the patient have difficulty dressing or bathing?: Yes Independently performs ADLs?: No Communication: Needs assistance Is this a change from baseline?: Change from baseline, expected to last <3 days Does the patient have difficulty walking or climbing stairs?: No Weakness of Legs: None Weakness of Arms/Hands: None  Permission Sought/Granted Permission sought to share information with : Case Manager, Family Supports Permission granted to share information with : Yes, Verbal Permission Granted  Share Information with NAME: TJ Arif  Permission granted to share info w AGENCY: [facility]  Permission granted to share info w Relationship: son  Permission granted to share info w Contact Information: [phone]  Emotional Assessment Appearance:: Appears stated age Attitude/Demeanor/Rapport: Engaged Affect (typically observed): Accepting Orientation: : Oriented to Self, Oriented to Place Alcohol / Substance Use: Not Applicable Psych Involvement: No (comment)  Admission diagnosis:  Acute encephalopathy [G93.40] Cystitis [N30.90] Altered mental status, unspecified [R41.82] Patient Active Problem List  Diagnosis Date Noted   Altered mental status, unspecified 01/01/2022   Elevated alanine aminotransferase (ALT)  level    Sepsis with acute liver failure without hepatic coma or septic shock (HCC)    Tachypnea    Complicated UTI (urinary tract infection) 12/12/2021   Abnormal LFTs 12/12/2021   AKI (acute kidney injury) ([place]) 12/12/2021   History of GI bleed 12/12/2021   History of peptic ulcer 12/12/2021   S/P cystoscopy with ureteral stent placement 12/12/2021   High anion gap metabolic acidosis 93/81/0175   OSA on CPAP 05/15/2021   CPAP use counseling 05/15/2021   Cerebrovascular accident (CVA) due to thrombosis of right anterior cerebral artery ([place]) 08/04/2019   Melena    Ulcer of esophagus without bleeding    GERD (gastroesophageal reflux disease) 07/09/2019   GI bleed 07/09/2019   GI bleeding 07/09/2019   Left sided numbness 01/05/2019   Left-sided weakness 01/05/2019   Adjustment disorder with mixed anxiety and depressed mood 06/26/2018   Severe sepsis ([place]) 06/23/2018   Screening for colorectal cancer    Benign neoplasm of cecum    Benign neoplasm of descending colon    Stage 3 chronic kidney disease ([place]) 10/08/2016   Abnormal cardiovascular stress test 11/15/2015   Combined fat and carbohydrate induced hyperlipemia 11/15/2015   HTN (hypertension) 11/16/2014   Arteriosclerosis of coronary artery 11/16/2014   Chest pain 11/03/2014   MI (mitral incompetence) 04/20/2014   PCP:  Juline Patch, MD Pharmacy:   [facility] [place] [address] Phone: [phone] Fax: [phone]  [id number] - [place], [place] [address] [facility] [address] [place] [facility] [place] [address] Phone: [phone] Fax: [phone]     Social Determinants of Health (SDOH) Interventions    Readmission Risk Interventions No flowsheet data found.

## 2022-01-03 NOTE — Progress Notes (Signed)
[place] for Electrolyte Monitoring and Replacement   Recent Labs: Potassium (mmol/L)  Date Value  01/03/2022 3.9  11/11/2014 3.4 (L)   Magnesium (mg/dL)  Date Value  01/03/2022 2.1   Calcium (mg/dL)  Date Value  01/03/2022 8.7 (L)   Calcium, Total (mg/dL)  Date Value  11/11/2014 8.7   Albumin (g/dL)  Date Value  01/01/2022 3.8  12/28/2021 4.3  04/18/2012 4.2   Phosphorus (mg/dL)  Date Value  01/02/2022 3.7   Sodium (mmol/L)  Date Value  01/03/2022 138  12/28/2021 135  11/11/2014 136     Assessment: 76 y.o. male w/ PMH of CKD, HTN, CAD, CVA, GI bleed, nephrolithiasis status post ureteroscopy with bilateral J stent placement on 12/05/2021 who presented on this admission w/ AMS. Pharmacy is asked to follow and replace electrolytes while in the CCU  Goal of Therapy:  Electrolytes WNL  Plan:  No electrolyte replacement warranted today Because this protocol was generated as part of a CCU order set and the patient is being transferred, pharmacy will sign off of this protocol  Dallie Piles ,PharmD Clinical Pharmacist 01/03/2022 7:14 AM

## 2022-01-03 NOTE — Progress Notes (Addendum)
Progress Note    Luis Strong  YNW:[id number] DOB: 04/24/1946  DOA: 01/01/2022 PCP: Juline Patch, MD      Brief Narrative:    Medical records reviewed and are as summarized below:  Luis Strong is a 76 y.o. male with medical history significant for CKD stage IIIb, hypertension, CAD, stroke, OSA on CPAP, GI bleed in 2020 (EGD showed nonbleeding esophageal ulcer), nephrolithiasis s/p ureteroscopy with bilateral JJ stent placement on 12/05/2021.  He presented to the hospital because of acute change in mental status.  He was intubated and placed on mechanical ventilation for airway protection and was managed in the ICU.  He was found to have AKI.  He was extubated on 01/02/2022 and transferred to the [facility] hospitalist service on 01/03/2022.     Assessment/Plan:   Principal Problem:   Altered mental status, unspecified   Nutrition Problem: Increased nutrient needs Etiology: acute illness  Signs/Symptoms: estimated needs   Body mass index is 28.45 kg/m.  Acute hypoxic respiratory failure: S/p extubation on 01/02/2022.  He is tolerating room air.  Discontinue continuous pulse oximetry.  Acute metabolic encephalopathy/agitation: Improving.  No evidence of acute stroke on MRI brain.  Urine drug screen was positive for benzodiazepines  Abnormal urinalysis, s/p bilateral J stent placement on 12/05/2021: He is on IV Rocephin.  Urine culture did not show any growth.  No evidence of pneumonia on chest x-ray.  Cultures negative thus far.  Continue IV Rocephin for 1 more day  AKI on CKD stage IIIb: Improving.  Transfer from stepdown unit to MedSurg unit   Diet Order             Diet Heart Room service appropriate? Yes; Fluid consistency: Thin  Diet effective now                      Consultants: Intensivist  Procedures: Intubation and mechanical ventilation    Medications:    Chlorhexidine Gluconate Cloth  6 each Topical Q0600   docusate  100 mg  Per Tube BID   enoxaparin (LOVENOX) injection  40 mg Subcutaneous Q24H   feeding supplement  237 mL Oral TID BM   insulin aspart  0-9 Units Subcutaneous Q4H   multivitamin with minerals  1 tablet Oral Daily   pantoprazole (PROTONIX) IV  40 mg Intravenous Daily   polyethylene glycol  17 g Per Tube Daily   Continuous Infusions:  cefTRIAXone (ROCEPHIN)  IV Stopped (01/02/22 1755)     Anti-infectives (From admission, onward)    Start     Dose/Rate Route Frequency Ordered Stop   01/02/22 1700  cefTRIAXone (ROCEPHIN) 2 g in sodium chloride 0.9 % 100 mL IVPB        2 g 200 mL/hr over 30 Minutes Intravenous Every 24 hours 01/01/22 1720     01/01/22 1630  cefTRIAXone (ROCEPHIN) 1 g in sodium chloride 0.9 % 100 mL IVPB        1 g 200 mL/hr over 30 Minutes Intravenous  Once 01/01/22 1623 01/01/22 1702              Family Communication/Anticipated D/C date and plan/Code Status   DVT prophylaxis: enoxaparin (LOVENOX) injection 40 mg Start: 01/02/22 1600 SCDs Start: 01/01/22 1714     Code Status: Full Code  Family Communication: None Disposition Plan: Plan to discharge home in 1 to 2 days   Status is: Inpatient Remains inpatient appropriate because: IV antibiotics  Subjective:   Interval events noted.  He feels much better today.  He has no complaints.  Objective:    Vitals:   01/03/22 0400 01/03/22 0437 01/03/22 0500 01/03/22 0600  BP: (!) 132/52  (!) 143/78 (!) 145/81  Pulse: 89  (!) 105 94  Resp: (!) 23  (!) 28 (!) 24  Temp: 98.2 F (36.8 C)     TempSrc: Oral     SpO2: 95%  95% 94%  Weight:  87.4 kg    Height:       No data found.   Intake/Output Summary (Last 24 hours) at 01/03/2022 0927 Last data filed at 01/03/2022 0400 Gross per 24 hour  Intake 641.74 ml  Output 2620 ml  Net -1978.26 ml   Filed Weights   01/01/22 1439 01/01/22 1810 01/03/22 0437  Weight: 94.5 kg 88.2 kg 87.4 kg    Exam:  GEN: NAD SKIN: No  rash EYES: EOMI ENT: MMM CV: RRR PULM: CTA B ABD: soft, ND, NT, +BS CNS: AAO x 2 (person and place), non focal EXT: No edema or tenderness        Data Reviewed:   I have personally reviewed following labs and imaging studies:  Labs: Labs show the following:   Basic Metabolic Panel: Recent Labs  Lab 12/28/21 1117 01/01/22 1524 01/02/22 0358 01/03/22 0424  NA 135 132* 134* 138  K 4.8 3.4* 4.8 3.9  CL 99 103 108 109  CO2 20 18* 21* 21*  GLUCOSE 91 116* 116* 102*  BUN 26 26* 25* 19  CREATININE 2.45* 2.20* 2.16* 1.88*  CALCIUM 9.9 9.1 8.1* 8.7*  MG  --   --  1.7 2.1  PHOS 4.1  --  3.7  --    GFR Estimated Creatinine Clearance: 36.6 mL/min (A) (by C-G formula based on SCr of 1.88 mg/dL (H)). Liver Function Tests: Recent Labs  Lab 12/28/21 1117 01/01/22 1524  AST 25 31  ALT 28 23  ALKPHOS 68 60  BILITOT 0.6 1.1  PROT  --  7.7  ALBUMIN 4.3 3.8   No results for input(s): LIPASE, AMYLASE in the last 168 hours. No results for input(s): AMMONIA in the last 168 hours. Coagulation profile Recent Labs  Lab 01/01/22 1524  INR 1.0    CBC: Recent Labs  Lab 12/28/21 1117 01/01/22 1524 01/02/22 0358 01/03/22 0424  WBC 7.2 5.7 7.7 6.7  NEUTROABS 4.6 3.9  --   --   HGB 14.5 13.9 11.8* 11.8*  HCT 44.6 42.5 36.8* 36.8*  MCV 86 86.9 87.2 86.8  PLT 271 127* 135* 121*   Cardiac Enzymes: Recent Labs  Lab 01/01/22 1524  CKTOTAL 242   BNP (last 3 results) No results for input(s): PROBNP in the last 8760 hours. CBG: Recent Labs  Lab 01/02/22 2006 01/02/22 2343 01/02/22 2348 01/03/22 0407 01/03/22 0735  GLUCAP 110* 118* 129* 112* 108*   D-Dimer: No results for input(s): DDIMER in the last 72 hours. Hgb A1c: Recent Labs    01/02/22 0358  HGBA1C 5.8*   Lipid Profile: No results for input(s): CHOL, HDL, LDLCALC, TRIG, CHOLHDL, LDLDIRECT in the last 72 hours. Thyroid function studies: Recent Labs    01/01/22 1834  TSH 3.840  T4TOTAL 5.5    Anemia work up: No results for input(s): VITAMINB12, FOLATE, FERRITIN, TIBC, IRON, RETICCTPCT in the last 72 hours. Sepsis Labs: Recent Labs  Lab 12/28/21 1117 01/01/22 1524 01/01/22 1834 01/02/22 0358 01/03/22 0424  PROCALCITON  --  <0.10  --  <  0.10 <0.10  WBC 7.2 5.7  --  7.7 6.7  LATICACIDVEN  --  1.8 1.6  --   --     Microbiology Recent Results (from the past 240 hour(s))  Resp Panel by RT-PCR (Flu A&B, Covid) Nasopharyngeal Swab     Status: None   Collection Time: 01/01/22  3:24 PM   Specimen: Nasopharyngeal Swab; Nasopharyngeal(NP) swabs in vial transport medium  Result Value Ref Range Status   SARS Coronavirus 2 by RT PCR NEGATIVE NEGATIVE Final    Comment: (NOTE) SARS-CoV-2 target nucleic acids are NOT DETECTED.  The SARS-CoV-2 RNA is generally detectable in upper respiratory specimens during the acute phase of infection. The lowest concentration of SARS-CoV-2 viral copies this assay can detect is 138 copies/mL. A negative result does not preclude SARS-Cov-2 infection and should not be used as the sole basis for treatment or other patient management decisions. A negative result may occur with  improper specimen collection/handling, submission of specimen other than nasopharyngeal swab, presence of viral mutation(s) within the areas targeted by this assay, and inadequate number of viral copies(<138 copies/mL). A negative result must be combined with clinical observations, patient history, and epidemiological information. The expected result is Negative.  Fact Sheet for Patients:  EntrepreneurPulse.com.au  Fact Sheet for Healthcare Providers:  IncredibleEmployment.be  This test is no t yet approved or cleared by the [place] FDA and  has been authorized for detection and/or diagnosis of SARS-CoV-2 by FDA under an Emergency Use Authorization (EUA). This EUA will remain  in effect (meaning this test can be used) for the  duration of the COVID-19 declaration under Section 564(b)(1) of the Act, 21 U.S.C.section 360bbb-3(b)(1), unless the authorization is terminated  or revoked sooner.       Influenza A by PCR NEGATIVE NEGATIVE Final   Influenza B by PCR NEGATIVE NEGATIVE Final    Comment: (NOTE) The Xpert Xpress SARS-CoV-2/FLU/RSV plus assay is intended as an aid in the diagnosis of influenza from Nasopharyngeal swab specimens and should not be used as a sole basis for treatment. Nasal washings and aspirates are unacceptable for Xpert Xpress SARS-CoV-2/FLU/RSV testing.  Fact Sheet for Patients: EntrepreneurPulse.com.au  Fact Sheet for Healthcare Providers: IncredibleEmployment.be  This test is not yet approved or cleared by the [place] FDA and has been authorized for detection and/or diagnosis of SARS-CoV-2 by FDA under an Emergency Use Authorization (EUA). This EUA will remain in effect (meaning this test can be used) for the duration of the COVID-19 declaration under Section 564(b)(1) of the Act, 21 U.S.C. section 360bbb-3(b)(1), unless the authorization is terminated or revoked.  Performed at [facility], [address] [place], [place] [address]   Culture, blood (routine x 2)     Status: None (Preliminary result)   Collection Time: 01/01/22  3:24 PM   Specimen: BLOOD  Result Value Ref Range Status   Specimen Description BLOOD RIGHT ANTECUBITAL  Final   Special Requests   Final    BOTTLES DRAWN AEROBIC AND ANAEROBIC Blood Culture results may not be optimal due to an excessive volume of blood received in culture bottles   Culture   Final    NO GROWTH < 24 HOURS Performed at [facility], [address] [place], [place] [address]    Report Status PENDING  Incomplete  Culture, blood (routine x 2)     Status: None (Preliminary result)   Collection Time: 01/01/22  3:24 PM   Specimen: BLOOD  Result Value Ref Range Status  Specimen Description BLOOD BLOOD RIGHT FOREARM  Final   Special Requests   Final    BOTTLES DRAWN AEROBIC AND ANAEROBIC Blood Culture adequate volume   Culture   Final    NO GROWTH < 24 HOURS Performed at [facility], [address], [place], [place] [address]    Report Status PENDING  Incomplete  Urine Culture     Status: None   Collection Time: 01/01/22  3:24 PM   Specimen: In/Out Cath Urine  Result Value Ref Range Status   Specimen Description   Final    IN/OUT CATH URINE Performed at [facility], [address], [place], [place] [address]    Special Requests   Final    NONE Performed at [facility], [address], [place], [place] [address]    Culture   Final    NO GROWTH Performed at [facility], [place] [address] [place], [place] [address]    Report Status 01/03/2022 FINAL  Final  MRSA Next Gen by PCR, Nasal     Status: None   Collection Time: 01/01/22  6:14 PM   Specimen: Nasal Mucosa; Nasal Swab  Result Value Ref Range Status   MRSA by PCR Next Gen NOT DETECTED NOT DETECTED Final    Comment: (NOTE) The GeneXpert MRSA Assay (FDA approved for NASAL specimens only), is one component of a comprehensive MRSA colonization surveillance program. It is not intended to diagnose MRSA infection nor to guide or monitor treatment for MRSA infections. Test performance is not FDA approved in patients less than 2 years old. Performed at [facility], [address] [place], [place] [address]     Procedures and diagnostic studies:  MR BRAIN WO CONTRAST  Result Date: 01/02/2022 CLINICAL DATA:  Encephalopathy EXAM: MRI HEAD WITHOUT CONTRAST TECHNIQUE: Multiplanar, multiecho pulse sequences of the brain and surrounding structures were obtained without intravenous contrast. COMPARISON:  06/24/2018 FINDINGS: Brain: No acute infarct, mass effect or extra-axial collection. No acute or chronic hemorrhage. There is multifocal hyperintense  T2-weighted signal within the white matter. Generalized volume loss without a clear lobar predilection. The midline structures are normal. Vascular: Major flow voids are preserved. Skull and upper cervical spine: Normal calvarium and skull base. Visualized upper cervical spine and soft tissues are normal. Sinuses/Orbits:No paranasal sinus fluid levels or advanced mucosal thickening. No mastoid or middle ear effusion. Normal orbits. IMPRESSION: 1. No acute intracranial abnormality. 2. Generalized volume loss and findings of chronic microvascular ischemia. Electronically Signed   By: Ulyses Jarred M.D.   On: 01/02/2022 00:50   DG Chest Port 1 View  Result Date: 01/02/2022 CLINICAL DATA:  76 year old male with respiratory failure. Intubated. EXAM: PORTABLE CHEST 1 VIEW COMPARISON:  Portable chest 01/01/2022 and earlier. FINDINGS: Portable AP upright view at 0422 hours. Endotracheal tube tip in good position between the clavicles and carina. Enteric tube looped in the stomach. Mildly lower lung volumes. Mediastinal contours remain normal. Allowing for portable technique the lungs are clear. No pneumothorax or pleural effusion. No acute osseous abnormality identified. IMPRESSION: 1.  Stable lines and tubes. 2. No acute cardiopulmonary abnormality. Electronically Signed   By: Genevie Ann M.D.   On: 01/02/2022 08:10   DG Chest Portable 1 View  Result Date: 01/01/2022 CLINICAL DATA:  AMS, intubation EXAM: PORTABLE CHEST 1 VIEW.  Patient is rotated. COMPARISON:  Chest x-ray 12/16/2021 FINDINGS: Endotracheal tube with tip terminating 6 cm above the carina. Enteric tube coursing below the hemidiaphragm with tip  and side port overlying the expected region of the gastric lumen. The heart and mediastinal contours are unchanged. Atherosclerotic plaque. No focal consolidation. Similar increased interstitial markings. Blunting of left costophrenic angle with trace pleural effusion not excluded. No right pleural effusion. No  pneumothorax. No acute osseous abnormality. IMPRESSION: 1. Persistent increased interstitial markings. 2. Blunting of left costophrenic angle with trace pleural effusion not excluded. 3.  Aortic Atherosclerosis (ICD10-I70.0). Electronically Signed   By: Iven Finn M.D.   On: 01/01/2022 17:21   ECHOCARDIOGRAM COMPLETE  Result Date: 01/02/2022    ECHOCARDIOGRAM REPORT   Patient Name:   Luis Strong Date of Exam: 01/02/2022 Medical Rec #:  [id number]         Height:       69.0 in Accession #:    [id number]        Weight:       194.4 lb Date of Birth:  07-19-1946        BSA:          2.041 m Patient Age:    59 years          BP:           108/59 mmHg Patient Gender: M                 HR:           87 bpm. Exam Location:  [facility] Procedure: 2D Echo, Color Doppler and Cardiac Doppler Indications:     I63.9 Stroke  History:         Patient has prior history of Echocardiogram examinations, most                  recent 12/12/2021. Risk Factors:Hypertension, Dyslipidemia and                  Sleep Apnea.  Sonographer:     Charmayne Sheer Referring Phys:  [id number] Bradly Bienenstock Diagnosing Phys: Serafina Royals MD  Sonographer Comments: Echo performed with patient supine and on artificial respirator, suboptimal parasternal window and no subcostal window. Image acquisition challenging due to respiratory motion. IMPRESSIONS  1. Left ventricular ejection fraction, by estimation, is 60 to 65%. The left ventricle has normal function. The left ventricle has no regional wall motion abnormalities. Left ventricular diastolic parameters were normal.  2. Right ventricular systolic function is normal. The right ventricular size is normal.  3. The mitral valve is normal in structure. Trivial mitral valve regurgitation.  4. The aortic valve is normal in structure. Aortic valve regurgitation is not visualized. FINDINGS  Left Ventricle: Left ventricular ejection fraction, by estimation, is 60 to 65%. The left ventricle has normal  function. The left ventricle has no regional wall motion abnormalities. The left ventricular internal cavity size was normal in size. There is  no left ventricular hypertrophy. Left ventricular diastolic parameters were normal. Right Ventricle: The right ventricular size is normal. No increase in right ventricular wall thickness. Right ventricular systolic function is normal. Left Atrium: Left atrial size was normal in size. Right Atrium: Right atrial size was normal in size. Pericardium: There is no evidence of pericardial effusion. Mitral Valve: The mitral valve is normal in structure. Trivial mitral valve regurgitation. MV peak gradient, 2.5 mmHg. The mean mitral valve gradient is 1.0 mmHg. Tricuspid Valve: The tricuspid valve is normal in structure. Tricuspid valve regurgitation is trivial. Aortic Valve: The aortic valve is normal in structure. Aortic valve regurgitation is not visualized. Aortic valve mean  gradient measures 5.0 mmHg. Aortic valve peak gradient measures 8.6 mmHg. Aortic valve area, by VTI measures 2.73 cm. Pulmonic Valve: The pulmonic valve was normal in structure. Pulmonic valve regurgitation is not visualized. Aorta: The aortic root and ascending aorta are structurally normal, with no evidence of dilitation. IAS/Shunts: No atrial level shunt detected by color flow Doppler.  LEFT VENTRICLE PLAX 2D LVIDd:         4.83 cm   Diastology LVIDs:         2.84 cm   LV e' medial:    7.51 cm/s LV PW:         1.36 cm   LV E/e' medial:  9.7 LV IVS:        0.95 cm   LV e' lateral:   8.05 cm/s LVOT diam:     2.30 cm   LV E/e' lateral: 9.0 LV SV:         66 LV SV Index:   32 LVOT Area:     4.15 cm  RIGHT VENTRICLE RV Basal diam:  3.00 cm RV Mid diam:    3.14 cm LEFT ATRIUM             Index        RIGHT ATRIUM           Index LA diam:        4.80 cm 2.35 cm/m   RA Area:     26.60 cm LA Vol (A2C):   37.3 ml 18.27 ml/m  RA Volume:   85.30 ml  41.78 ml/m LA Vol (A4C):   64.4 ml 31.55 ml/m LA Biplane Vol:  49.3 ml 24.15 ml/m  AORTIC VALVE                     PULMONIC VALVE AV Area (Vmax):    2.67 cm      PV Vmax:       1.13 m/s AV Area (Vmean):   2.68 cm      PV Vmean:      76.800 cm/s AV Area (VTI):     2.73 cm      PV VTI:        0.186 m AV Vmax:           147.00 cm/s   PV Peak grad:  5.1 mmHg AV Vmean:          102.000 cm/s  PV Mean grad:  3.0 mmHg AV VTI:            0.242 m AV Peak Grad:      8.6 mmHg AV Mean Grad:      5.0 mmHg LVOT Vmax:         94.60 cm/s LVOT Vmean:        65.700 cm/s LVOT VTI:          0.159 m LVOT/AV VTI ratio: 0.66  AORTA Ao Root diam: 4.00 cm MITRAL VALVE MV Area (PHT): 5.27 cm    SHUNTS MV Area VTI:   3.33 cm    Systemic VTI:  0.16 m MV Peak grad:  2.5 mmHg    Systemic Diam: 2.30 cm MV Mean grad:  1.0 mmHg MV Vmax:       0.78 m/s MV Vmean:      57.7 cm/s MV Decel Time: 144 msec MV E velocity: 72.80 cm/s MV A velocity: 65.10 cm/s MV E/A ratio:  1.12 Serafina Royals MD Electronically signed by Serafina Royals MD Signature  Date/Time: 01/02/2022/12:26:21 PM    Final    CT HEAD CODE STROKE WO CONTRAST  Result Date: 01/01/2022 CLINICAL DATA:  Code stroke.  Neuro deficit, acute, stroke suspected EXAM: CT HEAD WITHOUT CONTRAST TECHNIQUE: Contiguous axial images were obtained from the base of the skull through the vertex without intravenous contrast. RADIATION DOSE REDUCTION: This exam was performed according to the departmental dose-optimization program which includes automated exposure control, adjustment of the mA and/or kV according to patient size and/or use of iterative reconstruction technique. COMPARISON:  CT 06/23/2018 FINDINGS: Brain: Small remote appearing infarct in the right basal ganglia, new since 2019. No evidence of acute hemorrhage, mass lesion, midline shift, or hydrocephalus. Mild patchy white matter hypoattenuation, nonspecific but compatible with chronic microvascular ischemic disease. Mild atrophy. Vascular: No hyperdense vessel identified. Calcific intracranial  atherosclerosis. Skull: No acute fracture. Sinuses/Orbits: Clear sinuses.  Unremarkable orbits Other: No mastoid effusions. IMPRESSION: 1. Small perforator infarct in the right basal ganglia, favored remote but new since 2019. An MRI could provide more sensitive evaluation for acute infarct if clinically warranted. 2. No acute hemorrhage. Findings discussed with Dr. Theda Sers via telephone at 4 p.m. Electronically Signed   By: Margaretha Sheffield M.D.   On: 01/01/2022 16:03   CT ANGIO HEAD NECK W WO CM (CODE STROKE)  Result Date: 01/01/2022 CLINICAL DATA:  Provided history: Neuro deficit, acute, stroke suspected. Additional history provided: Altered mental status. EXAM: CT ANGIOGRAPHY HEAD AND NECK TECHNIQUE: Multidetector CT imaging of the head and neck was performed using the standard protocol during bolus administration of intravenous contrast. Multiplanar CT image reconstructions and MIPs were obtained to evaluate the vascular anatomy. Carotid stenosis measurements (when applicable) are obtained utilizing NASCET criteria, using the distal internal carotid diameter as the denominator. RADIATION DOSE REDUCTION: This exam was performed according to the departmental dose-optimization program which includes automated exposure control, adjustment of the mA and/or kV according to patient size and/or use of iterative reconstruction technique. CONTRAST:  73mL OMNIPAQUE IOHEXOL 350 MG/ML SOLN COMPARISON:  Noncontrast head CT performed earlier today 01/01/2022. FINDINGS: CTA NECK FINDINGS Aortic arch: Standard aortic branching. The visualized aortic arch is normal in caliber. Streak and beam hardening artifact arising from a dense left-sided contrast bolus partially obscures the left subclavian artery. Within this limitation, there is no appreciable hemodynamically significant stenosis within the innominate or proximal subclavian arteries. Right carotid system: CCA and ICA patent within the neck without significant  stenosis (50% or greater). Mild atherosclerotic plaque about the carotid bifurcation and within the proximal ICA. Left carotid system: CCA and ICA patent within the neck. Mild to moderate atherosclerotic plaque about the carotid bifurcation and within the proximal ICA. Resultant less than 50% stenosis within the proximal ICA. Vertebral arteries: Vertebral arteries codominant and patent within the neck without stenosis. Skeleton: Cervical spondylosis. Partially imaged thoracic levocurvature. No acute bony abnormality or aggressive osseous lesion. Other neck: No neck mass or cervical lymphadenopathy. Upper chest: No consolidation within the imaged lung apices. Mild linear atelectasis versus scarring within the imaged lungs. The ET tube terminates within the thoracic trachea, above the level of the carina. Partially visualized enteric tube. Review of the MIP images confirms the above findings CTA HEAD FINDINGS Anterior circulation: The intracranial internal carotid arteries are patent. Non-stenotic calcified plaque within both vessels. The M1 middle cerebral arteries are patent. No M2 proximal branch occlusion or high-grade proximal stenosis is identified. The anterior cerebral arteries are patent. No intracranial aneurysm is identified. No intracranial aneurysm is identified. Posterior  circulation: The intracranial vertebral arteries are patent. The basilar artery is patent. The posterior cerebral arteries are patent. Posterior communicating arteries are diminutive or absent, bilaterally Venous sinuses: Within the limitations of contrast timing, no convincing thrombus. Anatomic variants: As described Review of the MIP images confirms the above findings No emergent large vessel occlusion identified. These results were communicated to Dr. Theda Sers At 4:38 pmon 2/13/2023by text page via the [facility] messaging system. IMPRESSION: CTA neck: The common carotid, internal carotid and vertebral arteries are patent within the neck  without hemodynamically significant stenosis. Atherosclerotic plaque about the carotid bifurcations and within the proximal ICAs, left greater than right. CTA head: 1. No intracranial large vessel occlusion or proximal high-grade arterial stenosis identified. 2. Non-stenotic calcified plaque within the intracranial ICAs, bilaterally. Electronically Signed   By: Kellie Simmering D.O.   On: 01/01/2022 16:39               LOS: 2 days   Emireth Cockerham  [facility]   Pager on [email or web address]. If 7PM-7AM, please contact night-coverage at [email or web address]     01/03/2022, 9:27 AM

## 2022-01-03 NOTE — Progress Notes (Signed)
Patient is refusing chair alarm and bed alarm

## 2022-01-04 LAB — BASIC METABOLIC PANEL
Anion gap: 5 (ref 5–15)
BUN: 18 mg/dL (ref 8–23)
CO2: 24 mmol/L (ref 22–32)
Calcium: 8.5 mg/dL — ABNORMAL LOW (ref 8.9–10.3)
Chloride: 108 mmol/L (ref 98–111)
Creatinine, Ser: 1.94 mg/dL — ABNORMAL HIGH (ref 0.61–1.24)
GFR, Estimated: 35 mL/min — ABNORMAL LOW (ref >60)
Glucose, Bld: 103 mg/dL — ABNORMAL HIGH (ref 70–99)
Potassium: 3.9 mmol/L (ref 3.5–5.1)
Sodium: 137 mmol/L (ref 135–145)

## 2022-01-04 LAB — GLUCOSE, CAPILLARY
Glucose-Capillary: 105 mg/dL — ABNORMAL HIGH (ref 70–99)
Glucose-Capillary: 114 mg/dL — ABNORMAL HIGH (ref 70–99)
Glucose-Capillary: 137 mg/dL — ABNORMAL HIGH (ref 70–99)

## 2022-01-04 LAB — CBC
HCT: 37.3 % — ABNORMAL LOW (ref 39.0–52.0)
Hemoglobin: 12.2 g/dL — ABNORMAL LOW (ref 13.0–17.0)
MCH: 28.2 pg (ref 26.0–34.0)
MCHC: 32.7 g/dL (ref 30.0–36.0)
MCV: 86.3 fL (ref 80.0–100.0)
Platelets: 127 10*3/uL — ABNORMAL LOW (ref 150–400)
RBC: 4.32 MIL/uL (ref 4.22–5.81)
RDW: 13.7 % (ref 11.5–15.5)
WBC: 6.8 10*3/uL (ref 4.0–10.5)
nRBC: 0 % (ref 0.0–0.2)

## 2022-01-04 LAB — VITAMIN B12: Vitamin B-12: 1339 pg/mL — ABNORMAL HIGH (ref 180–914)

## 2022-01-04 NOTE — Discharge Summary (Addendum)
Physician Discharge Summary  Luis Strong CXK:[id number] DOB: 08/30/1946 DOA: 01/01/2022  PCP: Juline Patch, MD  Admit date: 01/01/2022 Discharge date: 01/04/2022  Discharge disposition: Home     Recommendations for Outpatient Follow-Up:   Follow-up with PCP in 1 week Follow-up with Dr. Bernardo Heater, urologist, in 1 week  Discharge Diagnosis:   Principal Problem:   Altered mental status, unspecified    Discharge Condition: Stable.  Diet recommendation:  Diet Order             Diet - low sodium heart healthy           Diet Heart Room service appropriate? Yes; Fluid consistency: Thin  Diet effective now                     Code Status: Full Code     Hospital Course:   Luis Strong is a 76 y.o. male with medical history significant for CKD stage IIIb, hypertension, CAD, stroke, OSA on CPAP, GI bleed in 2020 (EGD showed nonbleeding esophageal ulcer), nephrolithiasis s/p ureteroscopy with bilateral JJ stent placement on 12/05/2021.  He presented to the hospital because of acute change in mental status.   He was intubated and placed on mechanical ventilation for airway protection and was managed in the ICU.  He was found to have AKI.  He was extubated on 01/02/2022 and transferred to the [facility] hospitalist service on 01/03/2022.  His condition has improved.  He is tolerating room air and mental status is back to baseline.  He has been able to ambulate without any problems.  He is deemed stable for discharge to home today.  Discharge plan was discussed with the patient and his son, Mr. Luis Strong.         Medical Consultants:   Intensivist   Discharge Exam:    Vitals:   01/03/22 2027 01/04/22 0357 01/04/22 0500 01/04/22 0830  BP: (!) 141/83 (!) 141/73  138/84  Pulse: 83 73  77  Resp: 20 16  16   Temp: 98.5 F (36.9 C) 98 F (36.7 C)  97.6 F (36.4 C)  TempSrc:    Oral  SpO2: 99% 97%  98%  Weight:   85.8 kg   Height:         GEN: NAD SKIN:  No rash EYES: EOMI ENT: MMM CV: RRR PULM: CTA B ABD: soft, ND, NT, +BS CNS: AAO x 3, non focal EXT: No edema or tenderness   The results of significant diagnostics from this hospitalization (including imaging, microbiology, ancillary and laboratory) are listed below for reference.     Procedures and Diagnostic Studies:   MR BRAIN WO CONTRAST  Result Date: 01/02/2022 CLINICAL DATA:  Encephalopathy EXAM: MRI HEAD WITHOUT CONTRAST TECHNIQUE: Multiplanar, multiecho pulse sequences of the brain and surrounding structures were obtained without intravenous contrast. COMPARISON:  06/24/2018 FINDINGS: Brain: No acute infarct, mass effect or extra-axial collection. No acute or chronic hemorrhage. There is multifocal hyperintense T2-weighted signal within the white matter. Generalized volume loss without a clear lobar predilection. The midline structures are normal. Vascular: Major flow voids are preserved. Skull and upper cervical spine: Normal calvarium and skull base. Visualized upper cervical spine and soft tissues are normal. Sinuses/Orbits:No paranasal sinus fluid levels or advanced mucosal thickening. No mastoid or middle ear effusion. Normal orbits. IMPRESSION: 1. No acute intracranial abnormality. 2. Generalized volume loss and findings of chronic microvascular ischemia. Electronically Signed   By: Ulyses Jarred M.D.   On:  01/02/2022 00:50   DG Chest Port 1 View  Result Date: 01/02/2022 CLINICAL DATA:  76 year old male with respiratory failure. Intubated. EXAM: PORTABLE CHEST 1 VIEW COMPARISON:  Portable chest 01/01/2022 and earlier. FINDINGS: Portable AP upright view at 0422 hours. Endotracheal tube tip in good position between the clavicles and carina. Enteric tube looped in the stomach. Mildly lower lung volumes. Mediastinal contours remain normal. Allowing for portable technique the lungs are clear. No pneumothorax or pleural effusion. No acute osseous abnormality identified. IMPRESSION: 1.   Stable lines and tubes. 2. No acute cardiopulmonary abnormality. Electronically Signed   By: Genevie Ann M.D.   On: 01/02/2022 08:10   ECHOCARDIOGRAM COMPLETE  Result Date: 01/02/2022    ECHOCARDIOGRAM REPORT   Patient Name:   Luis Strong Date of Exam: 01/02/2022 Medical Rec #:  [id number]         Height:       69.0 in Accession #:    [id number]        Weight:       194.4 lb Date of Birth:  Aug 05, 1946        BSA:          2.041 m Patient Age:    67 years          BP:           108/59 mmHg Patient Gender: M                 HR:           87 bpm. Exam Location:  [facility] Procedure: 2D Echo, Color Doppler and Cardiac Doppler Indications:     I63.9 Stroke  History:         Patient has prior history of Echocardiogram examinations, most                  recent 12/12/2021. Risk Factors:Hypertension, Dyslipidemia and                  Sleep Apnea.  Sonographer:     Charmayne Sheer Referring Phys:  [id number] Bradly Bienenstock Diagnosing Phys: Serafina Royals MD  Sonographer Comments: Echo performed with patient supine and on artificial respirator, suboptimal parasternal window and no subcostal window. Image acquisition challenging due to respiratory motion. IMPRESSIONS  1. Left ventricular ejection fraction, by estimation, is 60 to 65%. The left ventricle has normal function. The left ventricle has no regional wall motion abnormalities. Left ventricular diastolic parameters were normal.  2. Right ventricular systolic function is normal. The right ventricular size is normal.  3. The mitral valve is normal in structure. Trivial mitral valve regurgitation.  4. The aortic valve is normal in structure. Aortic valve regurgitation is not visualized. FINDINGS  Left Ventricle: Left ventricular ejection fraction, by estimation, is 60 to 65%. The left ventricle has normal function. The left ventricle has no regional wall motion abnormalities. The left ventricular internal cavity size was normal in size. There is  no left ventricular hypertrophy.  Left ventricular diastolic parameters were normal. Right Ventricle: The right ventricular size is normal. No increase in right ventricular wall thickness. Right ventricular systolic function is normal. Left Atrium: Left atrial size was normal in size. Right Atrium: Right atrial size was normal in size. Pericardium: There is no evidence of pericardial effusion. Mitral Valve: The mitral valve is normal in structure. Trivial mitral valve regurgitation. MV peak gradient, 2.5 mmHg. The mean mitral valve gradient is 1.0 mmHg. Tricuspid Valve: The tricuspid valve is  normal in structure. Tricuspid valve regurgitation is trivial. Aortic Valve: The aortic valve is normal in structure. Aortic valve regurgitation is not visualized. Aortic valve mean gradient measures 5.0 mmHg. Aortic valve peak gradient measures 8.6 mmHg. Aortic valve area, by VTI measures 2.73 cm. Pulmonic Valve: The pulmonic valve was normal in structure. Pulmonic valve regurgitation is not visualized. Aorta: The aortic root and ascending aorta are structurally normal, with no evidence of dilitation. IAS/Shunts: No atrial level shunt detected by color flow Doppler.  LEFT VENTRICLE PLAX 2D LVIDd:         4.83 cm   Diastology LVIDs:         2.84 cm   LV e' medial:    7.51 cm/s LV PW:         1.36 cm   LV E/e' medial:  9.7 LV IVS:        0.95 cm   LV e' lateral:   8.05 cm/s LVOT diam:     2.30 cm   LV E/e' lateral: 9.0 LV SV:         66 LV SV Index:   32 LVOT Area:     4.15 cm  RIGHT VENTRICLE RV Basal diam:  3.00 cm RV Mid diam:    3.14 cm LEFT ATRIUM             Index        RIGHT ATRIUM           Index LA diam:        4.80 cm 2.35 cm/m   RA Area:     26.60 cm LA Vol (A2C):   37.3 ml 18.27 ml/m  RA Volume:   85.30 ml  41.78 ml/m LA Vol (A4C):   64.4 ml 31.55 ml/m LA Biplane Vol: 49.3 ml 24.15 ml/m  AORTIC VALVE                     PULMONIC VALVE AV Area (Vmax):    2.67 cm      PV Vmax:       1.13 m/s AV Area (Vmean):   2.68 cm      PV Vmean:       76.800 cm/s AV Area (VTI):     2.73 cm      PV VTI:        0.186 m AV Vmax:           147.00 cm/s   PV Peak grad:  5.1 mmHg AV Vmean:          102.000 cm/s  PV Mean grad:  3.0 mmHg AV VTI:            0.242 m AV Peak Grad:      8.6 mmHg AV Mean Grad:      5.0 mmHg LVOT Vmax:         94.60 cm/s LVOT Vmean:        65.700 cm/s LVOT VTI:          0.159 m LVOT/AV VTI ratio: 0.66  AORTA Ao Root diam: 4.00 cm MITRAL VALVE MV Area (PHT): 5.27 cm    SHUNTS MV Area VTI:   3.33 cm    Systemic VTI:  0.16 m MV Peak grad:  2.5 mmHg    Systemic Diam: 2.30 cm MV Mean grad:  1.0 mmHg MV Vmax:       0.78 m/s MV Vmean:      57.7 cm/s MV Decel Time: 144  msec MV E velocity: 72.80 cm/s MV A velocity: 65.10 cm/s MV E/A ratio:  1.12 Serafina Royals MD Electronically signed by Serafina Royals MD Signature Date/Time: 01/02/2022/12:26:21 PM    Final      Labs:   Basic Metabolic Panel: Recent Labs  Lab 01/01/22 1524 01/02/22 0358 01/03/22 0424 01/04/22 0346  NA 132* 134* 138 137  K 3.4* 4.8 3.9 3.9  CL 103 108 109 108  CO2 18* 21* 21* 24  GLUCOSE 116* 116* 102* 103*  BUN 26* 25* 19 18  CREATININE 2.20* 2.16* 1.88* 1.94*  CALCIUM 9.1 8.1* 8.7* 8.5*  MG  --  1.7 2.1  --   PHOS  --  3.7  --   --    GFR Estimated Creatinine Clearance: 35.1 mL/min (A) (by C-G formula based on SCr of 1.94 mg/dL (H)). Liver Function Tests: Recent Labs  Lab 01/01/22 1524  AST 31  ALT 23  ALKPHOS 60  BILITOT 1.1  PROT 7.7  ALBUMIN 3.8   No results for input(s): LIPASE, AMYLASE in the last 168 hours. No results for input(s): AMMONIA in the last 168 hours. Coagulation profile Recent Labs  Lab 01/01/22 1524  INR 1.0    CBC: Recent Labs  Lab 01/01/22 1524 01/02/22 0358 01/03/22 0424 01/04/22 0346  WBC 5.7 7.7 6.7 6.8  NEUTROABS 3.9  --   --   --   HGB 13.9 11.8* 11.8* 12.2*  HCT 42.5 36.8* 36.8* 37.3*  MCV 86.9 87.2 86.8 86.3  PLT 127* 135* 121* 127*   Cardiac Enzymes: Recent Labs  Lab 01/01/22 1524  CKTOTAL  242   BNP: Invalid input(s): POCBNP CBG: Recent Labs  Lab 01/03/22 1610 01/03/22 2154 01/04/22 0025 01/04/22 0358 01/04/22 0738  GLUCAP 119* 102* 137* 105* 114*   D-Dimer No results for input(s): DDIMER in the last 72 hours. Hgb A1c Recent Labs    01/02/22 0358  HGBA1C 5.8*   Lipid Profile No results for input(s): CHOL, HDL, LDLCALC, TRIG, CHOLHDL, LDLDIRECT in the last 72 hours. Thyroid function studies Recent Labs    01/01/22 1834  TSH 3.840  T4TOTAL 5.5   Anemia work up Recent Labs    01/04/22 1026  VITAMINB12 1,339*   Microbiology Recent Results (from the past 240 hour(s))  Resp Panel by RT-PCR (Flu A&B, Covid) Nasopharyngeal Swab     Status: None   Collection Time: 01/01/22  3:24 PM   Specimen: Nasopharyngeal Swab; Nasopharyngeal(NP) swabs in vial transport medium  Result Value Ref Range Status   SARS Coronavirus 2 by RT PCR NEGATIVE NEGATIVE Final    Comment: (NOTE) SARS-CoV-2 target nucleic acids are NOT DETECTED.  The SARS-CoV-2 RNA is generally detectable in upper respiratory specimens during the acute phase of infection. The lowest concentration of SARS-CoV-2 viral copies this assay can detect is 138 copies/mL. A negative result does not preclude SARS-Cov-2 infection and should not be used as the sole basis for treatment or other patient management decisions. A negative result may occur with  improper specimen collection/handling, submission of specimen other than nasopharyngeal swab, presence of viral mutation(s) within the areas targeted by this assay, and inadequate number of viral copies(<138 copies/mL). A negative result must be combined with clinical observations, patient history, and epidemiological information. The expected result is Negative.  Fact Sheet for Patients:  EntrepreneurPulse.com.au  Fact Sheet for Healthcare Providers:  IncredibleEmployment.be  This test is no t yet approved or cleared  by the [place] and  has been authorized  for detection and/or diagnosis of SARS-CoV-2 by FDA under an Emergency Use Authorization (EUA). This EUA will remain  in effect (meaning this test can be used) for the duration of the COVID-19 declaration under Section 564(b)(1) of the Act, 21 U.S.C.section 360bbb-3(b)(1), unless the authorization is terminated  or revoked sooner.       Influenza A by PCR NEGATIVE NEGATIVE Final   Influenza B by PCR NEGATIVE NEGATIVE Final    Comment: (NOTE) The Xpert Xpress SARS-CoV-2/FLU/RSV plus assay is intended as an aid in the diagnosis of influenza from Nasopharyngeal swab specimens and should not be used as a sole basis for treatment. Nasal washings and aspirates are unacceptable for Xpert Xpress SARS-CoV-2/FLU/RSV testing.  Fact Sheet for Patients: EntrepreneurPulse.com.au  Fact Sheet for Healthcare Providers: IncredibleEmployment.be  This test is not yet approved or cleared by the [place] FDA and has been authorized for detection and/or diagnosis of SARS-CoV-2 by FDA under an Emergency Use Authorization (EUA). This EUA will remain in effect (meaning this test can be used) for the duration of the COVID-19 declaration under Section 564(b)(1) of the Act, 21 U.S.C. section 360bbb-3(b)(1), unless the authorization is terminated or revoked.  Performed at [facility], [address] [place], [place] [address]   Culture, blood (routine x 2)     Status: None (Preliminary result)   Collection Time: 01/01/22  3:24 PM   Specimen: BLOOD  Result Value Ref Range Status   Specimen Description BLOOD RIGHT ANTECUBITAL  Final   Special Requests   Final    BOTTLES DRAWN AEROBIC AND ANAEROBIC Blood Culture results may not be optimal due to an excessive volume of blood received in culture bottles   Culture   Final    NO GROWTH 3 DAYS Performed at [facility], [address]  [place], [place] [address]    Report Status PENDING  Incomplete  Culture, blood (routine x 2)     Status: None (Preliminary result)   Collection Time: 01/01/22  3:24 PM   Specimen: BLOOD  Result Value Ref Range Status   Specimen Description BLOOD BLOOD RIGHT FOREARM  Final   Special Requests   Final    BOTTLES DRAWN AEROBIC AND ANAEROBIC Blood Culture adequate volume   Culture   Final    NO GROWTH 3 DAYS Performed at [facility], [address] [place], [place] [address]    Report Status PENDING  Incomplete  Urine Culture     Status: None   Collection Time: 01/01/22  3:24 PM   Specimen: In/Out Cath Urine  Result Value Ref Range Status   Specimen Description   Final    IN/OUT CATH URINE Performed at [facility], [address] [place], [place] [address]    Special Requests   Final    NONE Performed at [facility], [address] [place], [place] [address]    Culture   Final    NO GROWTH Performed at [facility], [place] [address], [place], [place] [address]    Report Status 01/03/2022 FINAL  Final  MRSA Next Gen by PCR, Nasal     Status: None   Collection Time: 01/01/22  6:14 PM   Specimen: Nasal Mucosa; Nasal Swab  Result Value Ref Range Status   MRSA by PCR Next Gen NOT DETECTED NOT DETECTED Final    Comment: (NOTE) The GeneXpert MRSA Assay (FDA approved for NASAL specimens only), is one component of a comprehensive MRSA colonization surveillance  program. It is not intended to diagnose MRSA infection nor to guide or monitor treatment for MRSA infections. Test performance is not FDA approved in patients less than 53 years old. Performed at [facility], [address] [place], [place] [address]      Discharge Instructions:   Discharge Instructions     Diet - low sodium heart healthy   Complete by: As directed    Face-to-face encounter (required for Medicare/Medicaid patients)   Complete by: As directed    I [place] certify that this patient is under my care and that I, or a nurse practitioner or physician's assistant working with me, had a face-to-face encounter that meets the physician face-to-face encounter requirements with this patient on 01/04/2022. The encounter with the patient was in whole, or in part for the following medical condition(s) which is the primary reason for home health care (List medical condition): Debility   The encounter with the patient was in whole, or in part, for the following medical condition, which is the primary reason for home health care: Debility   I certify that, based on my findings, the following services are medically necessary home health services: Physical therapy   Reason for Medically Necessary Home Health Services: Therapy- Personnel officer, Public librarian   My clinical findings support the need for the above services: Unsafe ambulation due to balance issues   Further, I certify that my clinical findings support that this patient is homebound due to: Unsafe ambulation due to balance issues   Home Health   Complete by: As directed    To provide the following care/treatments:  PT OT RN     Increase activity slowly   Complete by: As directed    No wound care   Complete by: As directed       Allergies as of 01/04/2022       Reactions   Capsaicin Itching, Rash   Severe rash and itching.        Medication List     TAKE these medications    acetaminophen 500 MG tablet Commonly known as: TYLENOL Take 1,000 mg by mouth every 6 (six) hours as needed for moderate pain.   aspirin EC 81 MG tablet Take 81 mg by mouth daily. Swallow whole.   cetirizine 10 MG tablet Commonly known as: ZYRTEC Take 10 mg by mouth as needed for allergies.   citalopram 40 MG tablet Commonly known as: CELEXA Take 1 tablet (40 mg total) by mouth daily.   diltiazem 180 MG 24 hr capsule Commonly known as: CARDIZEM CD Take 1 capsule (180 mg total) by  mouth daily.   Flaxseed Oil 1200 MG Caps Take 1,200 mg by mouth daily.   hydrocortisone cream 1 % Apply 1 application topically daily as needed for itching.   LUBRICATING EYE DROPS OP Place 1 drop into both eyes daily as needed (dry eyes).   Melatonin 3 MG Caps Take 3 mg by mouth at bedtime.   metoprolol succinate 25 MG 24 hr tablet Commonly known as: TOPROL-XL Take 1 tablet (25 mg total) by mouth at bedtime.   multivitamin with minerals Tabs tablet Take 1 tablet by mouth daily.   pantoprazole 40 MG tablet Commonly known as: PROTONIX TAKE 1 TABLET TWICE A DAY BEFORE MEALS   rosuvastatin 20 MG tablet Commonly known as: CRESTOR Take 1 tablet (20 mg total) by mouth at bedtime.        Follow-up Information  Abbie Sons, MD. Daphane Shepherd on 01/08/2022.   Specialty: Urology Why: @ 3pm Contact information: [address] [place] [address] [phone]                   If you experience worsening of your admission symptoms, develop shortness of breath, life threatening emergency, suicidal or homicidal thoughts you must seek medical attention immediately by calling 911 or calling your MD immediately  if symptoms less severe.   You must read complete instructions/literature along with all the possible adverse reactions/side effects for all the medicines you take and that have been prescribed to you. Take any new medicines after you have completely understood and accept all the possible adverse reactions/side effects.    Please note   You were cared for by a hospitalist during your hospital stay. If you have any questions about your discharge medications or the care you received while you were in the hospital after you are discharged, you can call the unit and asked to speak with the hospitalist on call if the hospitalist that took care of you is not available. Once you are discharged, your primary care physician will handle any further medical  issues. Please note that NO REFILLS for any discharge medications will be authorized once you are discharged, as it is imperative that you return to your primary care physician (or establish a relationship with a primary care physician if you do not have one) for your aftercare needs so that they can reassess your need for medications and monitor your lab values.       Time coordinating discharge: Greater than 30 minutes  Signed:  Analie Katzman  [facility] 01/04/2022, 3:37 PM   Pager on [email or web address]. If 7PM-7AM, please contact night-coverage at [email or web address]

## 2022-01-04 NOTE — TOC Transition Note (Signed)
Transition of Care [facility]) - CM/SW Discharge Note   Patient Details  Name: Luis Strong MRN: [id number] Date of Birth: [birth year]  [facility] CM/SW Contact:  Eileen Stanford, LCSW Phone Number: 01/04/2022, 3:28 PM   Clinical Narrative:   Pt is active with [facility] Jason aware of dc.    Final next level of care: [place] Barriers to Discharge: No Barriers Identified   Patient Goals and CMS Choice Patient states their goals for this hospitalization and ongoing recovery are:: to get home CMS Medicare.gov Compare Post Acute Care list provided to:: Patient Choice offered to / list presented to : Patient  Discharge Placement                    Patient and family notified of of transfer: 01/04/22  Discharge Plan and Services   Discharge Planning Services: CM Consult Post Acute Care Choice: Home Health, Resumption of Svcs/PTA Provider          DME Arranged: N/A DME Agency: NA       HH Arranged: PT, OT [place] Agency: [place] ([facility]) Date [place]: 01/03/22 Time [place]: [place] Representative spoke with at [place]: [place] ([place]) Interventions     Readmission Risk Interventions No flowsheet data found.

## 2022-01-04 NOTE — Care Management Important Message (Signed)
Important Message  Patient Details  Name: Luis Strong MRN: [id number] Date of Birth: July 02, 1946   Medicare Important Message Given:  Yes     Dannette Barbara 01/04/2022, 3:19 PM

## 2022-01-05 ENCOUNTER — Telehealth: Payer: Self-pay

## 2022-01-05 LAB — SEROTONIN RELEASE ASSAY (SRA)
SRA .2 IU/mL UFH Ser-aCnc: 6 % (ref 0–20)
SRA 100IU/mL UFH Ser-aCnc: 2 % (ref 0–20)

## 2022-01-05 NOTE — Telephone Encounter (Signed)
Transition Care Management Unsuccessful Follow-up Telephone Call  Date of discharge and from where:  01/04/22 [facility]  Attempts:  1st Attempt  Reason for unsuccessful TCM follow-up call:  Left voice message

## 2022-01-06 LAB — CULTURE, BLOOD (ROUTINE X 2)
Culture: NO GROWTH
Culture: NO GROWTH
Special Requests: ADEQUATE

## 2022-01-08 ENCOUNTER — Other Ambulatory Visit: Payer: Self-pay

## 2022-01-08 ENCOUNTER — Ambulatory Visit (INDEPENDENT_AMBULATORY_CARE_PROVIDER_SITE_OTHER): Payer: Medicare Other | Admitting: Urology

## 2022-01-08 ENCOUNTER — Encounter: Payer: Self-pay | Admitting: Urology

## 2022-01-08 VITALS — BP 125/73 | HR 66 | Ht 69.0 in | Wt 192.0 lb

## 2022-01-08 DIAGNOSIS — N200 Calculus of kidney: Secondary | ICD-10-CM | POA: Diagnosis not present

## 2022-01-08 LAB — URINALYSIS, COMPLETE
Bilirubin, UA: NEGATIVE
Glucose, UA: NEGATIVE
Ketones, UA: NEGATIVE
Nitrite, UA: NEGATIVE
Specific Gravity, UA: 1.02 (ref 1.005–1.030)
Urobilinogen, Ur: 0.2 mg/dL (ref 0.2–1.0)
pH, UA: 5.5 (ref 5.0–7.5)

## 2022-01-08 LAB — MICROSCOPIC EXAMINATION: RBC, Urine: >30 /hpf — AB (ref 0–2)
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
## 2022-01-12 ENCOUNTER — Other Ambulatory Visit: Payer: Self-pay | Admitting: Family Medicine

## 2022-01-12 DIAGNOSIS — N200 Calculus of kidney: Secondary | ICD-10-CM

## 2022-01-14 ENCOUNTER — Encounter: Payer: Self-pay | Admitting: Urology

## 2022-01-15 ENCOUNTER — Other Ambulatory Visit: Payer: Self-pay

## 2022-01-15 ENCOUNTER — Other Ambulatory Visit: Payer: Medicare Other

## 2022-01-15 ENCOUNTER — Ambulatory Visit: Payer: Medicare Other | Admitting: Family Medicine

## 2022-01-15 ENCOUNTER — Other Ambulatory Visit: Payer: Self-pay | Admitting: Family Medicine

## 2022-01-15 DIAGNOSIS — E782 Mixed hyperlipidemia: Secondary | ICD-10-CM

## 2022-01-15 DIAGNOSIS — N200 Calculus of kidney: Secondary | ICD-10-CM | POA: Diagnosis not present

## 2022-01-15 LAB — URINALYSIS, COMPLETE
Bilirubin, UA: NEGATIVE
Glucose, UA: NEGATIVE
Ketones, UA: NEGATIVE
Nitrite, UA: NEGATIVE
Protein,UA: NEGATIVE
Specific Gravity, UA: 1.015 (ref 1.005–1.030)
Urobilinogen, Ur: 0.2 mg/dL (ref 0.2–1.0)
pH, UA: 7 (ref 5.0–7.5)

## 2022-01-15 LAB — MICROSCOPIC EXAMINATION
Bacteria, UA: NONE SEEN
RBC, Urine: >30 /hpf — AB (ref 0–2)

## 2022-01-16 NOTE — Telephone Encounter (Signed)
Requested Prescriptions  Pending Prescriptions Disp Refills   rosuvastatin (CRESTOR) 20 MG tablet [Pharmacy Med Name: ROSUVASTATIN TABS 20MG ] 90 tablet 1    Sig: TAKE 1 TABLET AT BEDTIME     Cardiovascular:  Antilipid - Statins 2 Failed - 01/15/2022  3:18 AM      Failed - Cr in normal range and within 360 days    Creatinine  Date Value Ref Range Status  11/11/2014 1.61 (H) 0.60 - 1.30 mg/dL Final   Creatinine, Ser  Date Value Ref Range Status  01/04/2022 1.94 (H) 0.61 - 1.24 mg/dL Final         Failed - Lipid Panel in normal range within the last 12 months    Cholesterol, Total  Date Value Ref Range Status  07/17/2021 139 100 - 199 mg/dL Final   LDL Chol Calc (NIH)  Date Value Ref Range Status  07/17/2021 85 0 - 99 mg/dL Final   HDL  Date Value Ref Range Status  07/17/2021 37 (L) >39 mg/dL Final   Triglycerides  Date Value Ref Range Status  07/17/2021 91 0 - 149 mg/dL Final         Passed - Patient is not pregnant      Passed - Valid encounter within last 12 months    Recent Outpatient Visits          2 weeks ago Hospital discharge follow-up   [place], Deanna C, MD   6 months ago Essential hypertension   [place], Deanna C, MD   11 months ago Essential hypertension   [facility] Juline Patch, MD   1 year ago Need for immunization against influenza   [place], Deanna C, MD   2 years ago Essential hypertension   [employer], Deanna C, MD      Future Appointments            In 5 months Juline Patch, MD [facility], [place]   In 9 months [place], Ronda Fairly, MD [facility]

## 2022-01-17 DIAGNOSIS — Z20822 Contact with and (suspected) exposure to covid-19: Secondary | ICD-10-CM | POA: Diagnosis not present

## 2022-01-18 LAB — CULTURE, URINE COMPREHENSIVE

## 2022-01-19 ENCOUNTER — Encounter: Payer: Self-pay | Admitting: Urology

## 2022-01-19 ENCOUNTER — Other Ambulatory Visit: Payer: Self-pay

## 2022-01-19 ENCOUNTER — Ambulatory Visit (INDEPENDENT_AMBULATORY_CARE_PROVIDER_SITE_OTHER): Payer: Medicare Other | Admitting: Urology

## 2022-01-19 VITALS — BP 130/69 | HR 61 | Ht 69.0 in | Wt 195.0 lb

## 2022-01-19 DIAGNOSIS — N200 Calculus of kidney: Secondary | ICD-10-CM | POA: Diagnosis not present

## 2022-01-19 LAB — URINALYSIS, COMPLETE
Bilirubin, UA: NEGATIVE
Glucose, UA: NEGATIVE
Ketones, UA: NEGATIVE
Nitrite, UA: NEGATIVE
Specific Gravity, UA: 1.015 (ref 1.005–1.030)
Urobilinogen, Ur: 0.2 mg/dL (ref 0.2–1.0)
pH, UA: 7 (ref 5.0–7.5)

## 2022-01-19 LAB — MICROSCOPIC EXAMINATION
Bacteria, UA: NONE SEEN
RBC, Urine: >30 /hpf — AB (ref 0–2)

## 2022-01-19 MED ORDER — AMOXICILLIN 875 MG PO TABS: 875.0000 mg | ORAL_TABLET | Freq: Once | ORAL | 0 refills | Status: AC

## 2022-01-19 NOTE — Progress Notes (Signed)
Indications: Patient is 76 y.o., who is s/p ureteroscopic removal of multiple right renal calculi 12/05/2021.  Refer to my prior note 01/08/2022.  Urine culture returned mixed flora the patient is presenting today for stent removal. ? ?Procedure:  Flexible Cystoscopy with stent removal (15400) ? ?Timeout was performed and the correct patient, procedure and participants were identified.   ? ?Description:  The patient was prepped and draped in the usual sterile fashion. Flexible cystosopy was performed.  The stent was visualized, grasped, and removed intact without difficulty. The patient tolerated the procedure well.  A single dose of oral antibiotics was given. ? ?Complications:  None ? ?Plan:  ?Call for fever/flank pain post stent removal ?Follow-up 6 months with K[facility] ? ? ?John Giovanni, MD ? ?

## 2022-01-22 ENCOUNTER — Other Ambulatory Visit: Payer: Self-pay | Admitting: Family Medicine

## 2022-01-22 DIAGNOSIS — F4323 Adjustment disorder with mixed anxiety and depressed mood: Secondary | ICD-10-CM

## 2022-02-26 DIAGNOSIS — R809 Proteinuria, unspecified: Secondary | ICD-10-CM | POA: Diagnosis not present

## 2022-02-26 DIAGNOSIS — I10 Essential (primary) hypertension: Secondary | ICD-10-CM | POA: Diagnosis not present

## 2022-02-26 DIAGNOSIS — N1832 Chronic kidney disease, stage 3b: Secondary | ICD-10-CM | POA: Diagnosis not present

## 2022-02-27 ENCOUNTER — Other Ambulatory Visit: Payer: Self-pay | Admitting: Family Medicine

## 2022-02-27 DIAGNOSIS — I10 Essential (primary) hypertension: Secondary | ICD-10-CM

## 2022-03-01 ENCOUNTER — Encounter: Payer: Self-pay | Admitting: Family Medicine

## 2022-03-01 ENCOUNTER — Ambulatory Visit (INDEPENDENT_AMBULATORY_CARE_PROVIDER_SITE_OTHER): Payer: Medicare Other | Admitting: Family Medicine

## 2022-03-01 VITALS — BP 130/80 | HR 64 | Ht 69.0 in | Wt 203.0 lb

## 2022-03-01 DIAGNOSIS — I10 Essential (primary) hypertension: Secondary | ICD-10-CM | POA: Diagnosis not present

## 2022-03-01 DIAGNOSIS — K219 Gastro-esophageal reflux disease without esophagitis: Secondary | ICD-10-CM | POA: Diagnosis not present

## 2022-03-01 DIAGNOSIS — E782 Mixed hyperlipidemia: Secondary | ICD-10-CM

## 2022-03-01 DIAGNOSIS — F4323 Adjustment disorder with mixed anxiety and depressed mood: Secondary | ICD-10-CM | POA: Diagnosis not present

## 2022-03-01 MED ORDER — CITALOPRAM HYDROBROMIDE 40 MG PO TABS: 40.0000 mg | ORAL_TABLET | Freq: Every day | ORAL | 1 refills | Status: DC

## 2022-03-01 MED ORDER — ROSUVASTATIN CALCIUM 20 MG PO TABS: 20.0000 mg | ORAL_TABLET | Freq: Every day | ORAL | 1 refills | Status: DC

## 2022-03-01 MED ORDER — PANTOPRAZOLE SODIUM 40 MG PO TBEC: DELAYED_RELEASE_TABLET | ORAL | 1 refills | Status: DC
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
## 2022-03-06 DIAGNOSIS — Z20822 Contact with and (suspected) exposure to covid-19: Secondary | ICD-10-CM | POA: Diagnosis not present

## 2022-03-12 ENCOUNTER — Emergency Department: Payer: Medicare Other

## 2022-03-12 ENCOUNTER — Other Ambulatory Visit: Payer: Self-pay

## 2022-03-12 ENCOUNTER — Ambulatory Visit
Admission: EM | Admit: 2022-03-12 | Discharge: 2022-03-12 | Disposition: A | Discharge status: Other Acute Inpatient Hospital | Payer: Medicare Other

## 2022-03-12 ENCOUNTER — Emergency Department
Admission: EM | Admit: 2022-03-12 | Discharge: 2022-03-12 | Disposition: A | Discharge status: Home / Self Care | Payer: Medicare Other | Attending: Emergency Medicine | Admitting: Emergency Medicine

## 2022-03-12 DIAGNOSIS — Z8673 Personal history of transient ischemic attack (TIA), and cerebral infarction without residual deficits: Secondary | ICD-10-CM

## 2022-03-12 DIAGNOSIS — N189 Chronic kidney disease, unspecified: Secondary | ICD-10-CM | POA: Diagnosis not present

## 2022-03-12 DIAGNOSIS — R29810 Facial weakness: Secondary | ICD-10-CM | POA: Diagnosis not present

## 2022-03-12 DIAGNOSIS — R410 Disorientation, unspecified: Secondary | ICD-10-CM | POA: Insufficient documentation

## 2022-03-12 DIAGNOSIS — R404 Transient alteration of awareness: Secondary | ICD-10-CM | POA: Diagnosis not present

## 2022-03-12 DIAGNOSIS — I6381 Other cerebral infarction due to occlusion or stenosis of small artery: Secondary | ICD-10-CM | POA: Diagnosis not present

## 2022-03-12 DIAGNOSIS — R519 Headache, unspecified: Secondary | ICD-10-CM

## 2022-03-12 DIAGNOSIS — G459 Transient cerebral ischemic attack, unspecified: Secondary | ICD-10-CM | POA: Diagnosis not present

## 2022-03-12 DIAGNOSIS — R29818 Other symptoms and signs involving the nervous system: Secondary | ICD-10-CM | POA: Diagnosis not present

## 2022-03-12 DIAGNOSIS — I129 Hypertensive chronic kidney disease with stage 1 through stage 4 chronic kidney disease, or unspecified chronic kidney disease: Secondary | ICD-10-CM | POA: Insufficient documentation

## 2022-03-12 DIAGNOSIS — R531 Weakness: Secondary | ICD-10-CM | POA: Diagnosis not present

## 2022-03-12 LAB — COMPREHENSIVE METABOLIC PANEL
ALT: 20 U/L (ref 0–44)
AST: 25 U/L (ref 15–41)
Albumin: 4.2 g/dL (ref 3.5–5.0)
Alkaline Phosphatase: 36 U/L — ABNORMAL LOW (ref 38–126)
Anion gap: 7 (ref 5–15)
BUN: 17 mg/dL (ref 8–23)
CO2: 25 mmol/L (ref 22–32)
Calcium: 9.5 mg/dL (ref 8.9–10.3)
Chloride: 104 mmol/L (ref 98–111)
Creatinine, Ser: 1.63 mg/dL — ABNORMAL HIGH (ref 0.61–1.24)
GFR, Estimated: 43 mL/min — ABNORMAL LOW (ref >60)
Glucose, Bld: 109 mg/dL — ABNORMAL HIGH (ref 70–99)
Potassium: 3.7 mmol/L (ref 3.5–5.1)
Sodium: 136 mmol/L (ref 135–145)
Total Bilirubin: 1.3 mg/dL — ABNORMAL HIGH (ref 0.3–1.2)
Total Protein: 7.3 g/dL (ref 6.5–8.1)

## 2022-03-12 LAB — DIFFERENTIAL
Abs Immature Granulocytes: 0.05 10*3/uL (ref 0.00–0.07)
Basophils Absolute: 0 10*3/uL (ref 0.0–0.1)
Basophils Relative: 1 %
Eosinophils Absolute: 0.3 10*3/uL (ref 0.0–0.5)
Eosinophils Relative: 3 %
Immature Granulocytes: 1 %
Lymphocytes Relative: 16 %
Lymphs Abs: 1.2 10*3/uL (ref 0.7–4.0)
Monocytes Absolute: 0.7 10*3/uL (ref 0.1–1.0)
Monocytes Relative: 9 %
Neutro Abs: 5.6 10*3/uL (ref 1.7–7.7)
Neutrophils Relative %: 70 %

## 2022-03-12 LAB — CBC
HCT: 40.6 % (ref 39.0–52.0)
Hemoglobin: 13.3 g/dL (ref 13.0–17.0)
MCH: 29.2 pg (ref 26.0–34.0)
MCHC: 32.8 g/dL (ref 30.0–36.0)
MCV: 89.2 fL (ref 80.0–100.0)
Platelets: 127 10*3/uL — ABNORMAL LOW (ref 150–400)
RBC: 4.55 MIL/uL (ref 4.22–5.81)
RDW: 14.2 % (ref 11.5–15.5)
WBC: 7.9 10*3/uL (ref 4.0–10.5)
nRBC: 0 % (ref 0.0–0.2)

## 2022-03-12 LAB — URINALYSIS, ROUTINE W REFLEX MICROSCOPIC
Bilirubin Urine: NEGATIVE
Glucose, UA: NEGATIVE mg/dL
Hgb urine dipstick: NEGATIVE
Ketones, ur: NEGATIVE mg/dL
Leukocytes,Ua: NEGATIVE
Nitrite: NEGATIVE
Protein, ur: NEGATIVE mg/dL
Specific Gravity, Urine: 1.017 (ref 1.005–1.030)
pH: 7 (ref 5.0–8.0)

## 2022-03-12 LAB — PROTIME-INR
INR: 1 (ref 0.8–1.2)
Prothrombin Time: 13.3 seconds (ref 11.4–15.2)

## 2022-03-12 LAB — APTT: aPTT: 33 seconds (ref 24–36)

## 2022-03-12 MED ORDER — SODIUM CHLORIDE 0.9% FLUSH
3.0000 mL | Freq: Once | INTRAVENOUS | Status: AC
  Administered 2022-03-12: 3 mL via INTRAVENOUS

## 2022-03-12 NOTE — ED Provider Triage Note (Signed)
Emergency Medicine Provider Triage Evaluation Note ? ?Luis Strong , a 76 y.o. male  was evaluated in triage.  Pt complains of confusion, facial droop, altered since his. ? ?Review of Systems  ?Positive:  ?Negative:  ? ?Physical Exam  ?BP 124/66 (BP Location: Left Arm)   Pulse 78   Temp 98.4 ?F (36.9 ?C) (Oral)   Resp 18   Ht '5\' 9"'$  (1.753 m)   Wt 92.1 kg   SpO2 96%   BMI 29.98 kg/m?  ?Gen:   Awake, no distress   ?Resp:  Normal effort  ?MSK:   Moves extremities without difficulty  ?Other:  Smile is not symmetrical ? ?Medical Decision Making  ?Medically screening exam initiated at 5:50 PM.  Appropriate orders placed.  Luis Strong was informed that the remainder of the evaluation will be completed by another provider, this initial triage assessment does not replace that evaluation, and the importance of remaining in the ED until their evaluation is complete. ? ?Patient's son states he is confused.  States his smile is usually symmetrical and does not.  Patient has history of stroke previously.  Was seen in urgent care and sent here to the ED ?  ?Versie Starks, PA-C ?03/12/22 1750 ? ?

## 2022-03-12 NOTE — ED Notes (Signed)
MD at the bedside  
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
## 2022-03-12 NOTE — ED Triage Notes (Signed)
Pt via EMS from [facility]. Pt c/o weakness. Pt has a hx TIA a couple of years ago. Per EMS, pt was having trouble doing basic math and basic motor skill. - Stroke screen. LVO -. Pt is A&OX4 and NAD.  ? ?135 CBG  ?94 Hr  ?96%  ?148/76  ? ?

## 2022-03-12 NOTE — ED Provider Notes (Signed)
? ?[facility] ?Provider Note ? ? ? Event Date/Time  ? First MD Initiated Contact with Patient 03/12/22 1843   ?  (approximate) ? ? ?History  ? ?Weakness ? ? ?HPI ? ?Luis Strong is a 76 y.o. male with past medical history of hypertension, prior MI, CKD, prior stroke, here with transient confusion.  The patient was in his usual state of health earlier today.  He went out to eat with friends.  He had 1 peer.  This not unusual for him.  He states that when he was trying to check out, he experienced acute confusion.  He states he had a hard time remembering how to pay the bill.  This is very abnormal for him.  He then reportedly was trailing off when he was talking to his friend.  His friend noticed this, took him to his son, who presents for further evaluation.  Patient states he now feels back to his baseline.  Denies any focal numbness or weakness.  Denies any other complaints. ?  ? ? ?Physical Exam  ? ?Triage Vital Signs: ?ED Triage Vitals  ?Enc Vitals Group  ?   BP 03/12/22 1747 124/66  ?   Pulse Rate 03/12/22 1747 78  ?   Resp 03/12/22 1747 18  ?   Temp 03/12/22 1747 98.4 ?F (36.9 ?C)  ?   Temp Source 03/12/22 1747 Oral  ?   SpO2 03/12/22 1747 96 %  ?   Weight 03/12/22 1748 203 lb (92.1 kg)  ?   Height 03/12/22 1748 '5\' 9"'$  (1.753 m)  ?   Head Circumference --   ?   Peak Flow --   ?   Pain Score 03/12/22 1748 0  ?   Pain Loc --   ?   Pain Edu? --   ?   Excl. in [place]? --   ? ? ?Most recent vital signs: ?Vitals:  ? 03/12/22 2200 03/12/22 2230  ?BP: 113/64 116/62  ?Pulse: 77 74  ?Resp: 17 18  ?Temp:  97.6 ?F (36.4 ?C)  ?SpO2: 97% 96%  ? ? ? ?General: Awake, no distress.  ?CV:  Good peripheral perfusion.  ?Resp:  Normal effort.  Lungs clear to auscultation bilaterally. ?Abd:  No distention.  No tenderness. ?Other:  Cranial nerves 2 through 12 intact.  Very slight past-pointing on left finger-to-nose, otherwise normal cerebellar testing.  Strength 5 5 bilateral upper and lower EXTR.  Normal  sensation light touch. ? ? ?ED Results / Procedures / Treatments  ? ?Labs ?(all labs ordered are listed, but only abnormal results are displayed) ?Labs Reviewed  ?CBC - Abnormal; Notable for the following components:  ?    Result Value  ? Platelets 127 (*)   ? All other components within normal limits  ?COMPREHENSIVE METABOLIC PANEL - Abnormal; Notable for the following components:  ? Glucose, Bld 109 (*)   ? Creatinine, Ser 1.63 (*)   ? Alkaline Phosphatase 36 (*)   ? Total Bilirubin 1.3 (*)   ? GFR, Estimated 43 (*)   ? All other components within normal limits  ?URINALYSIS, ROUTINE W REFLEX MICROSCOPIC - Abnormal; Notable for the following components:  ? Color, Urine YELLOW (*)   ? APPearance CLEAR (*)   ? All other components within normal limits  ?PROTIME-INR  ?APTT  ?DIFFERENTIAL  ? ? ? ?EKG ?Normal sinus rhythm, ventricular rate 77.  PR 142, QRS 76, QTc 439.  Normal sinus rhythm, no acute ST elevations or  depressions. ? ? ?RADIOLOGY ?CT head: No acute intracranial abnormality ? ? ?I also independently reviewed and agree wit radiologist interpretations. ? ? ?PROCEDURES: ? ?Critical Care performed: No ? ?.1-3 Lead EKG Interpretation ?Performed by: Duffy Bruce, MD ?Authorized by: Duffy Bruce, MD  ? ?  Interpretation: normal   ?  ECG rate:  70-90 ?  ECG rate assessment: normal   ?  Rhythm: sinus rhythm   ?  Ectopy: none   ?  Conduction: normal   ?Comments:  ?   Indication: Weakness ? ? ? ?MEDICATIONS ORDERED IN ED: ?Medications  ?sodium chloride flush (NS) 0.9 % injection 3 mL (3 mLs Intravenous Given 03/12/22 1849)  ? ? ? ?IMPRESSION / MDM / ASSESSMENT AND PLAN / ED COURSE  ?I reviewed the triage vital signs and the nursing notes. ?             ?               ? ? ?The patient is on the cardiac monitor to evaluate for evidence of arrhythmia and/or significant heart rate changes. ? ? ?Ddx:  ?TIA, CVA, transient confusion in setting of dehydration + etOh use, orthostasis, polypharmacy ? ? ?MDM:  ?76 yo well  appearing male with PMHx GERD, HLD, TIA/CVA on ASA (no plavix 2/2 h/o GIB) here with transient confusion. Clinically, suspect this could have been 2/2 combination of drinking EtOH, relative dehydration. However, TIA/CVA a consideration given his history though neuro exam nonfocal. Broad work-up obtained, reviewed. CT head negative, MRI also obtained, reviewed by me and is negative. Labs reassuring - CBC with no leukocytosis or anemia. CMP is at baseline. Labs o/w unremarkable. ? ?Reviewed prior PCP notes with Dr .Ronnald Ramp, records. Pt sees Dr. Melrose Nakayama for his Neuro history. He is medically optimized for TIA at this time. Given resolution of sx, his current medical optimization, feel it's reasonable for him to f/u with Neuro as an outpt for this. Return precautions given. ? ? ?MEDICATIONS GIVEN IN ED: ?Medications  ?sodium chloride flush (NS) 0.9 % injection 3 mL (3 mLs Intravenous Given 03/12/22 1849)  ? ? ? ?Consults:  ?None ? ? ?EMR reviewed  ?PCP notes with Dr. Ronnald Ramp ? ? ? ? ?FINAL CLINICAL IMPRESSION(S) / ED DIAGNOSES  ? ?Final diagnoses:  ?Transient confusion  ? ? ? ?Rx / DC Orders  ? ?ED Discharge Orders   ? ? None  ? ?  ? ? ? ?Note:  This document was prepared using Dragon voice recognition software and may include unintentional dictation errors. ?  ?Duffy Bruce, MD ?03/13/22 1353 ? ?

## 2022-03-12 NOTE — ED Triage Notes (Signed)
Pt c/o confusion while at the restaurant trying to figure out the tip percentage. Pt was worried about a possible stroke. ? ?Pt was at the alehouse at 1pm. Pt took some tylenol. ? ?Pt c/o confusion and  headache along the front of face above eyes.  ? ?Pt had a stroke 3 years ago in February. ? ?Pt sees a neurologist for tingling along wrist and fingers.  ?

## 2022-03-12 NOTE — ED Triage Notes (Signed)
Pt to ED via [facility] from UC. Pt report weakness and was concerned he was having a TIA. Pt with hx TIA. Per EMS pt was having difficulty doing simple math. Negative stroke screen. Pt symptoms started at 12:30pm. Pt with slight left sided facial droop that is new per son. Pt reports numbness and tingling in left hand that is not new.  ? ? ?

## 2022-03-12 NOTE — Discharge Instructions (Signed)
As we discussed, I'd recommend following up with Dr. Melrose Nakayama this week to discuss your symptoms ?

## 2022-03-12 NOTE — ED Notes (Signed)
Patient is being discharged from the Urgent Care and sent to the Emergency Department via Emergency Vehicle . Per [place], [place], patient is in need of higher level of care due to Confusion and Left side facial droop. Patient is aware and verbalizes understanding of plan of care.  ?Vitals:  ? 03/12/22 1550  ?BP: (!) 159/96  ?Pulse: 95  ?Resp: 18  ?Temp: 98.2 ?F (36.8 ?C)  ?SpO2: 99%  ?  ?

## 2022-03-12 NOTE — Discharge Instructions (Addendum)

## 2022-03-12 NOTE — ED Notes (Signed)
Report from Angelyn Punt, RN ?

## 2022-03-14 DIAGNOSIS — G459 Transient cerebral ischemic attack, unspecified: Secondary | ICD-10-CM | POA: Diagnosis not present

## 2022-03-14 DIAGNOSIS — R531 Weakness: Secondary | ICD-10-CM | POA: Diagnosis not present

## 2022-03-14 DIAGNOSIS — R202 Paresthesia of skin: Secondary | ICD-10-CM | POA: Diagnosis not present

## 2022-03-14 DIAGNOSIS — Z8673 Personal history of transient ischemic attack (TIA), and cerebral infarction without residual deficits: Secondary | ICD-10-CM | POA: Diagnosis not present

## 2022-03-14 DIAGNOSIS — R200 Anesthesia of skin: Secondary | ICD-10-CM | POA: Diagnosis not present

## 2022-04-24 ENCOUNTER — Ambulatory Visit: Payer: Medicare Other | Admitting: Family Medicine

## 2022-04-24 DIAGNOSIS — Z85828 Personal history of other malignant neoplasm of skin: Secondary | ICD-10-CM | POA: Diagnosis not present

## 2022-04-24 DIAGNOSIS — L578 Other skin changes due to chronic exposure to nonionizing radiation: Secondary | ICD-10-CM | POA: Diagnosis not present

## 2022-04-24 DIAGNOSIS — L853 Xerosis cutis: Secondary | ICD-10-CM | POA: Diagnosis not present

## 2022-04-24 DIAGNOSIS — Z872 Personal history of diseases of the skin and subcutaneous tissue: Secondary | ICD-10-CM | POA: Diagnosis not present

## 2022-04-24 DIAGNOSIS — L570 Actinic keratosis: Secondary | ICD-10-CM | POA: Diagnosis not present

## 2022-04-25 DIAGNOSIS — Z8673 Personal history of transient ischemic attack (TIA), and cerebral infarction without residual deficits: Secondary | ICD-10-CM | POA: Diagnosis not present

## 2022-04-25 DIAGNOSIS — R531 Weakness: Secondary | ICD-10-CM | POA: Diagnosis not present

## 2022-04-25 DIAGNOSIS — R202 Paresthesia of skin: Secondary | ICD-10-CM | POA: Diagnosis not present

## 2022-04-25 DIAGNOSIS — R200 Anesthesia of skin: Secondary | ICD-10-CM | POA: Diagnosis not present

## 2022-05-14 ENCOUNTER — Ambulatory Visit (INDEPENDENT_AMBULATORY_CARE_PROVIDER_SITE_OTHER): Payer: Medicare Other | Admitting: Internal Medicine

## 2022-05-14 VITALS — BP 108/62 | HR 72 | Resp 16 | Ht 69.0 in | Wt 206.0 lb

## 2022-05-14 DIAGNOSIS — Z7189 Other specified counseling: Secondary | ICD-10-CM

## 2022-05-14 DIAGNOSIS — G4733 Obstructive sleep apnea (adult) (pediatric): Secondary | ICD-10-CM | POA: Diagnosis not present

## 2022-05-14 DIAGNOSIS — Z9989 Dependence on other enabling machines and devices: Secondary | ICD-10-CM

## 2022-05-14 DIAGNOSIS — I10 Essential (primary) hypertension: Secondary | ICD-10-CM
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
## 2022-05-17 DIAGNOSIS — G4733 Obstructive sleep apnea (adult) (pediatric): Secondary | ICD-10-CM | POA: Diagnosis not present

## 2022-05-28 ENCOUNTER — Other Ambulatory Visit: Payer: Self-pay | Admitting: Family Medicine

## 2022-05-28 DIAGNOSIS — I10 Essential (primary) hypertension: Secondary | ICD-10-CM

## 2022-05-29 IMAGING — CT CT ANGIO HEAD-NECK (W OR W/O PERF)
3 of 7 series · 10 of 36 positions shown · IV contrast (APPLIED)
Comparison: Noncontrast head CT performed earlier today 01/01/2022.

CLINICAL DATA: Provided history: Neuro deficit, acute, stroke
suspected. Additional history provided: Altered mental status.

EXAM:
CT ANGIOGRAPHY HEAD AND NECK
TECHNIQUE: Multidetector CT imaging of the head and neck was performed using
the standard protocol during bolus administration of intravenous
contrast. Multiplanar CT image reconstructions and MIPs were
obtained to evaluate the vascular anatomy. Carotid stenosis
measurements (when applicable) are obtained utilizing NASCET
criteria, using the distal internal carotid diameter as the
denominator.

[Series 4: cta head neck · axial · 0.54mm/px · z∈[-220,-102]mm · 2 of 177 slices shown]
[im 59/177  soft-tissue]
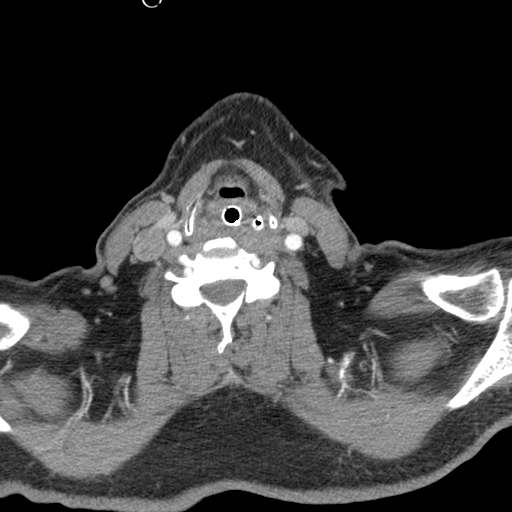
[im 118/177  soft-tissue]
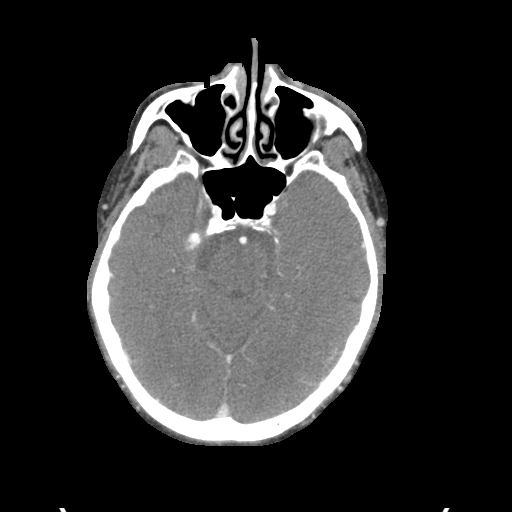

[Series 6: ax thin · axial · 0.52mm/px · z∈[-278,-14]mm · 6 of 373 slices shown]
[im 54/373  soft-tissue]
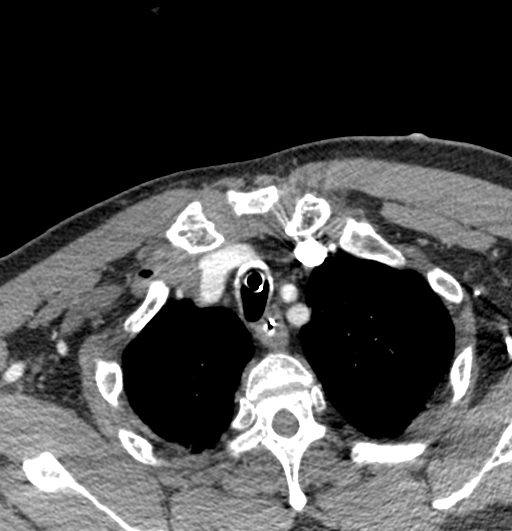
[im 107/373  bone]
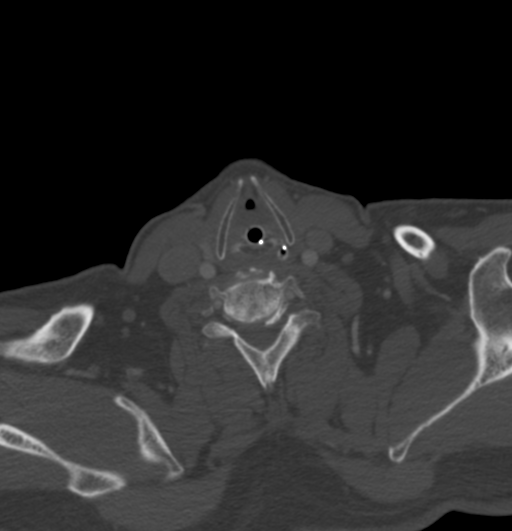
[im 160/373  soft-tissue]
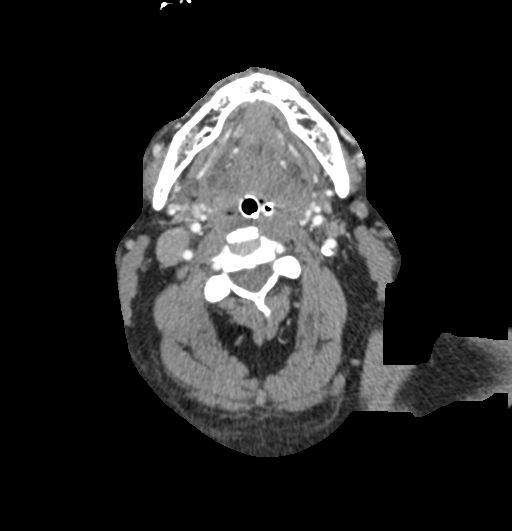
[im 213/373  bone]
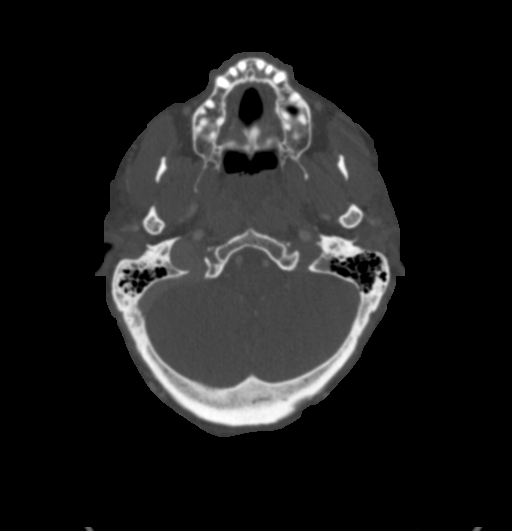
[im 266/373  soft-tissue]
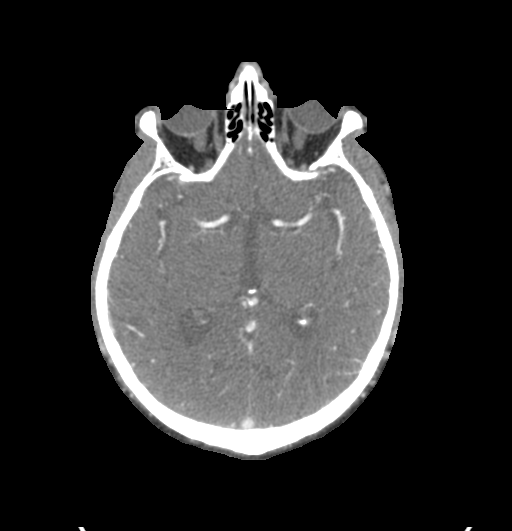
[im 319/373  bone]
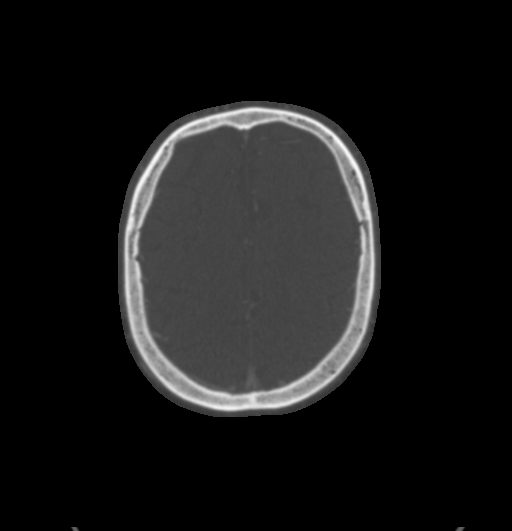

[Series 8: sagittal thin · sagittal · 0.50mm/px · 2 of 235 slices shown]
[im 46/235  soft-tissue]
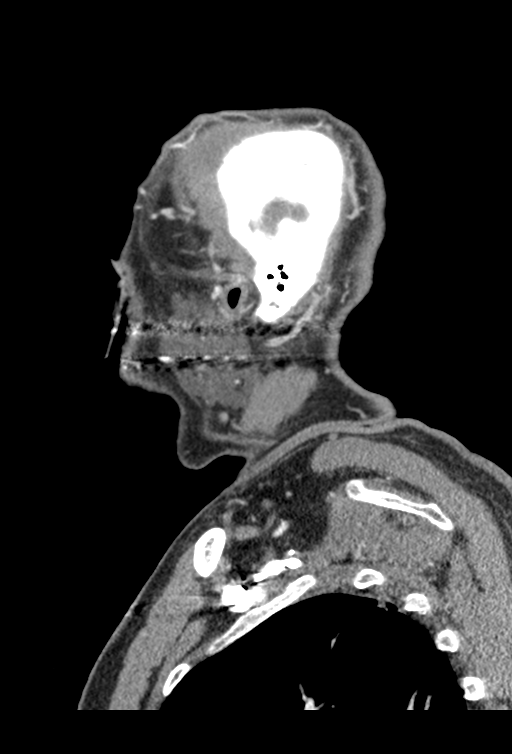
[im 190/235  soft-tissue]
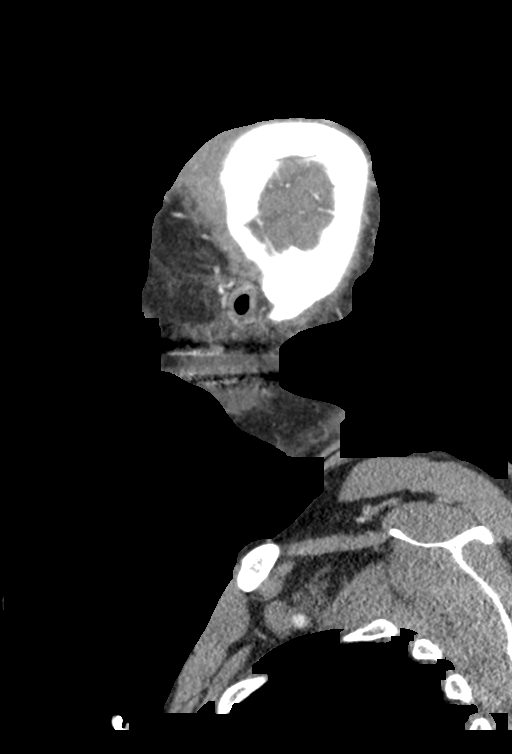

[10 of 36 positions shown; findings below may reference images not displayed]

RADIATION DOSE REDUCTION: This exam was performed according to the
departmental dose-optimization program which includes automated
exposure control, adjustment of the mA and/or kV according to
patient size and/or use of iterative reconstruction technique.

CONTRAST:  75mL OMNIPAQUE IOHEXOL 350 MG/ML SOLN
FINDINGS: CTA NECK FINDINGS

Aortic arch: Standard aortic branching. The visualized aortic arch
is normal in caliber. Streak and beam hardening artifact arising
from a dense left-sided contrast bolus partially obscures the left
subclavian artery. Within this limitation, there is no appreciable
hemodynamically significant stenosis within the innominate or
proximal subclavian arteries.

Right carotid system: CCA and ICA patent within the neck without
significant stenosis (50% or greater). Mild atherosclerotic plaque
about the carotid bifurcation and within the proximal ICA.

Left carotid system: CCA and ICA patent within the neck. Mild to
moderate atherosclerotic plaque about the carotid bifurcation and
within the proximal ICA. Resultant less than 50% stenosis within the
proximal ICA.

Vertebral arteries: Vertebral arteries codominant and patent within
the neck without stenosis.

Skeleton: Cervical spondylosis. Partially imaged thoracic
levocurvature. No acute bony abnormality or aggressive osseous
lesion.

Other neck: No neck mass or cervical lymphadenopathy.

Upper chest: No consolidation within the imaged lung apices. Mild
linear atelectasis versus scarring within the imaged lungs. The ET
tube terminates within the thoracic trachea, above the level of the
carina. Partially visualized enteric tube.

Review of the MIP images confirms the above findings

CTA HEAD FINDINGS

Anterior circulation:

The intracranial internal carotid arteries are patent. Non-stenotic
calcified plaque within both vessels. The M1 middle cerebral
arteries are patent. No M2 proximal branch occlusion or high-grade
proximal stenosis is identified. The anterior cerebral arteries are
patent. No intracranial aneurysm is identified. No intracranial
aneurysm is identified.

Posterior circulation:

The intracranial vertebral arteries are patent. The basilar artery
is patent. The posterior cerebral arteries are patent. Posterior
communicating arteries are diminutive or absent, bilaterally

Venous sinuses: Within the limitations of contrast timing, no
convincing thrombus.

Anatomic variants: As described

Review of the MIP images confirms the above findings

No emergent large vessel occlusion identified. These results were
communicated to Dr. Kato At [DATE] pmon 01/01/2022by text page via
the AMION messaging system.
IMPRESSION: CTA neck:

The common carotid, internal carotid and vertebral arteries are
patent within the neck without hemodynamically significant stenosis.
Atherosclerotic plaque about the carotid bifurcations and within the
proximal ICAs, left greater than right.

CTA head:

1. No intracranial large vessel occlusion or proximal high-grade
arterial stenosis identified.
2. Non-stenotic calcified plaque within the intracranial ICAs,
bilaterally.

## 2022-05-30 IMAGING — DX DG CHEST 1V PORT
1 series · 1 of 1 positions shown · non-contrast
Comparison: Portable chest 01/01/2022 and earlier.

CLINICAL DATA: 75-year-old male with respiratory failure.
Intubated.

EXAM:
PORTABLE CHEST 1 VIEW

[chest ap]
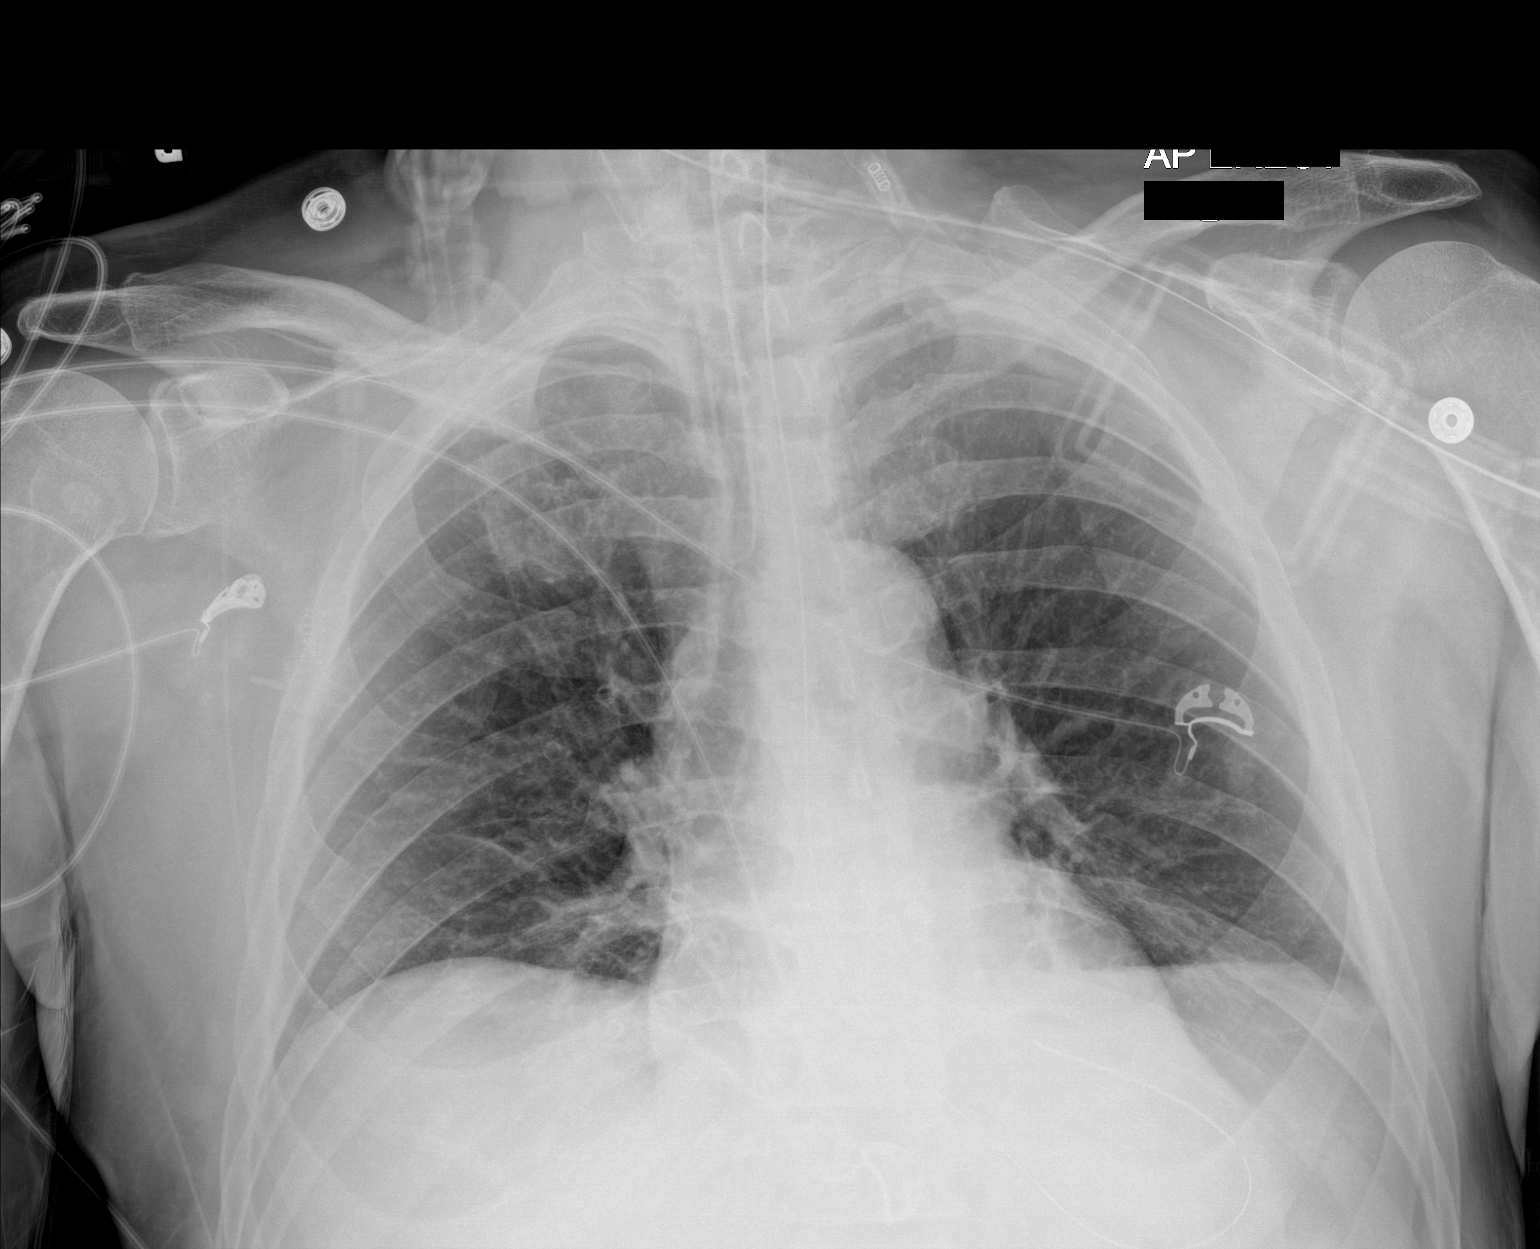

[1 of 1 positions shown; findings below may reference images not displayed]

FINDINGS: Portable AP upright view at 4299 hours. Endotracheal tube tip in
good position between the clavicles and carina. Enteric tube looped
in the stomach. Mildly lower lung volumes. Mediastinal contours
remain normal. Allowing for portable technique the lungs are clear.
No pneumothorax or pleural effusion. No acute osseous abnormality
identified.
IMPRESSION: 1.  Stable lines and tubes.
2. No acute cardiopulmonary abnormality.

## 2022-06-28 ENCOUNTER — Encounter: Payer: Self-pay | Admitting: Family Medicine

## 2022-06-28 ENCOUNTER — Ambulatory Visit (INDEPENDENT_AMBULATORY_CARE_PROVIDER_SITE_OTHER): Payer: Medicare Other | Admitting: Family Medicine

## 2022-06-28 VITALS — BP 100/64 | HR 68 | Ht 69.0 in | Wt 204.0 lb

## 2022-06-28 DIAGNOSIS — K219 Gastro-esophageal reflux disease without esophagitis: Secondary | ICD-10-CM | POA: Diagnosis not present

## 2022-06-28 DIAGNOSIS — F4323 Adjustment disorder with mixed anxiety and depressed mood: Secondary | ICD-10-CM | POA: Diagnosis not present

## 2022-06-28 DIAGNOSIS — R197 Diarrhea, unspecified: Secondary | ICD-10-CM

## 2022-06-28 DIAGNOSIS — E782 Mixed hyperlipidemia: Secondary | ICD-10-CM | POA: Diagnosis not present

## 2022-06-28 DIAGNOSIS — I10 Essential (primary) hypertension: Secondary | ICD-10-CM

## 2022-06-28 MED ORDER — PANTOPRAZOLE SODIUM 40 MG PO TBEC: DELAYED_RELEASE_TABLET | ORAL | 1 refills | Status: DC

## 2022-06-28 MED ORDER — ROSUVASTATIN CALCIUM 20 MG PO TABS: 20.0000 mg | ORAL_TABLET | Freq: Every day | ORAL | 1 refills | Status: DC

## 2022-06-28 MED ORDER — CITALOPRAM HYDROBROMIDE 40 MG PO TABS: 40.0000 mg | ORAL_TABLET | Freq: Every day | ORAL | 1 refills | Status: DC

## 2022-06-28 MED ORDER — METOPROLOL SUCCINATE ER 25 MG PO TB24: 25.0000 mg | ORAL_TABLET | Freq: Every day | ORAL | 1 refills | Status: DC
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
## 2022-07-02 DIAGNOSIS — I10 Essential (primary) hypertension: Secondary | ICD-10-CM | POA: Diagnosis not present

## 2022-07-02 DIAGNOSIS — N1832 Chronic kidney disease, stage 3b: Secondary | ICD-10-CM | POA: Diagnosis not present

## 2022-07-27 ENCOUNTER — Encounter: Payer: Self-pay | Admitting: Urology

## 2022-07-27 ENCOUNTER — Ambulatory Visit (INDEPENDENT_AMBULATORY_CARE_PROVIDER_SITE_OTHER): Payer: Medicare Other | Admitting: Urology

## 2022-07-27 ENCOUNTER — Ambulatory Visit
Admission: RE | Admit: 2022-07-27 | Discharge: 2022-07-27 | Disposition: A | Discharge status: Home / Self Care | Payer: Medicare Other | Source: Ambulatory Visit | Attending: Urology | Admitting: Urology

## 2022-07-27 VITALS — BP 120/67 | HR 62 | Ht 69.0 in | Wt 203.0 lb

## 2022-07-27 DIAGNOSIS — N200 Calculus of kidney: Secondary | ICD-10-CM | POA: Insufficient documentation
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
## 2022-07-29 ENCOUNTER — Encounter: Payer: Self-pay | Admitting: Urology

## 2022-08-20 ENCOUNTER — Ambulatory Visit (INDEPENDENT_AMBULATORY_CARE_PROVIDER_SITE_OTHER): Payer: Medicare Other | Admitting: Internal Medicine

## 2022-08-20 VITALS — BP 114/67 | HR 86 | Resp 18 | Ht 69.0 in | Wt 203.0 lb

## 2022-08-20 DIAGNOSIS — I10 Essential (primary) hypertension: Secondary | ICD-10-CM | POA: Diagnosis not present

## 2022-08-20 DIAGNOSIS — G4733 Obstructive sleep apnea (adult) (pediatric): Secondary | ICD-10-CM

## 2022-08-20 DIAGNOSIS — Z7189 Other specified counseling: Secondary | ICD-10-CM | POA: Diagnosis not present

## 2022-08-20 NOTE — Patient Instructions (Signed)
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
## 2022-08-27 ENCOUNTER — Other Ambulatory Visit: Payer: Self-pay | Admitting: Family Medicine

## 2022-08-27 DIAGNOSIS — I10 Essential (primary) hypertension: Secondary | ICD-10-CM

## 2022-08-28 DIAGNOSIS — R531 Weakness: Secondary | ICD-10-CM | POA: Diagnosis not present

## 2022-08-28 DIAGNOSIS — G459 Transient cerebral ischemic attack, unspecified: Secondary | ICD-10-CM | POA: Diagnosis not present

## 2022-08-28 DIAGNOSIS — R200 Anesthesia of skin: Secondary | ICD-10-CM | POA: Diagnosis not present

## 2022-08-28 DIAGNOSIS — Z8673 Personal history of transient ischemic attack (TIA), and cerebral infarction without residual deficits: Secondary | ICD-10-CM | POA: Diagnosis not present

## 2022-08-28 DIAGNOSIS — R202 Paresthesia of skin: Secondary | ICD-10-CM | POA: Diagnosis not present

## 2022-08-28 NOTE — Telephone Encounter (Signed)
Requested Prescriptions  Pending Prescriptions Disp Refills  . metoprolol succinate (TOPROL-XL) 25 MG 24 hr tablet [Pharmacy Med Name: METOPROLOL SUCCINATE ER TABS '25MG'$ ] 90 tablet 3    Sig: TAKE 1 TABLET AT BEDTIME     Cardiovascular:  Beta Blockers Passed - 08/27/2022  3:42 AM      Passed - Last BP in normal range    BP Readings from Last 1 Encounters:  08/20/22 114/67         Passed - Last Heart Rate in normal range    Pulse Readings from Last 1 Encounters:  08/20/22 86         Passed - Valid encounter within last 6 months    Recent Outpatient Visits          2 months ago Essential hypertension   [facility] [place], Elfr[place], MD   6 months ago Adjustment disorder with mixed anxiety and depressed mood   [place] [facility] [place], E. Lopez, MD   8 months ago Hospital discharge follow-up   [facility] at [place], Deanna C, MD   1 year ago Essential hypertension   [place] [facility] [place], Deanna C, MD   1 year ago Essential hypertension   [place] [facility] [place], Mildred, MD      Future Appointments            In 4 months Juline Patch, MD [facility] at [facility], [place]   In 5 months [place], Ronda Fairly, [place]

## 2022-08-28 NOTE — Telephone Encounter (Signed)
Called and spoke with pt. Pt does not need this medication refilled at this time.

## 2022-10-25 DIAGNOSIS — N1832 Chronic kidney disease, stage 3b: Secondary | ICD-10-CM | POA: Diagnosis not present

## 2022-10-25 DIAGNOSIS — I10 Essential (primary) hypertension: Secondary | ICD-10-CM | POA: Diagnosis not present

## 2022-11-09 ENCOUNTER — Ambulatory Visit: Payer: Medicare Other | Admitting: Urology

## 2022-12-17 ENCOUNTER — Other Ambulatory Visit: Payer: Self-pay | Admitting: Family Medicine

## 2022-12-17 DIAGNOSIS — E782 Mixed hyperlipidemia: Secondary | ICD-10-CM

## 2022-12-17 NOTE — Telephone Encounter (Signed)
Requested medication (s) are due for refill today: yes  Requested medication (s) are on the active medication list: yes  Last refill:  06/28/22 #90/1  Future visit scheduled: yes  Notes to clinic:  Unable to refill per protocol due to failed labs, no updated results.    Requested Prescriptions  Pending Prescriptions Disp Refills   rosuvastatin (CRESTOR) 20 MG tablet [Pharmacy Med Name: ROSUVASTATIN TABS '20MG'$ ] 90 tablet 3    Sig: TAKE 1 TABLET AT BEDTIME     Cardiovascular:  Antilipid - Statins 2 Failed - 12/17/2022  2:27 AM      Failed - Cr in normal range and within 360 days    Creatinine  Date Value Ref Range Status  11/11/2014 1.61 (H) 0.60 - 1.30 mg/dL Final   Creatinine, Ser  Date Value Ref Range Status  03/12/2022 1.63 (H) 0.61 - 1.24 mg/dL Final         Failed - Lipid Panel in normal range within the last 12 months    Cholesterol, Total  Date Value Ref Range Status  07/17/2021 139 100 - 199 mg/dL Final   LDL Chol Calc (NIH)  Date Value Ref Range Status  07/17/2021 85 0 - 99 mg/dL Final   HDL  Date Value Ref Range Status  07/17/2021 37 (L) >39 mg/dL Final   Triglycerides  Date Value Ref Range Status  07/17/2021 91 0 - 149 mg/dL Final         Passed - Patient is not pregnant      Passed - Valid encounter within last 12 months    Recent Outpatient Visits           5 months ago Essential hypertension   [place] [facility] [place], [place], MD   9 months ago Adjustment disorder with mixed anxiety and depressed mood   [place] [facility] [place], [place], MD   11 months ago Hospital discharge follow-up   [facility] at [place], Deanna C, MD   1 year ago Essential hypertension   [place] [facility] at [place], Deanna C, MD   1 year ago Essential hypertension   [place] [facility] at [place], [place], MD       Future Appointments             In 2 weeks Juline Patch, MD [facility] at [facility], [place]   In 1 month [place], Ronda Fairly, MD [place]

## 2022-12-26 ENCOUNTER — Ambulatory Visit (INDEPENDENT_AMBULATORY_CARE_PROVIDER_SITE_OTHER): Payer: Medicare Other

## 2022-12-26 VITALS — BP 124/76 | Ht 69.0 in | Wt 204.4 lb

## 2022-12-26 DIAGNOSIS — Z0000 Encounter for general adult medical examination without abnormal findings: Secondary | ICD-10-CM

## 2022-12-26 DIAGNOSIS — Z1211 Encounter for screening for malignant neoplasm of colon: Secondary | ICD-10-CM

## 2022-12-26 NOTE — Patient Instructions (Signed)
Mr. Luis Strong , Thank you for taking time to come for your Medicare Wellness Visit. I appreciate your ongoing commitment to your health goals. Please review the following plan we discussed and let me know if I can assist you in the future.   These are the goals we discussed:  Goals      DIET - EAT MORE FRUITS AND VEGETABLES     Weight (lb) < 200 lb (90.7 kg)     Patient would like to lose weight over the next year with healthy eating and physical activity.         This is a list of the screening recommended for you and due dates:  Health Maintenance  Topic Date Due   DTaP/Tdap/Td vaccine (1 - Tdap) Never done   Flu Shot  06/19/2022   COVID-19 Vaccine (6 - 2023-24 season) 07/20/2022   Colon Cancer Screening  12/05/2022   Medicare Annual Wellness Visit  12/27/2023   Pneumonia Vaccine  Completed   Hepatitis C Screening: USPSTF Recommendation to screen - Ages 18-79 yo.  Completed   Zoster (Shingles) Vaccine  Completed   HPV Vaccine  Aged Out    Advanced directives: no  Conditions/risks identified: none  Next appointment: Follow up in one year for your annual wellness visit 01/01/24 @ 2:00 pm in person   Preventive Care 65 Years and Older, Male Preventive care refers to lifestyle choices and visits with your health care provider that can promote health and wellness. What does preventive care include? A yearly physical exam. This is also called an annual well check. Dental exams once or twice a year. Routine eye exams. Ask your health care provider how often you should have your eyes checked. Personal lifestyle choices, including: Daily care of your teeth and gums. Regular physical activity. Eating a healthy diet. Avoiding tobacco and drug use. Limiting alcohol use. Practicing safe sex. Taking low-dose aspirin every day. Taking vitamin and mineral supplements as recommended by your health care provider. What happens during an annual well check? The services and screenings  done by your health care provider during your annual well check will depend on your age, overall health, lifestyle risk factors, and family history of disease. Counseling  Your health care provider may ask you questions about your: Alcohol use. Tobacco use. Drug use. Emotional well-being. Home and relationship well-being. Sexual activity. Eating habits. History of falls. Memory and ability to understand (cognition). Work and work Statistician. Reproductive health. Screening  You may have the following tests or measurements: Height, weight, and BMI. Blood pressure. Lipid and cholesterol levels. These may be checked every 5 years, or more frequently if you are over 20 years old. Skin check. Lung cancer screening. You may have this screening every year starting at age 107 if you have a 30-pack-year history of smoking and currently smoke or have quit within the past 15 years. Fecal occult blood test (FOBT) of the stool. You may have this test every year starting at age 31. Flexible sigmoidoscopy or colonoscopy. You may have a sigmoidoscopy every 5 years or a colonoscopy every 10 years starting at age 80. Hepatitis C blood test. Hepatitis B blood test. Sexually transmitted disease (STD) testing. Diabetes screening. This is done by checking your blood sugar (glucose) after you have not eaten for a while (fasting). You may have this done every 1-3 years. Bone density scan. This is done to screen for osteoporosis. You may have this done starting at age 15. Mammogram. This may be done  every 1-2 years. Talk to your health care provider about how often you should have regular mammograms. Talk with your health care provider about your test results, treatment options, and if necessary, the need for more tests. Vaccines  Your health care provider may recommend certain vaccines, such as: Influenza vaccine. This is recommended every year. Tetanus, diphtheria, and acellular pertussis (Tdap, Td)  vaccine. You may need a Td booster every 10 years. Zoster vaccine. You may need this after age 100. Pneumococcal 13-valent conjugate (PCV13) vaccine. One dose is recommended after age 39. Pneumococcal polysaccharide (PPSV23) vaccine. One dose is recommended after age 82. Talk to your health care provider about which screenings and vaccines you need and how often you need them. This information is not intended to replace advice given to you by your health care provider. Make sure you discuss any questions you have with your health care provider. Document Released: 12/02/2015 Document Revised: 07/25/2016 Document Reviewed: 09/06/2015 Elsevier Interactive Patient Education  2017 [place] Prevention in the Home Falls can cause injuries. They can happen to people of all ages. There are many things you can do to make your home safe and to help prevent falls. What can I do on the outside of my home? Regularly fix the edges of walkways and driveways and fix any cracks. Remove anything that might make you trip as you walk through a door, such as a raised step or threshold. Trim any bushes or trees on the path to your home. Use bright outdoor lighting. Clear any walking paths of anything that might make someone trip, such as rocks or tools. Regularly check to see if handrails are loose or broken. Make sure that both sides of any steps have handrails. Any raised decks and porches should have guardrails on the edges. Have any leaves, snow, or ice cleared regularly. Use sand or salt on walking paths during winter. Clean up any spills in your garage right away. This includes oil or grease spills. What can I do in the bathroom? Use night lights. Install grab bars by the toilet and in the tub and shower. Do not use towel bars as grab bars. Use non-skid mats or decals in the tub or shower. If you need to sit down in the shower, use a plastic, non-slip stool. Keep the floor dry. Clean up any  water that spills on the floor as soon as it happens. Remove soap buildup in the tub or shower regularly. Attach bath mats securely with double-sided non-slip rug tape. Do not have throw rugs and other things on the floor that can make you trip. What can I do in the bedroom? Use night lights. Make sure that you have a light by your bed that is easy to reach. Do not use any sheets or blankets that are too big for your bed. They should not hang down onto the floor. Have a firm chair that has side arms. You can use this for support while you get dressed. Do not have throw rugs and other things on the floor that can make you trip. What can I do in the kitchen? Clean up any spills right away. Avoid walking on wet floors. Keep items that you use a lot in easy-to-reach places. If you need to reach something above you, use a strong step stool that has a grab bar. Keep electrical cords out of the way. Do not use floor polish or wax that makes floors slippery. If you must use wax, use  non-skid floor wax. Do not have throw rugs and other things on the floor that can make you trip. What can I do with my stairs? Do not leave any items on the stairs. Make sure that there are handrails on both sides of the stairs and use them. Fix handrails that are broken or loose. Make sure that handrails are as long as the stairways. Check any carpeting to make sure that it is firmly attached to the stairs. Fix any carpet that is loose or worn. Avoid having throw rugs at the top or bottom of the stairs. If you do have throw rugs, attach them to the floor with carpet tape. Make sure that you have a light switch at the top of the stairs and the bottom of the stairs. If you do not have them, ask someone to add them for you. What else can I do to help prevent falls? Wear shoes that: Do not have high heels. Have rubber bottoms. Are comfortable and fit you well. Are closed at the toe. Do not wear sandals. If you use a  stepladder: Make sure that it is fully opened. Do not climb a closed stepladder. Make sure that both sides of the stepladder are locked into place. Ask someone to hold it for you, if possible. Clearly mark and make sure that you can see: Any grab bars or handrails. First and last steps. Where the edge of each step is. Use tools that help you move around (mobility aids) if they are needed. These include: Canes. Walkers. Scooters. Crutches. Turn on the lights when you go into a dark area. Replace any light bulbs as soon as they burn out. Set up your furniture so you have a clear path. Avoid moving your furniture around. If any of your floors are uneven, fix them. If there are any pets around you, be aware of where they are. Review your medicines with your doctor. Some medicines can make you feel dizzy. This can increase your chance of falling. Ask your doctor what other things that you can do to help prevent falls. This information is not intended to replace advice given to you by your health care provider. Make sure you discuss any questions you have with your health care provider. Document Released: 09/01/2009 Document Revised: 04/12/2016 Document Reviewed: 12/10/2014 Elsevier Interactive Patient Education  2017 [employer].
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
## 2022-12-31 ENCOUNTER — Ambulatory Visit (INDEPENDENT_AMBULATORY_CARE_PROVIDER_SITE_OTHER): Payer: Medicare Other | Admitting: Family Medicine

## 2022-12-31 ENCOUNTER — Encounter: Payer: Self-pay | Admitting: Family Medicine

## 2022-12-31 DIAGNOSIS — E782 Mixed hyperlipidemia: Secondary | ICD-10-CM | POA: Diagnosis not present

## 2022-12-31 DIAGNOSIS — F4323 Adjustment disorder with mixed anxiety and depressed mood: Secondary | ICD-10-CM

## 2022-12-31 DIAGNOSIS — I10 Essential (primary) hypertension: Secondary | ICD-10-CM | POA: Diagnosis not present

## 2022-12-31 MED ORDER — CITALOPRAM HYDROBROMIDE 20 MG PO TABS: 20.0000 mg | ORAL_TABLET | Freq: Every day | ORAL | 1 refills | Status: DC

## 2022-12-31 MED ORDER — METOPROLOL SUCCINATE ER 25 MG PO TB24: 25.0000 mg | ORAL_TABLET | Freq: Every day | ORAL | 1 refills | Status: DC

## 2022-12-31 MED ORDER — ROSUVASTATIN CALCIUM 20 MG PO TABS: 20.0000 mg | ORAL_TABLET | Freq: Every day | ORAL | 1 refills | Status: DC
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
## 2023-01-01 ENCOUNTER — Other Ambulatory Visit: Payer: Self-pay | Admitting: Family Medicine

## 2023-01-01 DIAGNOSIS — E782 Mixed hyperlipidemia: Secondary | ICD-10-CM

## 2023-01-01 LAB — LIPID PANEL WITH LDL/HDL RATIO
Cholesterol, Total: 160 mg/dL (ref 100–199)
HDL: 38 mg/dL — ABNORMAL LOW (ref >39)
LDL Chol Calc (NIH): 79 mg/dL (ref 0–99)
LDL/HDL Ratio: 2.1 ratio (ref 0.0–3.6)
Triglycerides: 262 mg/dL — ABNORMAL HIGH (ref 0–149)
VLDL Cholesterol Cal: 43 mg/dL — ABNORMAL HIGH (ref 5–40)

## 2023-01-01 LAB — COMPREHENSIVE METABOLIC PANEL
ALT: 22 IU/L (ref 0–44)
AST: 27 IU/L (ref 0–40)
Albumin/Globulin Ratio: 2.4 — ABNORMAL HIGH (ref 1.2–2.2)
Albumin: 4.7 g/dL (ref 3.8–4.8)
Alkaline Phosphatase: 58 IU/L (ref 44–121)
BUN/Creatinine Ratio: 10 (ref 10–24)
BUN: 17 mg/dL (ref 8–27)
Bilirubin Total: 0.9 mg/dL (ref 0.0–1.2)
CO2: 18 mmol/L — ABNORMAL LOW (ref 20–29)
Calcium: 9.7 mg/dL (ref 8.6–10.2)
Chloride: 105 mmol/L (ref 96–106)
Creatinine, Ser: 1.74 mg/dL — ABNORMAL HIGH (ref 0.76–1.27)
Globulin, Total: 2 g/dL (ref 1.5–4.5)
Glucose: 88 mg/dL (ref 70–99)
Potassium: 4.9 mmol/L (ref 3.5–5.2)
Sodium: 141 mmol/L (ref 134–144)
Total Protein: 6.7 g/dL (ref 6.0–8.5)
eGFR: 40 mL/min/{1.73_m2} — ABNORMAL LOW (ref >59)

## 2023-01-02 NOTE — Telephone Encounter (Signed)
Unable to refill per protocol, Rx request is too soon. Last refill 12/31/22 for 90 days and 3 refills.  Requested Prescriptions  Pending Prescriptions Disp Refills   rosuvastatin (CRESTOR) 20 MG tablet [Pharmacy Med Name: ROSUVASTATIN TABS 20MG] 90 tablet 3    Sig: TAKE 1 TABLET AT BEDTIME     Cardiovascular:  Antilipid - Statins 2 Failed - 01/01/2023  7:29 PM      Failed - Cr in normal range and within 360 days    Creatinine  Date Value Ref Range Status  11/11/2014 1.61 (H) 0.60 - 1.30 mg/dL Final   Creatinine, Ser  Date Value Ref Range Status  12/31/2022 1.74 (H) 0.76 - 1.27 mg/dL Final         Failed - Lipid Panel in normal range within the last 12 months    Cholesterol, Total  Date Value Ref Range Status  12/31/2022 160 100 - 199 mg/dL Final   LDL Chol Calc (NIH)  Date Value Ref Range Status  12/31/2022 79 0 - 99 mg/dL Final   HDL  Date Value Ref Range Status  12/31/2022 38 (L) >39 mg/dL Final   Triglycerides  Date Value Ref Range Status  12/31/2022 262 (H) 0 - 149 mg/dL Final         Passed - Patient is not pregnant      Passed - Valid encounter within last 12 months    Recent Outpatient Visits           2 days ago Adjustment disorder with mixed anxiety and depressed mood   [facility] [place], [place], MD   6 months ago Essential hypertension   [place] [facility] [place], [place], MD   10 months ago Adjustment disorder with mixed anxiety and depressed mood   [facility] [place], Deanna C, MD   1 year ago Hospital discharge follow-up   [place] [facility] [place], Deanna C, MD   1 year ago Essential hypertension   [place] [facility] [place], [place], MD       Future Appointments             In 4 weeks Stoioff, Ronda Fairly, MD [place]   In 2 months Jonathon Bellows, MD [facility] [place]   In 6 months Juline Patch, MD [place] [facility], [facility]

## 2023-01-30 ENCOUNTER — Encounter: Payer: Self-pay | Admitting: Urology

## 2023-01-30 ENCOUNTER — Ambulatory Visit
Admission: RE | Admit: 2023-01-30 | Discharge: 2023-01-30 | Disposition: A | Discharge status: Home / Self Care | Payer: Medicare Other | Attending: Urology | Admitting: Urology

## 2023-01-30 ENCOUNTER — Ambulatory Visit
Admission: RE | Admit: 2023-01-30 | Discharge: 2023-01-30 | Disposition: A | Discharge status: Home / Self Care | Payer: Medicare Other | Source: Ambulatory Visit | Attending: Urology | Admitting: Urology

## 2023-01-30 ENCOUNTER — Ambulatory Visit (INDEPENDENT_AMBULATORY_CARE_PROVIDER_SITE_OTHER): Payer: Medicare Other | Admitting: Urology

## 2023-01-30 VITALS — BP 118/70 | HR 67 | Ht 69.0 in | Wt 205.0 lb

## 2023-01-30 DIAGNOSIS — N200 Calculus of kidney: Secondary | ICD-10-CM
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
## 2023-02-26 DIAGNOSIS — N1832 Chronic kidney disease, stage 3b: Secondary | ICD-10-CM | POA: Diagnosis not present

## 2023-02-26 DIAGNOSIS — I10 Essential (primary) hypertension: Secondary | ICD-10-CM | POA: Diagnosis not present

## 2023-03-21 ENCOUNTER — Encounter: Payer: Self-pay | Admitting: Gastroenterology

## 2023-03-21 ENCOUNTER — Ambulatory Visit (INDEPENDENT_AMBULATORY_CARE_PROVIDER_SITE_OTHER): Payer: Medicare Other | Admitting: Gastroenterology

## 2023-03-21 ENCOUNTER — Other Ambulatory Visit: Payer: Self-pay | Admitting: *Deleted

## 2023-03-21 VITALS — BP 110/66 | HR 73 | Temp 97.8°F | Ht 69.0 in | Wt 208.0 lb

## 2023-03-21 DIAGNOSIS — Z86010 Personal history of colonic polyps: Secondary | ICD-10-CM | POA: Diagnosis not present

## 2023-03-21 MED ORDER — PEG 3350-KCL-NABCB-NACL-NASULF 236 G PO SOLR: 4000.0000 mL | Freq: Once | ORAL | 0 refills | Status: AC
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
## 2023-04-01 ENCOUNTER — Encounter: Payer: Self-pay | Admitting: *Deleted

## 2023-04-01 NOTE — Telephone Encounter (Signed)
error 

## 2023-04-02 ENCOUNTER — Encounter: Payer: Self-pay | Admitting: Gastroenterology

## 2023-04-02 NOTE — Anesthesia Preprocedure Evaluation (Addendum)
Anesthesia Evaluation  Patient identified by MRN, date of birth, ID band Patient awake    Reviewed: Allergy & Precautions, H&P , NPO status , Patient's Chart, lab work & pertinent test results  Airway Mallampati: III  TM Distance: >3 FB Neck ROM: Full    Dental no notable dental hx.    Pulmonary sleep apnea and Continuous Positive Airway Pressure Ventilation    Pulmonary exam normal breath sounds clear to auscultation       Cardiovascular hypertension, + CAD  Normal cardiovascular exam Rhythm:Regular Rate:Normal  01-02-22 EF 60-65%   Neuro/Psych  PSYCHIATRIC DISORDERS  Depression    CVA    GI/Hepatic Neg liver ROS, PUD,GERD  ,,  Endo/Other  negative endocrine ROS    Renal/GU Renal disease  negative genitourinary   Musculoskeletal negative musculoskeletal ROS (+)    Abdominal   Peds negative pediatric ROS (+)  Hematology negative hematology ROS (+)   Anesthesia Other Findings Hyperlipidemia  Hypertension Depression  Arteriosclerosis of coronary artery Benign essential HTN MI (mitral incompetence) Abnormal cardiovascular stress test Chest pain Combined fat and carbohydrate induced hyperlipemia  Kidney stones Cancer (HCC)  GERD (gastroesophageal reflux disease) Complication of anesthesia  Stroke (HCC) Sleep apnea  History of kidney stones CKD (chronic kidney disease), stage III (HCC)  No sequalae from CVA   Reproductive/Obstetrics negative OB ROS                              Anesthesia Physical Anesthesia Plan  ASA: 3  Anesthesia Plan: General   Post-op Pain Management:    Induction: Intravenous  PONV Risk Score and Plan:   Airway Management Planned: Natural Airway and Nasal Cannula  Additional Equipment:   Intra-op Plan:   Post-operative Plan:   Informed Consent: I have reviewed the patients History and Physical, chart, labs and discussed the procedure including  the risks, benefits and alternatives for the proposed anesthesia with the patient or authorized representative who has indicated his/her understanding and acceptance.     Dental Advisory Given  Plan Discussed with: Anesthesiologist, CRNA and Surgeon  Anesthesia Plan Comments: (Patient consented for risks of anesthesia including but not limited to:  - adverse reactions to medications - risk of airway placement if required - damage to eyes, teeth, lips or other oral mucosa - nerve damage due to positioning  - sore throat or hoarseness - Damage to heart, brain, nerves, lungs, other parts of body or loss of life  Patient voiced understanding.)        Anesthesia Quick Evaluation

## 2023-04-03 ENCOUNTER — Telehealth: Payer: Self-pay | Admitting: *Deleted

## 2023-04-03 DIAGNOSIS — I2510 Atherosclerotic heart disease of native coronary artery without angina pectoris: Secondary | ICD-10-CM | POA: Diagnosis not present

## 2023-04-03 DIAGNOSIS — Z01818 Encounter for other preprocedural examination: Secondary | ICD-10-CM | POA: Diagnosis not present

## 2023-04-03 DIAGNOSIS — I340 Nonrheumatic mitral (valve) insufficiency: Secondary | ICD-10-CM | POA: Diagnosis not present

## 2023-04-03 DIAGNOSIS — I10 Essential (primary) hypertension: Secondary | ICD-10-CM | POA: Diagnosis not present

## 2023-04-03 DIAGNOSIS — Z8673 Personal history of transient ischemic attack (TIA), and cerebral infarction without residual deficits: Secondary | ICD-10-CM | POA: Diagnosis not present

## 2023-04-03 DIAGNOSIS — E782 Mixed hyperlipidemia: Secondary | ICD-10-CM | POA: Diagnosis not present

## 2023-04-03 NOTE — Telephone Encounter (Signed)
Received fax from [facility]  Patient is cleared to have procedure and should stop taking Plavix 5 days before prior and restart 2 days after.  Stop aspirin 81 mg the morning of procedure.  Patient verbalized understanding.

## 2023-04-04 NOTE — Telephone Encounter (Signed)
Also received fax from [facility] regarding procedure clearance which patient is clear to proceeded.

## 2023-04-08 ENCOUNTER — Ambulatory Visit
Admission: RE | Admit: 2023-04-08 | Discharge: 2023-04-08 | Disposition: A | Discharge status: Home / Self Care | Payer: Medicare Other | Attending: Gastroenterology | Admitting: Gastroenterology

## 2023-04-08 ENCOUNTER — Other Ambulatory Visit: Payer: Self-pay

## 2023-04-08 ENCOUNTER — Encounter: Payer: Self-pay | Admitting: Gastroenterology

## 2023-04-08 ENCOUNTER — Ambulatory Visit: Payer: Medicare Other | Admitting: Anesthesiology

## 2023-04-08 ENCOUNTER — Encounter: Payer: Self-pay | Admitting: Internal Medicine

## 2023-04-08 ENCOUNTER — Encounter
Admission: RE | Disposition: A | Discharge status: Home / Self Care | Payer: Self-pay | Source: Home / Self Care | Attending: Gastroenterology

## 2023-04-08 DIAGNOSIS — G4730 Sleep apnea, unspecified: Secondary | ICD-10-CM | POA: Diagnosis not present

## 2023-04-08 DIAGNOSIS — F32A Depression, unspecified: Secondary | ICD-10-CM | POA: Insufficient documentation

## 2023-04-08 DIAGNOSIS — Z860100 Personal history of colon polyps, unspecified: Secondary | ICD-10-CM

## 2023-04-08 DIAGNOSIS — D125 Benign neoplasm of sigmoid colon: Secondary | ICD-10-CM | POA: Insufficient documentation

## 2023-04-08 DIAGNOSIS — K5730 Diverticulosis of large intestine without perforation or abscess without bleeding: Secondary | ICD-10-CM | POA: Insufficient documentation

## 2023-04-08 DIAGNOSIS — I2510 Atherosclerotic heart disease of native coronary artery without angina pectoris: Secondary | ICD-10-CM | POA: Insufficient documentation

## 2023-04-08 DIAGNOSIS — Z1211 Encounter for screening for malignant neoplasm of colon: Secondary | ICD-10-CM | POA: Diagnosis not present

## 2023-04-08 DIAGNOSIS — E785 Hyperlipidemia, unspecified: Secondary | ICD-10-CM | POA: Insufficient documentation

## 2023-04-08 DIAGNOSIS — D123 Benign neoplasm of transverse colon: Secondary | ICD-10-CM | POA: Diagnosis not present

## 2023-04-08 DIAGNOSIS — D126 Benign neoplasm of colon, unspecified: Secondary | ICD-10-CM | POA: Diagnosis not present

## 2023-04-08 DIAGNOSIS — I129 Hypertensive chronic kidney disease with stage 1 through stage 4 chronic kidney disease, or unspecified chronic kidney disease: Secondary | ICD-10-CM | POA: Diagnosis not present

## 2023-04-08 DIAGNOSIS — K641 Second degree hemorrhoids: Secondary | ICD-10-CM | POA: Insufficient documentation

## 2023-04-08 DIAGNOSIS — N183 Chronic kidney disease, stage 3 unspecified: Secondary | ICD-10-CM | POA: Insufficient documentation

## 2023-04-08 DIAGNOSIS — K635 Polyp of colon: Secondary | ICD-10-CM

## 2023-04-08 DIAGNOSIS — K219 Gastro-esophageal reflux disease without esophagitis: Secondary | ICD-10-CM | POA: Insufficient documentation

## 2023-04-08 HISTORY — PX: COLONOSCOPY WITH PROPOFOL: SHX5780

## 2023-04-08 HISTORY — DX: Chronic kidney disease, stage 3 unspecified: N18.30

## 2023-04-08 HISTORY — PX: POLYPECTOMY: SHX5525

## 2023-04-08 SURGERY — COLONOSCOPY WITH PROPOFOL
Anesthesia: General | Site: Rectum

## 2023-04-08 MED ORDER — SODIUM CHLORIDE 0.9 % IV SOLN: INTRAVENOUS | Status: DC

## 2023-04-08 MED ORDER — PROPOFOL 10 MG/ML IV BOLUS
INTRAVENOUS | Status: DC | PRN
  Administered 2023-04-08: 20 mg via INTRAVENOUS
  Administered 2023-04-08: 80 mg via INTRAVENOUS
  Administered 2023-04-08: 20 mg via INTRAVENOUS

## 2023-04-08 MED ORDER — STERILE WATER FOR IRRIGATION IR SOLN
Status: DC | PRN
  Administered 2023-04-08: 50 mL

## 2023-04-08 MED ORDER — LACTATED RINGERS IV SOLN: INTRAVENOUS | Status: DC

## 2023-04-08 MED ORDER — STERILE WATER FOR IRRIGATION IR SOLN
Status: DC | PRN
  Administered 2023-04-08: .05 mL

## 2023-04-08 SURGICAL SUPPLY — 8 items
GOWN CVR UNV OPN BCK APRN NK (MISCELLANEOUS) ×4 IMPLANT
GOWN ISOL THUMB LOOP REG UNIV (MISCELLANEOUS) ×4
KIT PRC NS LF DISP ENDO (KITS) ×2 IMPLANT
KIT PROCEDURE OLYMPUS (KITS) ×2
MANIFOLD NEPTUNE II (INSTRUMENTS) ×2 IMPLANT
SNARE COLD EXACTO (MISCELLANEOUS) IMPLANT
TRAP ETRAP POLY (MISCELLANEOUS) IMPLANT
WATER STERILE IRR 250ML POUR (IV SOLUTION) ×2 IMPLANT

## 2023-04-08 NOTE — Transfer of Care (Signed)
Immediate Anesthesia Transfer of Care Note  Patient: Luis Strong  Procedure(s) Performed: COLONOSCOPY WITH PROPOFOL (Rectum) POLYPECTOMY (Rectum)  Patient Location: PACU  Anesthesia Type:General  Level of Consciousness: drowsy and patient cooperative  Airway & Oxygen Therapy: Patient Spontanous Breathing  Post-op Assessment: Report given to RN and Post -op Vital signs reviewed and stable  Post vital signs: Reviewed and stable  Last Vitals:  Vitals Value Taken Time  BP 102/47 04/08/23 1111  Temp 36.4 C 04/08/23 1110  Pulse 90 04/08/23 1111  Resp 16 04/08/23 1111  SpO2 92 % 04/08/23 1111  Vitals shown include unvalidated device data.  Last Pain:  Vitals:   04/08/23 1110  TempSrc:   PainSc: 0-No pain         Complications: No notable events documented.

## 2023-04-08 NOTE — Op Note (Signed)
[facility] Gastroenterology Patient Name: Luis Strong Procedure Date: 04/08/2023 10:46 AM MRN: [id number] Account #: [id number] Date of Birth: [birth year] Admit Type: Outpatient Age: 77 Room: [facility] OR ROOM 01 Gender: Male Note Status: Finalized Instrument Name: [id number] Procedure:             Colonoscopy Indications:           High risk colon cancer surveillance: Personal history                         of colonic polyps Providers:             Midge Minium MD, MD Referring MD:          Duanne Limerick, MD (Referring MD) Medicines:             Propofol per Anesthesia Complications:         No immediate complications. Procedure:             Pre-Anesthesia Assessment:                        - Prior to the procedure, a History and Physical was                         performed, and patient medications and allergies were                         reviewed. The patient's tolerance of previous                         anesthesia was also reviewed. The risks and benefits                         of the procedure and the sedation options and risks                         were discussed with the patient. All questions were                         answered, and informed consent was obtained. Prior                         Anticoagulants: The patient has taken no anticoagulant                         or antiplatelet agents. ASA Grade Assessment: II - A                         patient with mild systemic disease. After reviewing                         the risks and benefits, the patient was deemed in                         satisfactory condition to undergo the procedure.                        After obtaining informed consent, the colonoscope was  passed under direct vision. Throughout the procedure,                         the patient's blood pressure, pulse, and oxygen                         saturations were monitored continuously. The                          Colonoscope was introduced through the anus and                         advanced to the the cecum, identified by appendiceal                         orifice and ileocecal valve. The colonoscopy was                         performed without difficulty. The patient tolerated                         the procedure well. The quality of the bowel                         preparation was excellent. Findings:      The perianal and digital rectal examinations were normal.      Two sessile polyps were found in the transverse colon. The polyps were 3       to 4 mm in size. These polyps were removed with a cold snare. Resection       and retrieval were complete.      A 4 mm polyp was found in the sigmoid colon. The polyp was sessile. The       polyp was removed with a cold snare. Resection and retrieval were       complete.      Non-bleeding internal hemorrhoids were found during retroflexion. The       hemorrhoids were Grade II (internal hemorrhoids that prolapse but reduce       spontaneously).      Many small-mouthed diverticula were found in the sigmoid colon. Impression:            - Two 3 to 4 mm polyps in the transverse colon,                         removed with a cold snare. Resected and retrieved.                        - One 4 mm polyp in the sigmoid colon, removed with a                         cold snare. Resected and retrieved.                        - Non-bleeding internal hemorrhoids.                        - Diverticulosis in the sigmoid colon. Recommendation:        - Discharge patient to home.                        -  Resume previous diet.                        - Continue present medications.                        - Await pathology results.                        - Repeat colonoscopy is not recommended for                         surveillance. Procedure Code(s):     --- Professional ---                        [phone], Colonoscopy, flexible; with removal of                          tumor(s), polyp(s), or other lesion(s) by snare                         technique Diagnosis Code(s):     --- Professional ---                        Z86.010, Personal history of colonic polyps                        D12.3, Benign neoplasm of transverse colon (hepatic                         flexure or splenic flexure)                        D12.5, Benign neoplasm of sigmoid colon CPT copyright 2022 [employer]. All rights reserved. The codes documented in this report are preliminary and upon coder review may  be revised to meet current compliance requirements. Midge Minium MD, MD 04/08/2023 11:07:11 AM This report has been signed electronically. Number of Addenda: 0 Note Initiated On: 04/08/2023 10:46 AM Scope Withdrawal Time: 0 hours 8 minutes 0 seconds  Total Procedure Duration: 0 hours 11 minutes 33 seconds  Estimated Blood Loss:  Estimated blood loss: none.      [facility]

## 2023-04-08 NOTE — Anesthesia Postprocedure Evaluation (Signed)
Anesthesia Post Note  Patient: Luis Strong  Procedure(s) Performed: COLONOSCOPY WITH PROPOFOL (Rectum) POLYPECTOMY (Rectum)  Patient location during evaluation: PACU Anesthesia Type: General Level of consciousness: awake and alert Pain management: pain level controlled Vital Signs Assessment: post-procedure vital signs reviewed and stable Respiratory status: spontaneous breathing, nonlabored ventilation, respiratory function stable and patient connected to nasal cannula oxygen Cardiovascular status: blood pressure returned to baseline and stable Postop Assessment: no apparent nausea or vomiting Anesthetic complications: no   No notable events documented.   Last Vitals:  Vitals:   04/08/23 1111 04/08/23 1119  BP: (!) 102/47 109/66  Pulse:  83  Resp: 20 20  Temp:  (!) 36.4 C  SpO2: 95% 95%    Last Pain:  Vitals:   04/08/23 1119  TempSrc:   PainSc: 0-No pain                 Blaine Guiffre C Enolia Koepke
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
## 2023-04-09 ENCOUNTER — Encounter: Payer: Self-pay | Admitting: Gastroenterology

## 2023-04-17 ENCOUNTER — Other Ambulatory Visit: Payer: Self-pay

## 2023-04-18 ENCOUNTER — Encounter: Payer: Self-pay | Admitting: Gastroenterology

## 2023-04-22 DIAGNOSIS — Z872 Personal history of diseases of the skin and subcutaneous tissue: Secondary | ICD-10-CM | POA: Diagnosis not present

## 2023-04-22 DIAGNOSIS — L570 Actinic keratosis: Secondary | ICD-10-CM | POA: Diagnosis not present

## 2023-04-22 DIAGNOSIS — L298 Other pruritus: Secondary | ICD-10-CM | POA: Diagnosis not present

## 2023-04-22 DIAGNOSIS — Z85828 Personal history of other malignant neoplasm of skin: Secondary | ICD-10-CM | POA: Diagnosis not present

## 2023-04-22 DIAGNOSIS — C4441 Basal cell carcinoma of skin of scalp and neck: Secondary | ICD-10-CM | POA: Diagnosis not present

## 2023-04-22 DIAGNOSIS — D485 Neoplasm of uncertain behavior of skin: Secondary | ICD-10-CM | POA: Diagnosis not present

## 2023-04-22 DIAGNOSIS — L821 Other seborrheic keratosis: Secondary | ICD-10-CM | POA: Diagnosis not present

## 2023-04-22 DIAGNOSIS — L853 Xerosis cutis: Secondary | ICD-10-CM | POA: Diagnosis not present

## 2023-04-22 DIAGNOSIS — L578 Other skin changes due to chronic exposure to nonionizing radiation: Secondary | ICD-10-CM | POA: Diagnosis not present

## 2023-05-15 DIAGNOSIS — C4441 Basal cell carcinoma of skin of scalp and neck: Secondary | ICD-10-CM | POA: Diagnosis not present

## 2023-05-15 DIAGNOSIS — C4491 Basal cell carcinoma of skin, unspecified: Secondary | ICD-10-CM | POA: Diagnosis not present

## 2023-06-11 ENCOUNTER — Other Ambulatory Visit: Payer: Self-pay | Admitting: Family Medicine

## 2023-06-11 DIAGNOSIS — F4323 Adjustment disorder with mixed anxiety and depressed mood: Secondary | ICD-10-CM

## 2023-06-17 ENCOUNTER — Other Ambulatory Visit: Payer: Self-pay | Admitting: Family Medicine

## 2023-06-17 DIAGNOSIS — I10 Essential (primary) hypertension: Secondary | ICD-10-CM

## 2023-06-17 NOTE — Telephone Encounter (Signed)
Medication Refill - Medication: metoprolol succinate (TOPROL-XL) 25 MG 24 hr tablet Pt states that he has two weeks worth of pills left.   Next appt with PCP 07/01/23   Has the patient contacted their pharmacy? Yes.   Pharmacy advised pt to call PCP for refill request.   Preferred Pharmacy (with phone number or street name): [facility] - [place], [place] - [address]  Phone: [phone] Fax: [phone]  Has the patient been seen for an appointment in the last year OR does the patient have an upcoming appointment? Yes.    Agent: Please be advised that RX refills may take up to 3 business days. We ask that you follow-up with your pharmacy.

## 2023-06-18 MED ORDER — METOPROLOL SUCCINATE ER 25 MG PO TB24
25.0000 mg | ORAL_TABLET | Freq: Every day | ORAL | 0 refills | Status: DC
Start: 2023-06-18 — End: 2023-07-01

## 2023-06-18 NOTE — Telephone Encounter (Signed)
Requested Prescriptions  Pending Prescriptions Disp Refills   metoprolol succinate (TOPROL-XL) 25 MG 24 hr tablet 90 tablet 0    Sig: Take 1 tablet (25 mg total) by mouth at bedtime.     Cardiovascular:  Beta Blockers Passed - 06/17/2023 11:25 AM      Passed - Last BP in normal range    BP Readings from Last 1 Encounters:  04/08/23 109/66         Passed - Last Heart Rate in normal range    Pulse Readings from Last 1 Encounters:  04/08/23 83         Passed - Valid encounter within last 6 months    Recent Outpatient Visits           5 months ago Adjustment disorder with mixed anxiety and depressed mood   [facility], [facility]   11 months ago Essential hypertension   [facility], MD   1 year ago Adjustment disorder with mixed anxiety and depressed mood   [facility], MD   1 year ago Hospital discharge follow-up   [facility], MD   1 year ago Essential hypertension   [facility], MD       Future Appointments             In 1 week Duanne Limerick, MD [facility]   In 7 months Stoioff, Verna Czech, MD [facility] [place]

## 2023-06-25 DIAGNOSIS — N1832 Chronic kidney disease, stage 3b: Secondary | ICD-10-CM | POA: Diagnosis not present

## 2023-06-25 DIAGNOSIS — I10 Essential (primary) hypertension: Secondary | ICD-10-CM | POA: Diagnosis not present

## 2023-07-01 ENCOUNTER — Ambulatory Visit: Payer: Medicare Other | Admitting: Family Medicine

## 2023-07-01 ENCOUNTER — Encounter: Payer: Self-pay | Admitting: Family Medicine

## 2023-07-01 ENCOUNTER — Ambulatory Visit (INDEPENDENT_AMBULATORY_CARE_PROVIDER_SITE_OTHER): Payer: Medicare Other | Admitting: Family Medicine

## 2023-07-01 VITALS — BP 118/70 | HR 65 | Ht 69.0 in | Wt 206.0 lb

## 2023-07-01 DIAGNOSIS — I10 Essential (primary) hypertension: Secondary | ICD-10-CM

## 2023-07-01 DIAGNOSIS — E782 Mixed hyperlipidemia: Secondary | ICD-10-CM | POA: Diagnosis not present

## 2023-07-01 DIAGNOSIS — F4323 Adjustment disorder with mixed anxiety and depressed mood: Secondary | ICD-10-CM | POA: Diagnosis not present

## 2023-07-01 MED ORDER — METOPROLOL SUCCINATE ER 25 MG PO TB24: 25.0000 mg | ORAL_TABLET | Freq: Every day | ORAL | 1 refills | Status: DC

## 2023-07-01 MED ORDER — ROSUVASTATIN CALCIUM 20 MG PO TABS: 20.0000 mg | ORAL_TABLET | Freq: Every day | ORAL | 1 refills | Status: DC

## 2023-07-01 MED ORDER — CITALOPRAM HYDROBROMIDE 20 MG PO TABS: 20.0000 mg | ORAL_TABLET | Freq: Every day | ORAL | 1 refills | Status: DC
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
## 2023-07-29 ENCOUNTER — Other Ambulatory Visit: Payer: Self-pay | Admitting: Medical Genetics

## 2023-07-29 ENCOUNTER — Other Ambulatory Visit
Admission: RE | Admit: 2023-07-29 | Discharge: 2023-07-29 | Disposition: A | Discharge status: Home / Self Care | Payer: Medicare Other | Attending: Medical Genetics | Admitting: Medical Genetics

## 2023-07-29 DIAGNOSIS — Z006 Encounter for examination for normal comparison and control in clinical research program: Secondary | ICD-10-CM | POA: Insufficient documentation

## 2023-08-06 LAB — GENECONNECT MOLECULAR SCREEN: Genetic Analysis Overall Interpretation: NEGATIVE

## 2023-08-29 DIAGNOSIS — R531 Weakness: Secondary | ICD-10-CM | POA: Diagnosis not present

## 2023-08-29 DIAGNOSIS — R200 Anesthesia of skin: Secondary | ICD-10-CM | POA: Diagnosis not present

## 2023-08-29 DIAGNOSIS — Z8673 Personal history of transient ischemic attack (TIA), and cerebral infarction without residual deficits: Secondary | ICD-10-CM | POA: Diagnosis not present

## 2023-09-09 ENCOUNTER — Ambulatory Visit (INDEPENDENT_AMBULATORY_CARE_PROVIDER_SITE_OTHER): Payer: Medicare Other | Admitting: Internal Medicine

## 2023-09-09 VITALS — BP 135/67 | HR 60 | Resp 16 | Ht 69.0 in | Wt 210.5 lb

## 2023-09-09 DIAGNOSIS — Z7189 Other specified counseling: Secondary | ICD-10-CM

## 2023-09-09 DIAGNOSIS — G4733 Obstructive sleep apnea (adult) (pediatric): Secondary | ICD-10-CM

## 2023-09-09 DIAGNOSIS — I10 Essential (primary) hypertension: Secondary | ICD-10-CM
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
## 2023-09-09 NOTE — Patient Instructions (Signed)

## 2023-10-28 DIAGNOSIS — I10 Essential (primary) hypertension: Secondary | ICD-10-CM | POA: Diagnosis not present

## 2023-10-28 DIAGNOSIS — N1831 Chronic kidney disease, stage 3a: Secondary | ICD-10-CM | POA: Diagnosis not present

## 2023-12-19 ENCOUNTER — Ambulatory Visit (INDEPENDENT_AMBULATORY_CARE_PROVIDER_SITE_OTHER): Payer: Medicare Other | Admitting: Family Medicine

## 2023-12-19 VITALS — BP 124/79 | HR 78 | Ht 69.0 in | Wt 209.0 lb

## 2023-12-19 DIAGNOSIS — Z23 Encounter for immunization: Secondary | ICD-10-CM

## 2023-12-19 DIAGNOSIS — E782 Mixed hyperlipidemia: Secondary | ICD-10-CM

## 2023-12-19 DIAGNOSIS — F4323 Adjustment disorder with mixed anxiety and depressed mood: Secondary | ICD-10-CM | POA: Diagnosis not present

## 2023-12-19 DIAGNOSIS — I10 Essential (primary) hypertension: Secondary | ICD-10-CM | POA: Diagnosis not present

## 2023-12-19 MED ORDER — METOPROLOL SUCCINATE ER 25 MG PO TB24: 25.0000 mg | ORAL_TABLET | Freq: Every day | ORAL | 1 refills | Status: DC

## 2023-12-19 MED ORDER — ROSUVASTATIN CALCIUM 20 MG PO TABS: 20.0000 mg | ORAL_TABLET | Freq: Every day | ORAL | 1 refills | Status: DC

## 2023-12-19 MED ORDER — CITALOPRAM HYDROBROMIDE 20 MG PO TABS: 20.0000 mg | ORAL_TABLET | Freq: Every day | ORAL | 1 refills | Status: DC
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
## 2023-12-20 ENCOUNTER — Encounter: Payer: Self-pay | Admitting: Family Medicine

## 2023-12-20 LAB — LIPID PANEL WITH LDL/HDL RATIO
Cholesterol, Total: 177 mg/dL (ref 100–199)
HDL: 38 mg/dL — ABNORMAL LOW (ref >39)
LDL Chol Calc (NIH): 88 mg/dL (ref 0–99)
LDL/HDL Ratio: 2.3 {ratio} (ref 0.0–3.6)
Triglycerides: 307 mg/dL — ABNORMAL HIGH (ref 0–149)
VLDL Cholesterol Cal: 51 mg/dL — ABNORMAL HIGH (ref 5–40)

## 2023-12-20 NOTE — Patient Instructions (Signed)

## 2024-01-01 ENCOUNTER — Ambulatory Visit (INDEPENDENT_AMBULATORY_CARE_PROVIDER_SITE_OTHER): Payer: Medicare Other

## 2024-01-01 VITALS — BP 92/60 | Ht 69.0 in | Wt 207.6 lb

## 2024-01-01 DIAGNOSIS — Z0000 Encounter for general adult medical examination without abnormal findings: Secondary | ICD-10-CM | POA: Diagnosis not present

## 2024-01-01 NOTE — Patient Instructions (Addendum)
Luis Strong , Thank you for taking time to come for your Medicare Wellness Visit. I appreciate your ongoing commitment to your health goals. Please review the following plan we discussed and let me know if I can assist you in the future.   Referrals/Orders/Follow-Ups/Clinician Recommendations: NONE  This is a list of the screening recommended for you and due dates:  Health Maintenance  Topic Date Due   COVID-19 Vaccine (6 - 2024-25 season) 07/21/2023   Medicare Annual Wellness Visit  12/31/2024   Colon Cancer Screening  04/07/2028   Pneumonia Vaccine  Completed   Flu Shot  Completed   Hepatitis C Screening  Completed   Zoster (Shingles) Vaccine  Completed   HPV Vaccine  Aged Out   DTaP/Tdap/Td vaccine  Discontinued    Advanced directives: (ACP Link)Information on Advanced Care Planning can be found at [facility] [place] Advance Health Care Directives Advance Health Care Directives ([email or web address]   Next Medicare Annual Wellness Visit scheduled for next year: Yes  01/06/25 @ 11:30 AM IN PERSON
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
## 2024-01-29 ENCOUNTER — Ambulatory Visit
Admission: RE | Admit: 2024-01-29 | Discharge: 2024-01-29 | Disposition: A | Discharge status: Home / Self Care | Source: Ambulatory Visit | Attending: Urology | Admitting: Urology

## 2024-01-29 ENCOUNTER — Ambulatory Visit
Admission: RE | Admit: 2024-01-29 | Discharge: 2024-01-29 | Disposition: A | Discharge status: Home / Self Care | Attending: Urology | Admitting: Urology

## 2024-01-29 ENCOUNTER — Ambulatory Visit (INDEPENDENT_AMBULATORY_CARE_PROVIDER_SITE_OTHER): Payer: Medicare Other | Admitting: Urology

## 2024-01-29 VITALS — BP 119/77 | HR 73 | Ht 69.0 in | Wt 204.0 lb

## 2024-01-29 DIAGNOSIS — N200 Calculus of kidney: Secondary | ICD-10-CM | POA: Diagnosis not present

## 2024-01-29 DIAGNOSIS — N2889 Other specified disorders of kidney and ureter: Secondary | ICD-10-CM | POA: Diagnosis not present
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
## 2024-01-31 ENCOUNTER — Encounter: Payer: Self-pay | Admitting: Urology

## 2024-02-13 ENCOUNTER — Other Ambulatory Visit: Payer: Self-pay | Admitting: Family Medicine

## 2024-02-13 DIAGNOSIS — F4323 Adjustment disorder with mixed anxiety and depressed mood: Secondary | ICD-10-CM

## 2024-02-19 DIAGNOSIS — N1832 Chronic kidney disease, stage 3b: Secondary | ICD-10-CM | POA: Diagnosis not present

## 2024-02-19 DIAGNOSIS — I10 Essential (primary) hypertension: Secondary | ICD-10-CM | POA: Diagnosis not present

## 2024-02-20 ENCOUNTER — Other Ambulatory Visit: Payer: Self-pay | Admitting: Family Medicine

## 2024-02-20 DIAGNOSIS — I10 Essential (primary) hypertension: Secondary | ICD-10-CM

## 2024-02-24 ENCOUNTER — Other Ambulatory Visit: Payer: Self-pay | Admitting: Family Medicine

## 2024-02-24 DIAGNOSIS — E782 Mixed hyperlipidemia: Secondary | ICD-10-CM

## 2024-04-01 DIAGNOSIS — I10 Essential (primary) hypertension: Secondary | ICD-10-CM | POA: Diagnosis not present

## 2024-04-01 DIAGNOSIS — E782 Mixed hyperlipidemia: Secondary | ICD-10-CM | POA: Diagnosis not present

## 2024-04-01 DIAGNOSIS — R079 Chest pain, unspecified: Secondary | ICD-10-CM | POA: Diagnosis not present

## 2024-04-01 DIAGNOSIS — R0682 Tachypnea, not elsewhere classified: Secondary | ICD-10-CM | POA: Diagnosis not present

## 2024-04-01 DIAGNOSIS — R9439 Abnormal result of other cardiovascular function study: Secondary | ICD-10-CM | POA: Diagnosis not present

## 2024-04-01 DIAGNOSIS — I340 Nonrheumatic mitral (valve) insufficiency: Secondary | ICD-10-CM | POA: Diagnosis not present

## 2024-04-01 DIAGNOSIS — G4733 Obstructive sleep apnea (adult) (pediatric): Secondary | ICD-10-CM | POA: Diagnosis not present

## 2024-04-01 DIAGNOSIS — Z8673 Personal history of transient ischemic attack (TIA), and cerebral infarction without residual deficits: Secondary | ICD-10-CM | POA: Diagnosis not present

## 2024-04-01 DIAGNOSIS — I63321 Cerebral infarction due to thrombosis of right anterior cerebral artery: Secondary | ICD-10-CM | POA: Diagnosis not present

## 2024-04-01 DIAGNOSIS — I2510 Atherosclerotic heart disease of native coronary artery without angina pectoris: Secondary | ICD-10-CM | POA: Diagnosis not present

## 2024-04-02 ENCOUNTER — Other Ambulatory Visit: Payer: Self-pay | Admitting: Internal Medicine

## 2024-04-02 DIAGNOSIS — I340 Nonrheumatic mitral (valve) insufficiency: Secondary | ICD-10-CM

## 2024-04-02 DIAGNOSIS — R079 Chest pain, unspecified: Secondary | ICD-10-CM

## 2024-04-07 ENCOUNTER — Ambulatory Visit
Admission: RE | Admit: 2024-04-07 | Discharge: 2024-04-07 | Disposition: A | Discharge status: Home / Self Care | Payer: Self-pay | Source: Ambulatory Visit | Attending: Internal Medicine | Admitting: Internal Medicine

## 2024-04-07 DIAGNOSIS — I340 Nonrheumatic mitral (valve) insufficiency: Secondary | ICD-10-CM | POA: Insufficient documentation

## 2024-04-07 DIAGNOSIS — R079 Chest pain, unspecified: Secondary | ICD-10-CM | POA: Insufficient documentation

## 2024-04-21 DIAGNOSIS — Z85828 Personal history of other malignant neoplasm of skin: Secondary | ICD-10-CM | POA: Diagnosis not present

## 2024-04-21 DIAGNOSIS — L570 Actinic keratosis: Secondary | ICD-10-CM | POA: Diagnosis not present

## 2024-04-21 DIAGNOSIS — L578 Other skin changes due to chronic exposure to nonionizing radiation: Secondary | ICD-10-CM | POA: Diagnosis not present

## 2024-04-21 DIAGNOSIS — L853 Xerosis cutis: Secondary | ICD-10-CM | POA: Diagnosis not present

## 2024-04-21 DIAGNOSIS — Z872 Personal history of diseases of the skin and subcutaneous tissue: Secondary | ICD-10-CM | POA: Diagnosis not present

## 2024-05-13 ENCOUNTER — Other Ambulatory Visit: Payer: Self-pay

## 2024-05-13 DIAGNOSIS — F4323 Adjustment disorder with mixed anxiety and depressed mood: Secondary | ICD-10-CM

## 2024-05-13 MED ORDER — CITALOPRAM HYDROBROMIDE 20 MG PO TABS
20.0000 mg | ORAL_TABLET | Freq: Every day | ORAL | 0 refills | Status: DC
Start: 2024-05-13 — End: 2024-05-15

## 2024-05-15 ENCOUNTER — Ambulatory Visit (INDEPENDENT_AMBULATORY_CARE_PROVIDER_SITE_OTHER): Admitting: Family Medicine

## 2024-05-15 ENCOUNTER — Encounter: Payer: Self-pay | Admitting: Family Medicine

## 2024-05-15 VITALS — BP 110/72 | HR 65 | Ht 69.0 in | Wt 205.6 lb

## 2024-05-15 DIAGNOSIS — J9601 Acute respiratory failure with hypoxia: Secondary | ICD-10-CM | POA: Insufficient documentation

## 2024-05-15 DIAGNOSIS — N1832 Chronic kidney disease, stage 3b: Secondary | ICD-10-CM | POA: Diagnosis not present

## 2024-05-15 DIAGNOSIS — I10 Essential (primary) hypertension: Secondary | ICD-10-CM

## 2024-05-15 DIAGNOSIS — G4733 Obstructive sleep apnea (adult) (pediatric): Secondary | ICD-10-CM | POA: Diagnosis not present

## 2024-05-15 DIAGNOSIS — F4323 Adjustment disorder with mixed anxiety and depressed mood: Secondary | ICD-10-CM

## 2024-05-15 DIAGNOSIS — E782 Mixed hyperlipidemia: Secondary | ICD-10-CM | POA: Diagnosis not present

## 2024-05-15 DIAGNOSIS — I63321 Cerebral infarction due to thrombosis of right anterior cerebral artery: Secondary | ICD-10-CM

## 2024-05-15 DIAGNOSIS — F3342 Major depressive disorder, recurrent, in full remission: Secondary | ICD-10-CM | POA: Diagnosis not present

## 2024-05-15 MED ORDER — CITALOPRAM HYDROBROMIDE 20 MG PO TABS
20.0000 mg | ORAL_TABLET | Freq: Every day | ORAL | 4 refills | Status: AC
Start: 2024-05-15 — End: ?

## 2024-05-15 MED ORDER — ROSUVASTATIN CALCIUM 20 MG PO TABS: 20.0000 mg | ORAL_TABLET | Freq: Every day | ORAL | 1 refills | Status: DC

## 2024-05-15 NOTE — Assessment & Plan Note (Signed)
 On Crestor  20 mg every day, Aspirin , Plavix. Follows Neurology.

## 2024-05-15 NOTE — Assessment & Plan Note (Signed)
Compliant 

## 2024-05-15 NOTE — Assessment & Plan Note (Signed)
 Avoid NSAIDs, follows Nephrology

## 2024-05-15 NOTE — Assessment & Plan Note (Signed)
 Controlled, On Toprol  XL continue current medication.

## 2024-05-15 NOTE — Assessment & Plan Note (Signed)
 Stable with Celexa  20 mg every day , refills sent.

## 2024-05-15 NOTE — Progress Notes (Signed)
 Established Patient Office Visit  Subjective   Patient ID: Luis Strong, male    DOB: [birth year]  Age: 78 y.o. MRN: [id number]  Chief Complaint  Patient presents with   Establish Care    Patient presents today to establish care. He is doing well today and has no concerns.      Assessment & Plan:   Problem List Items Addressed This Visit       Cardiovascular and Mediastinum   Cerebrovascular accident (CVA) due to thrombosis of right anterior cerebral artery (HCC)   On Crestor  20 mg every day, Aspirin , Plavix. Follows Neurology.      Relevant Medications   rosuvastatin  (CRESTOR ) 20 MG tablet   Benign essential HTN   Controlled, On Toprol  XL continue current medication.      Relevant Medications   rosuvastatin  (CRESTOR ) 20 MG tablet     Respiratory   OSA treated with BiPAP   Compliant.        Genitourinary   Stage 3b chronic kidney disease (HCC) - Primary   Avoid NSAIDs, follows Nephrology        Other   Mixed hyperlipidemia   Lab Results  Component Value Date   LDLCALC 88 12/19/2023   On statin      Relevant Medications   rosuvastatin  (CRESTOR ) 20 MG tablet   Recurrent major depressive disorder, in full remission (HCC)   Stable with Celexa  20 mg every day , refills sent.      Relevant Medications   citalopram  (CELEXA ) 20 MG tablet   Other Visit Diagnoses       Adjustment disorder with mixed anxiety and depressed mood       Relevant Medications   citalopram  (CELEXA ) 20 MG tablet     Combined fat and carbohydrate induced hyperlipemia       Relevant Medications   rosuvastatin  (CRESTOR ) 20 MG tablet       Return in about 6 months (around 11/14/2024) for chronic follow up with PCP.   HPI  Luis Strong is 78 y/o pleasant male with chronic kidney disease stage IIIb and hypertension, OSA, CVA with some left side residual deficit, HLD,GERD.  Has no issues.   Follows Cardiology, Nephrology, Neurology.   He is a widower, wife deceased in 2019/05/31 due  to breast cancer, He has a son who is Teacher, early years/pre in [place] visits him weekly and [place] has one son who lives in [place].   Saw Card :    Review of Systems  All other systems reviewed and are negative.     Objective:     BP 110/72   Pulse 65   Ht 5' 9 (1.753 m)   Wt 205 lb 9.6 oz (93.3 kg)   SpO2 99%   BMI 30.36 kg/m    Physical Exam Vitals and nursing note reviewed.  Constitutional:      Appearance: Normal appearance.  HENT:     Head: Normocephalic.     Right Ear: External ear normal.     Left Ear: External ear normal.   Eyes:     Conjunctiva/sclera: Conjunctivae normal.    Cardiovascular:     Rate and Rhythm: Normal rate.  Pulmonary:     Effort: Pulmonary effort is normal. No respiratory distress.  Abdominal:     Palpations: Abdomen is soft.   Musculoskeletal:        General: Normal range of motion.   Skin:    General: Skin is warm.   Neurological:  Mental Status: He is alert and oriented to person, place, and time.   Psychiatric:        Mood and Affect: Mood normal.      No results found for any visits on 05/15/24.    The ASCVD Risk score (Arnett DK, et al., 2019) failed to calculate for the following reasons:   Risk score cannot be calculated because patient has a medical history suggesting prior/existing ASCVD      Vinary K Rhone Ozaki, MD

## 2024-05-15 NOTE — Assessment & Plan Note (Signed)
 Lab Results  Component Value Date   LDLCALC 88 12/19/2023   On statin

## 2024-05-20 ENCOUNTER — Other Ambulatory Visit: Payer: Self-pay | Admitting: Internal Medicine

## 2024-05-20 DIAGNOSIS — R931 Abnormal findings on diagnostic imaging of heart and coronary circulation: Secondary | ICD-10-CM

## 2024-06-05 ENCOUNTER — Telehealth (HOSPITAL_COMMUNITY): Payer: Self-pay | Admitting: *Deleted

## 2024-06-05 NOTE — Telephone Encounter (Signed)
Reaching out to patient to offer assistance regarding upcoming cardiac imaging study; pt verbalizes understanding of appt date/time, parking situation and where to check in, pre-test NPO status  and verified current allergies; name and call back number provided for further questions should they arise ? ?Adalaya Irion RN Navigator Cardiac Imaging ?[facility] ?[phone] office ?[phone] cell ? ?

## 2024-06-08 ENCOUNTER — Ambulatory Visit
Admission: RE | Admit: 2024-06-08 | Discharge: 2024-06-08 | Disposition: A | Discharge status: Home / Self Care | Source: Ambulatory Visit | Attending: Internal Medicine | Admitting: Internal Medicine

## 2024-06-08 DIAGNOSIS — I2510 Atherosclerotic heart disease of native coronary artery without angina pectoris: Secondary | ICD-10-CM | POA: Diagnosis not present

## 2024-06-08 DIAGNOSIS — R931 Abnormal findings on diagnostic imaging of heart and coronary circulation: Secondary | ICD-10-CM | POA: Insufficient documentation

## 2024-06-08 DIAGNOSIS — R9430 Abnormal result of cardiovascular function study, unspecified: Secondary | ICD-10-CM | POA: Diagnosis not present

## 2024-06-08 LAB — POCT I-STAT CREATININE: Creatinine, Ser: 1.7 mg/dL — ABNORMAL HIGH (ref 0.61–1.24)

## 2024-06-08 MED ORDER — METOPROLOL TARTRATE 5 MG/5ML IV SOLN: 10.0000 mg | Freq: Once | INTRAVENOUS | Status: DC | PRN

## 2024-06-08 MED ORDER — DILTIAZEM HCL 25 MG/5ML IV SOLN: 10.0000 mg | INTRAVENOUS | Status: DC | PRN

## 2024-06-08 MED ORDER — NITROGLYCERIN 0.4 MG SL SUBL
0.8000 mg | SUBLINGUAL_TABLET | Freq: Once | SUBLINGUAL | Status: AC
  Administered 2024-06-08: 0.8 mg via SUBLINGUAL
  Filled 2024-06-08: qty 25

## 2024-06-08 MED ORDER — IOHEXOL 350 MG/ML SOLN
100.0000 mL | Freq: Once | INTRAVENOUS | Status: AC | PRN
  Administered 2024-06-08: 80 mL via INTRAVENOUS

## 2024-06-11 ENCOUNTER — Ambulatory Visit
Admission: RE | Admit: 2024-06-11 | Discharge: 2024-06-11 | Disposition: A | Discharge status: Home / Self Care | Source: Ambulatory Visit | Attending: Cardiology | Admitting: Cardiology

## 2024-06-11 ENCOUNTER — Other Ambulatory Visit: Payer: Self-pay | Admitting: Cardiology

## 2024-06-11 DIAGNOSIS — R931 Abnormal findings on diagnostic imaging of heart and coronary circulation: Secondary | ICD-10-CM | POA: Insufficient documentation

## 2024-06-11 DIAGNOSIS — I25708 Atherosclerosis of coronary artery bypass graft(s), unspecified, with other forms of angina pectoris: Secondary | ICD-10-CM | POA: Insufficient documentation

## 2024-07-02 DIAGNOSIS — N1831 Chronic kidney disease, stage 3a: Secondary | ICD-10-CM | POA: Diagnosis not present

## 2024-07-02 DIAGNOSIS — I10 Essential (primary) hypertension: Secondary | ICD-10-CM | POA: Diagnosis not present

## 2024-07-28 ENCOUNTER — Telehealth: Payer: Self-pay | Admitting: Family Medicine

## 2024-07-28 NOTE — Telephone Encounter (Signed)
 Patient requesting covid vaccination be ordered at:  [facility] #[id number] [facility], [place] - [address] AT [facility] OF [address] [place] [address] Phone: [phone] Fax: [phone]

## 2024-07-28 NOTE — Telephone Encounter (Unsigned)
 Copied from CRM [id number]. Topic: Clinical - Medication Refill >> Jul 28, 2024  3:33 PM Charlet HERO wrote: Medication: Covid shot  Has the patient contacted their pharmacy? Yes Will need to have a script sent in   This is the patient's preferred pharmacy:   [facility] #[id number], [place] - [address] AT [facility] OF [address] [facility] [place] [address] Phone: [phone] Fax: [phone]  Is this the correct pharmacy for this prescription? Yes If no, delete pharmacy and type the correct one.   Has the prescription been filled recently? No  Is the patient out of the medication? Yes  Has the patient been seen for an appointment in the last year OR does the patient have an upcoming appointment? Yes  Can we respond through MyChart? No  Agent: Please be advised that Rx refills may take up to 3 business days. We ask that you follow-up with your pharmacy.

## 2024-07-29 ENCOUNTER — Other Ambulatory Visit: Payer: Self-pay | Admitting: Family Medicine

## 2024-07-29 DIAGNOSIS — Z23 Encounter for immunization: Secondary | ICD-10-CM

## 2024-07-29 MED ORDER — COVID-19 MRNA VAC-TRIS(PFIZER) 30 MCG/0.3ML IM SUSY
0.3000 mL | PREFILLED_SYRINGE | Freq: Once | INTRAMUSCULAR | 0 refills | Status: AC
Start: 2024-07-29 — End: 2024-07-29

## 2024-07-29 NOTE — Telephone Encounter (Signed)
 Noted  KP

## 2024-07-29 NOTE — Telephone Encounter (Signed)
 Covid vaccine sent to pharmacy.

## 2024-08-03 ENCOUNTER — Other Ambulatory Visit: Payer: Self-pay

## 2024-08-03 MED ORDER — COVID-19 MRNA VAC-TRIS(PFIZER) 30 MCG/0.3ML IM SUSY: 0.3000 mL | PREFILLED_SYRINGE | Freq: Once | INTRAMUSCULAR | 0 refills | Status: AC

## 2024-08-12 DIAGNOSIS — E782 Mixed hyperlipidemia: Secondary | ICD-10-CM | POA: Diagnosis not present

## 2024-08-12 DIAGNOSIS — R931 Abnormal findings on diagnostic imaging of heart and coronary circulation: Secondary | ICD-10-CM | POA: Diagnosis not present

## 2024-08-12 DIAGNOSIS — R9439 Abnormal result of other cardiovascular function study: Secondary | ICD-10-CM | POA: Diagnosis not present

## 2024-08-26 DIAGNOSIS — R531 Weakness: Secondary | ICD-10-CM | POA: Diagnosis not present

## 2024-08-26 DIAGNOSIS — Z1331 Encounter for screening for depression: Secondary | ICD-10-CM | POA: Diagnosis not present

## 2024-08-26 DIAGNOSIS — R200 Anesthesia of skin: Secondary | ICD-10-CM | POA: Diagnosis not present

## 2024-08-26 DIAGNOSIS — Z8673 Personal history of transient ischemic attack (TIA), and cerebral infarction without residual deficits: Secondary | ICD-10-CM | POA: Diagnosis not present
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
## 2024-09-14 ENCOUNTER — Ambulatory Visit: Admitting: Internal Medicine

## 2024-09-14 VITALS — BP 121/71 | HR 68 | Resp 16 | Ht 69.0 in | Wt 216.0 lb

## 2024-09-14 DIAGNOSIS — G4733 Obstructive sleep apnea (adult) (pediatric): Secondary | ICD-10-CM | POA: Diagnosis not present

## 2024-09-14 DIAGNOSIS — Z7189 Other specified counseling: Secondary | ICD-10-CM

## 2024-09-14 DIAGNOSIS — I10 Essential (primary) hypertension: Secondary | ICD-10-CM

## 2024-09-14 NOTE — Patient Instructions (Signed)

## 2024-10-05 ENCOUNTER — Encounter: Payer: Self-pay | Admitting: Family Medicine

## 2024-10-05 ENCOUNTER — Ambulatory Visit: Payer: Self-pay

## 2024-10-05 ENCOUNTER — Other Ambulatory Visit: Payer: Self-pay | Admitting: Family Medicine

## 2024-10-05 ENCOUNTER — Ambulatory Visit (INDEPENDENT_AMBULATORY_CARE_PROVIDER_SITE_OTHER): Admitting: Family Medicine

## 2024-10-05 VITALS — BP 110/60 | HR 113 | Temp 98.2°F | Ht 69.0 in | Wt 208.0 lb

## 2024-10-05 DIAGNOSIS — R1030 Lower abdominal pain, unspecified: Secondary | ICD-10-CM

## 2024-10-05 DIAGNOSIS — R197 Diarrhea, unspecified: Secondary | ICD-10-CM

## 2024-10-05 MED ORDER — AZITHROMYCIN 250 MG PO TABS: ORAL_TABLET | ORAL | 0 refills | Status: AC

## 2024-10-05 MED ORDER — DICYCLOMINE HCL 10 MG PO CAPS
10.0000 mg | ORAL_CAPSULE | Freq: Three times a day (TID) | ORAL | 0 refills | Status: AC
Start: 2024-10-05 — End: ?

## 2024-10-05 NOTE — Telephone Encounter (Signed)
 Noted  Pt has appt.  KP

## 2024-10-05 NOTE — Telephone Encounter (Signed)
 FYI Only or Action Required?: FYI only for provider: appointment scheduled on 10/05/2024.   Patient was last seen in primary care on 05/15/2024 by Kotturi, Vinay K, MD.  Called Nurse Triage reporting Abdominal Pain.  Symptoms began several days ago.  Interventions attempted: OTC medications: tylenol .  Symptoms are: unchanged.  Triage Disposition: See Physician Within 24 Hours  Patient/caregiver understands and will follow disposition?: Yes   Copied from CRM #[id number]. Topic: Clinical - Red Word Triage >> Oct 05, 2024  8:10 AM Mia F wrote: Red Word that prompted transfer to Nurse Triage: Severe abdominal pain with irregular stools. Has been going on since last weekend. Doesnt hurt right away he says he went to the gym and the following day he began having this problem. When sitting down he feels a pain in his stomach. Reason for Disposition  [1] MODERATE pain (e.g., interferes with normal activities) AND [2] pain comes and goes (cramps) AND [3] present > 24 hours  (Exception: Pain with Vomiting or Diarrhea - see that Guideline.)  Answer Assessment - Initial Assessment Questions 1. LOCATION: Where does it hurt?      Abdominal pain, left to right  2. RADIATION: Does the pain shoot anywhere else? (e.g., chest, back)     no      3. ONSET: When did the pain begin? (Minutes, hours or days ago)      Friday  4. SUDDEN: Gradual or sudden onset?     Gradual  5. PATTERN Does the pain come and go, or is it constant?     Comes and goes  6. SEVERITY: How bad is the pain?  (e.g., Scale 1-10; mild, moderate, or severe)     8  7. RECURRENT SYMPTOM: Have you ever had this type of stomach pain before? If Yes, ask: When was the last time? and What happened that time?      Has been going on and off for 1 1/2 years and usually resolves itself; worse than usual though  8. CAUSE: What do you think is causing the stomach pain? (e.g., gallstones, recent abdominal surgery)      Doesn't know  9. RELIEVING/AGGRAVATING FACTORS: What makes it better or worse? (e.g., antacids, bending or twisting motion, bowel movement)    Having a bowel movement, moving. Tylenol  helps.  10. OTHER SYMPTOMS: Do you have any other symptoms? (e.g., back pain, diarrhea, fever, urination pain, vomiting)       Liquid Diarrhea 5X a day with pain to rectum, since Friday, gas. Denies bloody stools, urinary symptoms  Protocols used: Abdominal Pain - Male-A-AH

## 2024-10-05 NOTE — Progress Notes (Signed)
 Established Patient Office Visit  Patient ID: Luis Strong, male    DOB: 1946/03/31  Age: 78 y.o. MRN: [id number] PCP: Molina Hollenback K, MD  No chief complaint on file.   Subjective:     HPI  Discussed the use of AI scribe software for clinical note transcription with the patient, who gave verbal consent to proceed.  History of Present Illness Luis Strong is a 78 year old male who presents with lower abdominal pain and diarrhea following exercise.  He experienced lower abdominal pain and diarrhea after exercising at the gym last week. The pain is located under the umbilicus and is exacerbated by sitting down quickly. He describes the pain as severe, akin to 'somebody's taking a knife and just moved it in there.' Similar episodes have occurred in the past with exercises stressing his abdominal muscles, but this episode is the most severe.  Diarrhea began yesterday, with six episodes of explosive, liquid stools. The pain temporarily subsides for 30 to 45 minutes after using the bathroom but then returns. He also reports significant gas accompanying the diarrhea. No blood in the stool or urine, and no fever has been noted.  His dietary habits include eating primarily at home, focusing on vegetables and avoiding spicy foods. He had a hamburger at [employer] last week. He had his gallbladder removed several years ago.  He has a history of kidney problems and is under the care of a nephrologist who advises him to drink a lot of water . He has been monitoring for blood in his stool due to concern about his symptoms.    Review of Systems  All other systems reviewed and are negative.     Objective:     There were no vitals taken for this visit.    Physical Exam Vitals and nursing note reviewed.  Constitutional:      Appearance: Normal appearance.  HENT:     Head: Normocephalic.     Right Ear: External ear normal.     Left Ear: External ear normal.  Eyes:      Conjunctiva/sclera: Conjunctivae normal.  Cardiovascular:     Rate and Rhythm: Normal rate.  Pulmonary:     Effort: Pulmonary effort is normal. No respiratory distress.  Abdominal:     General: There is distension.     Palpations: Abdomen is soft.     Tenderness: There is abdominal tenderness in the right lower quadrant.  Musculoskeletal:        General: Normal range of motion.  Skin:    General: Skin is warm.  Neurological:     Mental Status: He is alert and oriented to person, place, and time.  Psychiatric:        Mood and Affect: Mood normal.     Physical Exam     No results found for any visits on 10/05/24.     The ASCVD Risk score (Arnett DK, et al., 2019) failed to calculate for the following reasons:   Risk score cannot be calculated because patient has a medical history suggesting prior/existing ASCVD    Assessment & Plan:   Problem List Items Addressed This Visit   None   Assessment and Plan Assessment & Plan Acute lower abdominal pain with diarrhea Acute lower abdominal pain and diarrhea post-exercise. Recent fatty food and bourbon consumption may contribute. - Ordered stool culture for infection evaluation. - Prescribed antibiotics for potential infection. - Prescribed Bentyl for cramps. - Advised hydration and dietary modifications to avoid  fatty foods. - Instructed to monitor for fever, chills, hematochezia, and worsening pain. - Consider GI referral if no improvement.    No follow-ups on file.    Vinary K Saadiq Poche, MD [facility] at [facility]

## 2024-10-06 DIAGNOSIS — R197 Diarrhea, unspecified: Secondary | ICD-10-CM | POA: Diagnosis not present

## 2024-10-06 DIAGNOSIS — R1030 Lower abdominal pain, unspecified: Secondary | ICD-10-CM | POA: Diagnosis not present

## 2024-10-06 LAB — CBC WITH DIFFERENTIAL/PLATELET
Basophils Absolute: 0 x10E3/uL (ref 0.0–0.2)
Basos: 0 %
EOS (ABSOLUTE): 0.2 x10E3/uL (ref 0.0–0.4)
Eos: 1 %
Hematocrit: 47.5 % (ref 37.5–51.0)
Hemoglobin: 15.5 g/dL (ref 13.0–17.7)
Immature Grans (Abs): 0.1 x10E3/uL (ref 0.0–0.1)
Immature Granulocytes: 1 %
Lymphocytes Absolute: 1.2 x10E3/uL (ref 0.7–3.1)
Lymphs: 10 %
MCH: 28.8 pg (ref 26.6–33.0)
MCHC: 32.6 g/dL (ref 31.5–35.7)
MCV: 88 fL (ref 79–97)
Monocytes Absolute: 1.3 x10E3/uL — ABNORMAL HIGH (ref 0.1–0.9)
Monocytes: 10 %
Neutrophils Absolute: 10 x10E3/uL — ABNORMAL HIGH (ref 1.4–7.0)
Neutrophils: 78 %
Platelets: 185 x10E3/uL (ref 150–450)
RBC: 5.39 x10E6/uL (ref 4.14–5.80)
RDW: 13.5 % (ref 11.6–15.4)
WBC: 12.8 x10E3/uL — ABNORMAL HIGH (ref 3.4–10.8)

## 2024-10-06 LAB — COMPREHENSIVE METABOLIC PANEL WITH GFR
ALT: 16 IU/L (ref 0–44)
AST: 21 IU/L (ref 0–40)
Albumin: 4.3 g/dL (ref 3.8–4.8)
Alkaline Phosphatase: 60 IU/L (ref 47–123)
BUN/Creatinine Ratio: 10 (ref 10–24)
BUN: 17 mg/dL (ref 8–27)
Bilirubin Total: 1 mg/dL (ref 0.0–1.2)
CO2: 20 mmol/L (ref 20–29)
Calcium: 9.6 mg/dL (ref 8.6–10.2)
Chloride: 102 mmol/L (ref 96–106)
Creatinine, Ser: 1.64 mg/dL — ABNORMAL HIGH (ref 0.76–1.27)
Globulin, Total: 2.3 g/dL (ref 1.5–4.5)
Glucose: 116 mg/dL — ABNORMAL HIGH (ref 70–99)
Potassium: 4.5 mmol/L (ref 3.5–5.2)
Sodium: 137 mmol/L (ref 134–144)
Total Protein: 6.6 g/dL (ref 6.0–8.5)
eGFR: 42 mL/min/1.73 — ABNORMAL LOW (ref >59)

## 2024-10-06 LAB — LIPASE: Lipase: 17 U/L (ref 13–78)

## 2024-10-08 NOTE — Telephone Encounter (Signed)
 Duplicate request, refilled 10/05/24.  Requested Prescriptions  Pending Prescriptions Disp Refills   dicyclomine (BENTYL) 10 MG capsule [Pharmacy Med Name: DICYCLOMINE 10MG  CAPSULES] 360 capsule     Sig: TAKE 1 CAPSULE(10 MG) BY MOUTH FOUR TIMES DAILY BEFORE MEALS AND AT BEDTIME     Gastroenterology:  Antispasmodic Agents Passed - 10/08/2024 11:59 AM      Passed - Valid encounter within last 12 months    Recent Outpatient Visits           3 days ago Lower abdominal pain   [place] [facility] at [facility] Kotturi, Vinay K, MD   4 months ago Stage 3b chronic kidney disease [facility])   [place] [facility] at [facility], Vinay K, MD       Future Appointments             In 4 weeks Kotturi, Vinay K, MD [facility] at [facility], [phone] [place]

## 2024-10-10 LAB — CDIFF NAA+O+P+STOOL CULTURE
E coli, Shiga toxin Assay: NEGATIVE
Toxigenic C. Difficile by PCR: NEGATIVE

## 2024-11-04 DIAGNOSIS — I10 Essential (primary) hypertension: Secondary | ICD-10-CM | POA: Diagnosis not present

## 2024-11-04 DIAGNOSIS — N1832 Chronic kidney disease, stage 3b: Secondary | ICD-10-CM | POA: Diagnosis not present

## 2024-11-06 ENCOUNTER — Ambulatory Visit: Admitting: Family Medicine

## 2024-11-06 ENCOUNTER — Encounter: Payer: Self-pay | Admitting: Family Medicine

## 2024-11-06 VITALS — BP 120/70 | HR 56 | Ht 69.0 in | Wt 210.2 lb

## 2024-11-06 DIAGNOSIS — F3342 Major depressive disorder, recurrent, in full remission: Secondary | ICD-10-CM

## 2024-11-06 DIAGNOSIS — I10 Essential (primary) hypertension: Secondary | ICD-10-CM | POA: Diagnosis not present

## 2024-11-06 DIAGNOSIS — G4733 Obstructive sleep apnea (adult) (pediatric): Secondary | ICD-10-CM | POA: Diagnosis not present

## 2024-11-06 DIAGNOSIS — Z760 Encounter for issue of repeat prescription: Secondary | ICD-10-CM

## 2024-11-06 DIAGNOSIS — N1832 Chronic kidney disease, stage 3b: Secondary | ICD-10-CM | POA: Diagnosis not present

## 2024-11-06 MED ORDER — ROSUVASTATIN CALCIUM 20 MG PO TABS: 20.0000 mg | ORAL_TABLET | Freq: Every day | ORAL | 1 refills | Status: AC

## 2024-11-06 MED ORDER — METOPROLOL SUCCINATE ER 25 MG PO TB24: 25.0000 mg | ORAL_TABLET | Freq: Every day | ORAL | 3 refills | Status: AC

## 2024-11-06 MED ORDER — CITALOPRAM HYDROBROMIDE 20 MG PO TABS: 20.0000 mg | ORAL_TABLET | Freq: Every day | ORAL | 4 refills | Status: AC

## 2024-11-06 NOTE — Progress Notes (Signed)
 "  Established Patient Office Visit  Patient ID: Luis Strong, male    DOB: 08-07-1946  Age: 78 y.o. MRN: [id number] PCP: Dhana Totton K, MD  Chief Complaint  Patient presents with   Follow-up    6 Months follow up    Subjective:     HPI  Discussed the use of AI scribe software for clinical note transcription with the patient, who gave verbal consent to proceed.  History of Present Illness Luis Strong is a 78 year old male who presents for medication refills and routine follow-up.  He has not experienced any problems since his last visit. He manages his medication refills through [facility] and recently received a notification for a refill of metoprolol . His regular medication regimen includes Celexa  and rosuvastatin .  He has a history of high triglycerides and is monitoring his alcohol intake, having abstained from alcohol for about two weeks. He is drinking plenty of water  as per his nephrologist's recommendations.  He has a history of ulcers dating back to his time in [place] and is vigilant about monitoring for any signs of gastrointestinal bleeding, such as blood in the stool or upset stomach.  He uses a BiPAP machine every night without issues and regularly monitors his blood pressure, reporting no side effects from his medications.  He received a flu shot and a COVID-19 vaccine at [facility] in late September or early October.     Review of Systems  All other systems reviewed and are negative.     Objective:     BP 120/70   Pulse (!) 56   Ht 5' 9 (1.753 m)   Wt 210 lb 3.2 oz (95.3 kg)   SpO2 90%   BMI 31.04 kg/m  BP Readings from Last 3 Encounters:  11/06/24 120/70  10/05/24 110/60  09/14/24 121/71   Wt Readings from Last 3 Encounters:  11/06/24 210 lb 3.2 oz (95.3 kg)  10/05/24 208 lb (94.3 kg)  09/14/24 216 lb (98 kg)      Physical Exam Vitals and nursing note reviewed.  Constitutional:      Appearance: Normal  appearance.  HENT:     Head: Normocephalic.     Right Ear: External ear normal.     Left Ear: External ear normal.  Eyes:     Conjunctiva/sclera: Conjunctivae normal.  Cardiovascular:     Rate and Rhythm: Normal rate.  Pulmonary:     Effort: Pulmonary effort is normal. No respiratory distress.  Abdominal:     Palpations: Abdomen is soft.  Musculoskeletal:        General: Normal range of motion.  Skin:    General: Skin is warm.  Neurological:     Mental Status: He is alert and oriented to person, place, and time.  Psychiatric:        Mood and Affect: Mood normal.     Physical Exam VITALS: BP- 120/70   No results found for any visits on 11/06/24.     The ASCVD Risk score (Arnett DK, et al., 2019) failed to calculate for the following reasons:   Risk score cannot be calculated because patient has a medical history suggesting prior/existing ASCVD   * - Cholesterol units were assumed    Assessment & Plan:   Problem List Items Addressed This Visit       Cardiovascular and Mediastinum   HTN (hypertension)   Relevant Medications   metoprolol  succinate (TOPROL -XL) 25 MG 24 hr tablet   rosuvastatin  (CRESTOR )  20 MG tablet     Respiratory   OSA treated with BiPAP - Primary     Genitourinary   Stage 3b chronic kidney disease (HCC)     Other   Recurrent major depressive disorder, in full remission   Relevant Medications   citalopram  (CELEXA ) 20 MG tablet   Other Visit Diagnoses       Adjustment disorder with mixed anxiety and depressed mood       Relevant Medications   citalopram  (CELEXA ) 20 MG tablet     Essential hypertension       Relevant Medications   metoprolol  succinate (TOPROL -XL) 25 MG 24 hr tablet   rosuvastatin  (CRESTOR ) 20 MG tablet     Combined fat and carbohydrate induced hyperlipemia       Relevant Medications   metoprolol  succinate (TOPROL -XL) 25 MG 24 hr tablet   rosuvastatin  (CRESTOR ) 20 MG tablet       Assessment and Plan Assessment &  Plan Essential hypertension Blood pressure controlled at 120/70 mmHg with current medications. - Continue current antihypertensive regimen including metoprolol . - Refilled metoprolol  prescription.  Mixed hyperlipidemia Triglycerides previously elevated. Abstained from alcohol for two weeks. - Continue rosuvastatin  for cholesterol management. - Refilled rosuvastatin  prescription.  Stage 3b chronic kidney disease Under nephrologist care. Advised to avoid NSAIDs and aspirin . - Continue follow-up with nephrologist. - Avoid NSAIDs and aspirin .  Obstructive sleep apnea Uses BiPAP nightly without issues. - Continue BiPAP therapy.  History of peptic ulcer disease Advised to monitor for gastrointestinal bleeding or upset stomach. - Monitor for signs of gastrointestinal bleeding or upset stomach.  General health maintenance Received flu and COVID vaccinations in late September or early October.    No follow-ups on file.    Vinary K Novi Calia, MD [facility] at [facility]   "

## 2025-06-10 ENCOUNTER — Ambulatory Visit: Admitting: Family Medicine
